# Patient Record
Sex: Female | Born: 1935 | Race: White | Hispanic: No | Smoking: Former smoker
Health system: Southern US, Community
[De-identification: ages and names within clinical notes are randomized; demographics above are authoritative.]

## PROBLEM LIST (undated history)

## (undated) DIAGNOSIS — D539 Nutritional anemia, unspecified: Secondary | ICD-10-CM

## (undated) DIAGNOSIS — Z85820 Personal history of malignant melanoma of skin: Secondary | ICD-10-CM

## (undated) DIAGNOSIS — R569 Unspecified convulsions: Secondary | ICD-10-CM

## (undated) DIAGNOSIS — Z8673 Personal history of transient ischemic attack (TIA), and cerebral infarction without residual deficits: Secondary | ICD-10-CM

## (undated) DIAGNOSIS — G459 Transient cerebral ischemic attack, unspecified: Secondary | ICD-10-CM

## (undated) DIAGNOSIS — Z8589 Personal history of malignant neoplasm of other organs and systems: Secondary | ICD-10-CM

## (undated) DIAGNOSIS — I10 Essential (primary) hypertension: Secondary | ICD-10-CM

## (undated) DIAGNOSIS — M1990 Unspecified osteoarthritis, unspecified site: Secondary | ICD-10-CM

## (undated) DIAGNOSIS — I639 Cerebral infarction, unspecified: Secondary | ICD-10-CM

## (undated) DIAGNOSIS — E7849 Other hyperlipidemia: Secondary | ICD-10-CM

## (undated) HISTORY — DX: Unspecified convulsions: R56.9

## (undated) HISTORY — PX: DENTAL SURGERY: SHX609

## (undated) HISTORY — DX: Personal history of malignant neoplasm of other organs and systems: Z85.89

## (undated) HISTORY — DX: Other hyperlipidemia: E78.49

## (undated) HISTORY — DX: Unspecified osteoarthritis, unspecified site: M19.90

## (undated) HISTORY — PX: SQUAMOUS CELL CARCINOMA EXCISION: SHX2433

## (undated) HISTORY — DX: Personal history of transient ischemic attack (TIA), and cerebral infarction without residual deficits: Z86.73

## (undated) HISTORY — DX: Transient cerebral ischemic attack, unspecified: G45.9

## (undated) HISTORY — DX: Personal history of malignant melanoma of skin: Z85.820

## (undated) HISTORY — PX: CATARACT EXTRACTION: SUR2

## (undated) HISTORY — DX: Nutritional anemia, unspecified: D53.9

## (undated) HISTORY — DX: Cerebral infarction, unspecified: I63.9

---
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
## 2016-10-16 DIAGNOSIS — Z8589 Personal history of malignant neoplasm of other organs and systems: Secondary | ICD-10-CM | POA: Insufficient documentation

## 2017-08-24 DIAGNOSIS — M1990 Unspecified osteoarthritis, unspecified site: Secondary | ICD-10-CM | POA: Insufficient documentation

## 2017-08-24 DIAGNOSIS — Z634 Disappearance and death of family member: Secondary | ICD-10-CM | POA: Insufficient documentation

## 2017-08-24 DIAGNOSIS — IMO0002 Reserved for concepts with insufficient information to code with codable children: Secondary | ICD-10-CM | POA: Insufficient documentation

## 2017-08-24 LAB — HEPATIC FUNCTION PANEL
ALT: 31 (ref 7–35)
AST: 21 (ref 13–35)
Alkaline Phosphatase: 43 (ref 25–125)
Bilirubin, Total: 0.5

## 2017-08-24 LAB — CBC AND DIFFERENTIAL
HCT: 37 (ref 36–46)
Hemoglobin: 11.4 — AB (ref 12.0–16.0)
Platelets: 206 (ref 150–399)
WBC: 9.6

## 2017-08-24 LAB — BASIC METABOLIC PANEL
BUN: 19 (ref 4–21)
CO2: 27 — AB (ref 13–22)
Chloride: 105 (ref 99–108)
Creatinine: 1.2 — AB (ref 0.5–1.1)
Glucose: 85
Potassium: 4.5 (ref 3.4–5.3)
Sodium: 142 (ref 137–147)

## 2017-09-28 ENCOUNTER — Observation Stay (HOSPITAL_COMMUNITY)
Admission: EM | Admit: 2017-09-28 | Discharge: 2017-09-29 | Disposition: A | Discharge status: Home / Self Care | Payer: Medicare Other | Attending: Internal Medicine | Admitting: Internal Medicine

## 2017-09-28 ENCOUNTER — Emergency Department (HOSPITAL_COMMUNITY): Payer: Medicare Other

## 2017-09-28 ENCOUNTER — Other Ambulatory Visit: Payer: Self-pay

## 2017-09-28 ENCOUNTER — Encounter (HOSPITAL_COMMUNITY): Payer: Self-pay | Admitting: Emergency Medicine

## 2017-09-28 ENCOUNTER — Observation Stay (HOSPITAL_COMMUNITY): Payer: Medicare Other

## 2017-09-28 DIAGNOSIS — N1831 Chronic kidney disease, stage 3a: Secondary | ICD-10-CM | POA: Diagnosis present

## 2017-09-28 DIAGNOSIS — Z87891 Personal history of nicotine dependence: Secondary | ICD-10-CM | POA: Insufficient documentation

## 2017-09-28 DIAGNOSIS — R200 Anesthesia of skin: Secondary | ICD-10-CM | POA: Diagnosis present

## 2017-09-28 DIAGNOSIS — I10 Essential (primary) hypertension: Secondary | ICD-10-CM | POA: Diagnosis present

## 2017-09-28 DIAGNOSIS — Z79899 Other long term (current) drug therapy: Secondary | ICD-10-CM | POA: Insufficient documentation

## 2017-09-28 DIAGNOSIS — N183 Chronic kidney disease, stage 3 unspecified: Secondary | ICD-10-CM | POA: Diagnosis present

## 2017-09-28 DIAGNOSIS — G459 Transient cerebral ischemic attack, unspecified: Secondary | ICD-10-CM | POA: Diagnosis not present

## 2017-09-28 DIAGNOSIS — I129 Hypertensive chronic kidney disease with stage 1 through stage 4 chronic kidney disease, or unspecified chronic kidney disease: Secondary | ICD-10-CM | POA: Insufficient documentation

## 2017-09-28 HISTORY — DX: Essential (primary) hypertension: I10

## 2017-09-28 LAB — URINALYSIS, ROUTINE W REFLEX MICROSCOPIC
Bilirubin Urine: NEGATIVE
GLUCOSE, UA: NEGATIVE mg/dL
Hgb urine dipstick: NEGATIVE
Ketones, ur: NEGATIVE mg/dL
LEUKOCYTES UA: NEGATIVE
Nitrite: NEGATIVE
PH: 7 (ref 5.0–8.0)
Protein, ur: NEGATIVE mg/dL
Specific Gravity, Urine: 1.006 (ref 1.005–1.030)

## 2017-09-28 LAB — DIFFERENTIAL
Abs Immature Granulocytes: 0.1 10*3/uL (ref 0.0–0.1)
BASOS ABS: 0.1 10*3/uL (ref 0.0–0.1)
Basophils Relative: 1 %
EOS PCT: 4 %
Eosinophils Absolute: 0.3 10*3/uL (ref 0.0–0.7)
IMMATURE GRANULOCYTES: 1 %
LYMPHS PCT: 23 %
Lymphs Abs: 2.1 10*3/uL (ref 0.7–4.0)
Monocytes Absolute: 0.8 10*3/uL (ref 0.1–1.0)
Monocytes Relative: 8 %
Neutro Abs: 6.1 10*3/uL (ref 1.7–7.7)
Neutrophils Relative %: 63 %

## 2017-09-28 LAB — CBC
HCT: 34.3 % — ABNORMAL LOW (ref 36.0–46.0)
Hemoglobin: 10.8 g/dL — ABNORMAL LOW (ref 12.0–15.0)
MCH: 32 pg (ref 26.0–34.0)
MCHC: 31.5 g/dL (ref 30.0–36.0)
MCV: 101.5 fL — ABNORMAL HIGH (ref 78.0–100.0)
PLATELETS: 131 10*3/uL — AB (ref 150–400)
RBC: 3.38 MIL/uL — ABNORMAL LOW (ref 3.87–5.11)
RDW: 19.1 % — AB (ref 11.5–15.5)
WBC: 9.4 10*3/uL (ref 4.0–10.5)

## 2017-09-28 LAB — COMPREHENSIVE METABOLIC PANEL
ALT: 30 U/L (ref 0–44)
AST: 29 U/L (ref 15–41)
Albumin: 4.6 g/dL (ref 3.5–5.0)
Alkaline Phosphatase: 46 U/L (ref 38–126)
Anion gap: 10 (ref 5–15)
BILIRUBIN TOTAL: 0.8 mg/dL (ref 0.3–1.2)
BUN: 17 mg/dL (ref 8–23)
CALCIUM: 10.2 mg/dL (ref 8.9–10.3)
CO2: 27 mmol/L (ref 22–32)
Chloride: 97 mmol/L — ABNORMAL LOW (ref 98–111)
Creatinine, Ser: 1.09 mg/dL — ABNORMAL HIGH (ref 0.44–1.00)
GFR calc Af Amer: 52 mL/min — ABNORMAL LOW (ref >60)
GFR, EST NON AFRICAN AMERICAN: 45 mL/min — AB (ref >60)
Glucose, Bld: 109 mg/dL — ABNORMAL HIGH (ref 70–99)
Potassium: 4.7 mmol/L (ref 3.5–5.1)
Sodium: 134 mmol/L — ABNORMAL LOW (ref 135–145)
TOTAL PROTEIN: 7.4 g/dL (ref 6.5–8.1)

## 2017-09-28 LAB — RAPID URINE DRUG SCREEN, HOSP PERFORMED
Amphetamines: NOT DETECTED
BENZODIAZEPINES: NOT DETECTED
Cocaine: NOT DETECTED
Opiates: NOT DETECTED
TETRAHYDROCANNABINOL: NOT DETECTED

## 2017-09-28 LAB — I-STAT CHEM 8, ED
BUN: 24 mg/dL — ABNORMAL HIGH (ref 8–23)
CALCIUM ION: 1.04 mmol/L — AB (ref 1.15–1.40)
CREATININE: 0.9 mg/dL (ref 0.44–1.00)
Chloride: 102 mmol/L (ref 98–111)
GLUCOSE: 88 mg/dL (ref 70–99)
HCT: 31 % — ABNORMAL LOW (ref 36.0–46.0)
HEMOGLOBIN: 10.5 g/dL — AB (ref 12.0–15.0)
Potassium: 5.5 mmol/L — ABNORMAL HIGH (ref 3.5–5.1)
Sodium: 133 mmol/L — ABNORMAL LOW (ref 135–145)
TCO2: 23 mmol/L (ref 22–32)

## 2017-09-28 LAB — CBG MONITORING, ED: GLUCOSE-CAPILLARY: 95 mg/dL (ref 70–99)

## 2017-09-28 LAB — TSH: TSH: 0.533 u[IU]/mL (ref 0.350–4.500)

## 2017-09-28 LAB — APTT: aPTT: 36 seconds (ref 24–36)

## 2017-09-28 LAB — I-STAT TROPONIN, ED: TROPONIN I, POC: 0 ng/mL (ref 0.00–0.08)

## 2017-09-28 LAB — PROTIME-INR
INR: 1.03
Prothrombin Time: 13.5 seconds (ref 11.4–15.2)

## 2017-09-28 MED ORDER — ASPIRIN 325 MG PO TABS
325.0000 mg | ORAL_TABLET | Freq: Every day | ORAL | Status: DC
  Administered 2017-09-29: 325 mg via ORAL
  Filled 2017-09-28: qty 1

## 2017-09-28 MED ORDER — ACETAMINOPHEN 650 MG RE SUPP
650.0000 mg | RECTAL | Status: DC | PRN
Start: 2017-09-28 — End: 2017-09-29

## 2017-09-28 MED ORDER — STROKE: EARLY STAGES OF RECOVERY BOOK
Freq: Once | Status: AC
  Administered 2017-09-28: 20:00:00
  Filled 2017-09-28: qty 1

## 2017-09-28 MED ORDER — ATORVASTATIN CALCIUM 80 MG PO TABS
80.0000 mg | ORAL_TABLET | Freq: Every day | ORAL | Status: DC
  Administered 2017-09-28: 80 mg via ORAL
  Filled 2017-09-28: qty 1

## 2017-09-28 MED ORDER — ACETAMINOPHEN 160 MG/5ML PO SOLN: 650.0000 mg | ORAL | Status: DC | PRN

## 2017-09-28 MED ORDER — ACETAMINOPHEN 325 MG PO TABS: 650.0000 mg | ORAL_TABLET | ORAL | Status: DC | PRN

## 2017-09-28 MED ORDER — CLOPIDOGREL BISULFATE 75 MG PO TABS
75.0000 mg | ORAL_TABLET | Freq: Every day | ORAL | Status: DC
  Administered 2017-09-29: 75 mg via ORAL
  Filled 2017-09-28: qty 1

## 2017-09-28 MED ORDER — SENNOSIDES-DOCUSATE SODIUM 8.6-50 MG PO TABS: 1.0000 | ORAL_TABLET | Freq: Every evening | ORAL | Status: DC | PRN

## 2017-09-28 MED ORDER — CLOPIDOGREL BISULFATE 75 MG PO TABS
300.0000 mg | ORAL_TABLET | Freq: Once | ORAL | Status: AC
  Administered 2017-09-28: 300 mg via ORAL
  Filled 2017-09-28: qty 4

## 2017-09-28 MED ORDER — ENOXAPARIN SODIUM 30 MG/0.3ML ~~LOC~~ SOLN
30.0000 mg | SUBCUTANEOUS | Status: DC
  Administered 2017-09-28: 30 mg via SUBCUTANEOUS
  Filled 2017-09-28: qty 0.3

## 2017-09-28 MED ORDER — LORAZEPAM 2 MG/ML IJ SOLN
0.5000 mg | Freq: Once | INTRAMUSCULAR | Status: AC
  Administered 2017-09-28: 0.5 mg via INTRAVENOUS
  Filled 2017-09-28: qty 1

## 2017-09-28 MED ORDER — SODIUM CHLORIDE 0.9 % IV SOLN
INTRAVENOUS | Status: DC
  Administered 2017-09-28: 21:00:00 via INTRAVENOUS

## 2017-09-28 NOTE — Consult Note (Signed)
NEURO HOSPITALIST CONSULT NOTE   Requestig physician: Dr. Ellender Hose   Reason for Consult: right arm and hand numbness, slurred speech, right facial numbness   History obtained from:  Patient    HPI:                                                                                                                                          Sarah Hampton is an 84 y.o. female PMH of HTN and skin cancer (SCC) who presents to [facility] for right face and arm numbness, slurred speech.  Patient states that she has had right arm numbness off and on for several months.Usually the tingling is just in her pointer and middle finger, and there is some numbness of the hand. She is visiting her daughter and since Thursday has been having transient episodes of right arm numbness and  Hand tingling for quite some time. Friday she reports that her right arm was numb/ weak ( she felt as if she lost control of her arm for a minute) and she dropped a coffee cup. These episodes last about  15 minutes, and there is never any leg involvement.   This morning about 11:00am she had an episode where her entire arm was numb and her face was also numb. There was no tingling. This time she also had trouble speaking. Her daughter states that she noticed some slurred speech that has almost completely resolved. Denies any CP, SOB, vision problems, facial droop, dizziness, difficulty walking, HA, or falls. Denies starring spells, HA (except when the weather changes).  Patient stopped taking ASA in the past month at the recommendation of her PCP. Does state that she has been having right neck pain and stiffness since about Wednesday when she thinks she " over did it" at the [facility].  CT head:no acute abnormality. BG: 109, NA: 134, creatinine 1.09, GFR: 45, toxicology screen: negative.  1a Level of Conscious:0 1b LOC Questions: 0 1c LOC Commands: 0 2 Best Gaze: 0 3 Visual: 0 4 Facial Palsy: 0 5a Motor Arm - left:0  5b  Motor Arm - Right: 0 6a Motor Leg - Left: 0 6b Motor Leg - Right: 0 7 Limb Ataxia: 0 8 Sensory:0  9 Best Language: 0 10 Dysarthria:0 11 Extinct. and Inattention:0 TOTAL: 0   No previous stroke history  Past Medical History:  Diagnosis Date  . Hypertension     FHx: No hx stroke    Social History:  reports that she quit smoking about 49 years ago. Her smoking use included cigarettes. She does not have any smokeless tobacco history on file. She reports that she drank alcohol. She reports that she does not use drugs.  No Known Allergies  MEDICATIONS:  No current facility-administered medications for this encounter.    Current Outpatient Medications  Medication Sig Dispense Refill  . calcium-vitamin D (OSCAL WITH D) 500-200 MG-UNIT tablet Take 1 tablet by mouth daily with breakfast.    . lisinopril (PRINIVIL,ZESTRIL) 20 MG tablet Take 20 mg by mouth daily.    . Multiple Vitamins-Minerals (MULTIVITAL) tablet Take 1 tablet by mouth daily.        ROS:                                                                                                                                       History obtained from the patient  General ROS: negative for - chills, fatigue, fever, night sweats, weight gain or weight loss Ophthalmic ROS: negative for - blurry vision, double vision, eye pain or loss of vision Respiratory ROS: negative for - cough, hemoptysis, shortness of breath or wheezing Cardiovascular ROS: negative for - chest pain, dyspnea on exertion, edema or irregular heartbeat Gastrointestinal ROS: negative for - abdominal pain, diarrhea, hematemesis, nausea/vomiting or stool incontinence Musculoskeletal ROS: positive for - right arm numbness and tingling, questionable transient weakness Neurological ROS: as noted in HPI Dermatological ROS: negative for rash and skin  lesion changes   Blood pressure (!) 132/46, pulse 71, temperature 97.9 F (36.6 C), resp. rate (!) 21, height 4\' 11"  (1.499 m), weight 47.6 kg (105 lb), SpO2 99 %.   General Examination:                                                                                                       Physical Exam  HEENT-  Normocephalic, no lesions, without obvious abnormality.  Normal external eye and conjunctiva.   Cardiovascular- S1-S2 audible, pulses palpable throughout   Lungs-no rhonchi or wheezing noted, no excessive working breathing.  Saturations within normal limits on RA Extremities- Warm, dry and intact Musculoskeletal-no joint tenderness, deformity or swelling Skin-warm and dry, intact old scars on RLE from [facility] and skin graft.  Neurological Examination Mental Status: Alert, oriented, to person/place/month/year.  Speech fluent without evidence of aphasia.  Able to follow commands without difficulty. Cranial Nerves: II:  Visual fields grossly normal,  III,IV, VI: ptosis not present, extra-ocular motions intact bilaterally pupils equal, round, reactive to light and accommodation V,VII: smile symmetric, facial light touch sensation normal bilaterally VIII: hearing normal bilaterally IX,X: uvula rises symmetrically XI: bilateral shoulder shrug XII: midline tongue extension Motor: Right : Upper extremity   5/5    Left:  Upper extremity   5/5  Lower extremity   5/5     Lower extremity   5/5 Tone and bulk:normal tone throughout; no atrophy noted Of note patient gets muscle spasms in bilateral quadriceps muscles which cause her to drop her leg back to bed. Sensory:  light touch/ cool temp intact throughout, bilaterally Deep Tendon Reflexes: 2+ biceps, patallar Plantars: Right: downgoing   Left: downgoing Cerebellar: normal finger-to-nose, normal rapid alternating movements and normal heel-to-shin test Gait: deferred   Lab Results: Basic Metabolic Panel: Recent Labs  Lab  09/28/17 1238 09/28/17 1335  NA 133* 134*  K 5.5* 4.7  CL 102 97*  CO2  --  27  GLUCOSE 88 109*  BUN 24* 17  CREATININE 0.90 1.09*  CALCIUM  --  10.2    CBC: Recent Labs  Lab 09/28/17 1221 09/28/17 1238  WBC 9.4  --   NEUTROABS 6.1  --   HGB 10.8* 10.5*  HCT 34.3* 31.0*  MCV 101.5*  --   PLT 131*  --     Cardiac Enzymes: No results for input(s): CKTOTAL, CKMB, CKMBINDEX, TROPONINI in the last 168 hours.  Lipid Panel: No results for input(s): CHOL, TRIG, HDL, CHOLHDL, VLDL, LDLCALC in the last 168 hours.  Imaging: Ct Head Wo Contrast  Result Date: 09/28/2017 CLINICAL DATA:  Onset of right arm and hand numbness yesterday morning which resolved. The patient also had a 20 minutes episode of slurred speech yesterday morning. EXAM: CT HEAD WITHOUT CONTRAST CT CERVICAL SPINE WITHOUT CONTRAST TECHNIQUE: Multidetector CT imaging of the head and cervical spine was performed following the standard protocol without intravenous contrast. Multiplanar CT image reconstructions of the cervical spine were also generated. COMPARISON:  None. FINDINGS: CT HEAD FINDINGS Brain: No evidence of acute infarction, hemorrhage, hydrocephalus, extra-axial collection or mass lesion/mass effect. Chronic microvascular ischemic change is noted. Vascular: Atherosclerotic vascular disease is identified. Skull: Intact. Sinuses/Orbits: No acute finding. Status post bilateral lens extraction. Other: None. CT CERVICAL SPINE FINDINGS Alignment: Maintain with reversal of lordosis noted. Skull base and vertebrae: No acute fracture. No primary bone lesion or focal pathologic process. 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 Soft tissues and spinal canal: No prevertebral fluid or swelling. No visible canal hematoma. Disc levels: Marked loss of disc space height is seen from C2-C7. The patient has a capacious appearing central spinal canal. Scattered foraminal narrowing is noted. Upper chest:  Clear. Other: None. IMPRESSION: No acute abnormality head or cervical spine. Chronic microvascular ischemic change. Atherosclerosis. Cervical degenerative disease. Electronically Signed   By: Inge Rise M.D.   On: 09/28/2017 14:22   Ct Cervical Spine Wo Contrast  Result Date: 09/28/2017 CLINICAL DATA:  Onset of right arm and hand numbness yesterday morning which resolved. The patient also had a 20 minutes episode of slurred speech yesterday morning. EXAM: CT HEAD WITHOUT CONTRAST CT CERVICAL SPINE WITHOUT CONTRAST TECHNIQUE: Multidetector CT imaging of the head and cervical spine was performed following the standard protocol without intravenous contrast. Multiplanar CT image reconstructions of the cervical spine were also generated. COMPARISON:  None. FINDINGS: CT HEAD FINDINGS Brain: No evidence of acute infarction, hemorrhage, hydrocephalus, extra-axial collection or mass lesion/mass effect. Chronic microvascular ischemic change is noted. Vascular: Atherosclerotic vascular disease is identified. Skull: Intact. Sinuses/Orbits: No acute finding. Status post bilateral lens extraction. Other: None. CT CERVICAL SPINE FINDINGS Alignment: Maintain with reversal of lordosis noted. Skull base and vertebrae: No acute fracture. No primary bone lesion or focal  pathologic process. 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 Soft tissues and spinal canal: No prevertebral fluid or swelling. No visible canal hematoma. Disc levels: Marked loss of disc space height is seen from C2-C7. The patient has a capacious appearing central spinal canal. Scattered foraminal narrowing is noted. Upper chest: Clear. Other: None. IMPRESSION: No acute abnormality head or cervical spine. Chronic microvascular ischemic change. Atherosclerosis. Cervical degenerative disease. Electronically Signed   By: Inge Rise M.D.   On: 09/28/2017 14:22   Laurey Morale, MSN, NP-C [facility] [phone]  Attending neurologist's note to follow     Impression: Sarah Hampton is an 84 y.o. female with PMH of HTN who presents to [facility] for right face and arm numbness. I feel that this was a separate event form the intermittent tingling that has been longstanding and is concerning for carpal tunnel. The event today is most consistent with TIA and I would treat it as such.   Recommendations: -- BP goal : Permissive HTN up to 220/110 mmHg  --MRI Brain  --MRA of the head w/o and neck with contrast  --Echocardiogram --Load with Plavix 300 mg one time dose today --  Start 09/29/17 ASA 325 mg daily and Plavix 75 mg daily for 3 weeks, and then stop Plavix and continue ASA -- High intensity Statin for LDL > 70 -- HgbA1c, fasting lipid panel -- PT consult, OT consult, Speech consult --Telemetry monitoring --Frequent neuro checks --Stroke swallow screen (completed in ED) --please page stroke NP  Or  PA  Or MD from 8am -4 pm  as this patient from this time will be  followed by the stroke.   You can look them up on [email or web address]  Password [email or web address]   Roland Rack, MD [facility] [phone]  If 7pm- 7am, please page neurology on call as listed in [place].  09/28/2017, 3:51 PM

## 2017-09-28 NOTE — ED Notes (Signed)
Patient transported to CT 

## 2017-09-28 NOTE — ED Notes (Signed)
Pt back from CT

## 2017-09-28 NOTE — ED Provider Notes (Signed)
[facility]PART[facility] Provider Note   CSN: [id number] Arrival date & time: 09/28/17  1205     History   Chief Complaint Chief Complaint  Patient presents with  . Numbness    HPI Sarah Hampton is a 84 y.o. female.  HPI 84 year old female here with right sided facial numbness and difficulty speaking.  The patient states that over the last several weeks, she has had intermittent right arm numbness.  She has seen her doctor for this.  She recently moved to the area.  She states that earlier today, she woke and noticed numbness of her right arm.  She also had transient right sided facial numbness and slurred speech.  She felt like she was trying to say the right words, but that it was "coming out wrong."  She denies any associated visual changes.  No difficulty swallowing.  Symptoms are now largely resolved.  Her numbness has been coming and going, but her facial numbness and difficulty speaking is new.  No recent falls.  No headache.  No recent medication change.  No history of stroke.  Patient is amatory very independent at her baseline.  She is currently moving to the area to be placed in an independent living facility.  Past Medical History:  Diagnosis Date  . Hypertension     Patient Active Problem List   Diagnosis Date Noted  . TIA (transient ischemic attack) 09/28/2017  . Essential hypertension 09/28/2017  . CKD (chronic kidney disease), stage III ([place]) 09/28/2017    Past Surgical History:  Procedure Laterality Date  . SQUAMOUS CELL CARCINOMA EXCISION       OB History   None      Home Medications    Prior to Admission medications   Medication Sig Start Date End Date Taking? Authorizing Provider  calcium-vitamin D (OSCAL WITH D) 500-200 MG-UNIT tablet Take 1 tablet by mouth daily with breakfast.   Yes [provider]  lisinopril (PRINIVIL,ZESTRIL) 20 MG tablet Take 20 mg by mouth daily.   Yes [provider]  Multiple  Vitamins-Minerals (MULTIVITAL) tablet Take 1 tablet by mouth daily.   Yes [provider]    Family History Family History  Problem Relation Age of Onset  . Dementia Mother 51  . Pneumonia Father 80  . Stroke Neg Hx     Social History Social History   Tobacco Use  . Smoking status: Former Smoker    Packs/day: 0.50    Years: 20.00    Pack years: 10.00    Types: Cigarettes    Last attempt to quit: 09/28/1968    Years since quitting: 49.0  . Smokeless tobacco: Never Used  Substance Use Topics  . Alcohol use: Yes    Comment: rare  . Drug use: Never     Allergies   Patient has no known allergies.   Review of Systems Review of Systems  Constitutional: Negative for chills and fever.  HENT: Negative for congestion, rhinorrhea and sore throat.   Eyes: Negative for visual disturbance.  Respiratory: Negative for cough, shortness of breath and wheezing.   Cardiovascular: Negative for chest pain and leg swelling.  Gastrointestinal: Negative for abdominal pain, diarrhea, nausea and vomiting.  Genitourinary: Negative for dysuria, flank pain, vaginal bleeding and vaginal discharge.  Musculoskeletal: Negative for neck pain.  Skin: Negative for rash.  Allergic/Immunologic: Negative for immunocompromised state.  Neurological: Positive for facial asymmetry, speech difficulty and numbness. Negative for syncope and headaches.  Hematological: Does not bruise/bleed  easily.  All other systems reviewed and are negative.    Physical Exam Updated Vital Signs BP (!) 117/42 (BP Location: Right Arm)   Pulse 77   Temp 98 F (36.7 C) (Oral)   Resp 18   Ht 4\' 11"  (1.499 m)   Wt 47.6 kg (105 lb)   SpO2 98%   BMI 21.21 kg/m   Physical Exam  Constitutional: She is oriented to person, place, and time. She appears well-developed and well-nourished. No distress.  HENT:  Head: Normocephalic and atraumatic.  Eyes: Conjunctivae are normal.  Neck: Neck supple.  Cardiovascular:  Normal rate, regular rhythm and normal heart sounds. Exam reveals no friction rub.  No murmur heard. Pulmonary/Chest: Effort normal and breath sounds normal. No respiratory distress. She has no wheezes. She has no rales.  Abdominal: She exhibits no distension.  Musculoskeletal: She exhibits no edema.  Neurological: She is alert and oriented to person, place, and time. She exhibits normal muscle tone.  Skin: Skin is warm. Capillary refill takes less than 2 seconds.  Psychiatric: She has a normal mood and affect.  Nursing note and vitals reviewed.   Neurological Exam:  Mental Status: Alert and oriented to person, place, and time. Attention and concentration normal. Speech clear. Recent memory is intact. Cranial Nerves: Visual fields grossly intact. EOMI and PERRLA. No nystagmus noted. Facial sensation intact at forehead, maxillary cheek, and chin/mandible bilaterally. No facial asymmetry or weakness. Hearing grossly normal. Uvula is midline, and palate elevates symmetrically. Normal SCM and trapezius strength. Tongue midline without fasciculations. Motor: Muscle strength 5/5 in proximal and distal UE and LE bilaterally. No pronator drift. Muscle tone normal. Reflexes: 2+ and symmetrical in all four extremities.  Sensation: Intact to light touch in upper and lower extremities distally bilaterally.  Gait: Normal without ataxia. Coordination: Normal FTN bilaterally.     ED Treatments / Results  Labs (all labs ordered are listed, but only abnormal results are displayed) Labs Reviewed  CBC - Abnormal; Notable for the following components:      Result Value   RBC 3.38 (*)    Hemoglobin 10.8 (*)    HCT 34.3 (*)    MCV 101.5 (*)    RDW 19.1 (*)    Platelets 131 (*)    All other components within normal limits  RAPID URINE DRUG SCREEN, HOSP PERFORMED - Abnormal; Notable for the following components:   Barbiturates   (*)    Value: Result not available. Reagent lot number recalled by  manufacturer.   All other components within normal limits  URINALYSIS, ROUTINE W REFLEX MICROSCOPIC - Abnormal; Notable for the following components:   APPearance HAZY (*)    All other components within normal limits  COMPREHENSIVE METABOLIC PANEL - Abnormal; Notable for the following components:   Sodium 134 (*)    Chloride 97 (*)    Glucose, Bld 109 (*)    Creatinine, Ser 1.09 (*)    GFR calc non Af Amer 45 (*)    GFR calc Af Amer 52 (*)    All other components within normal limits  I-STAT CHEM 8, ED - Abnormal; Notable for the following components:   Sodium 133 (*)    Potassium 5.5 (*)    BUN 24 (*)    Calcium, Ion 1.04 (*)    Hemoglobin 10.5 (*)    HCT 31.0 (*)    All other components within normal limits  DIFFERENTIAL  PROTIME-INR  APTT  TSH  HEMOGLOBIN A1C  LIPID PANEL  CBG MONITORING, ED  I-STAT TROPONIN, ED    EKG EKG Interpretation  Date/Time:  Tuesday September 28 2017 12:16:12 EDT Ventricular Rate:  72 PR Interval:    QRS Duration: 87 QT Interval:  382 QTC Calculation: 418 R Axis:   57 Text Interpretation:  Sinus rhythm Consider left ventricular hypertrophy No old tracing to compare Confirmed by Duffy Bruce [phone]) on 09/28/2017 12:58:43 PM   Radiology Ct Head Wo Contrast  Result Date: 09/28/2017 CLINICAL DATA:  Onset of right arm and hand numbness yesterday morning which resolved. The patient also had a 20 minutes episode of slurred speech yesterday morning. EXAM: CT HEAD WITHOUT CONTRAST CT CERVICAL SPINE WITHOUT CONTRAST TECHNIQUE: Multidetector CT imaging of the head and cervical spine was performed following the standard protocol without intravenous contrast. Multiplanar CT image reconstructions of the cervical spine were also generated. COMPARISON:  None. FINDINGS: CT HEAD FINDINGS Brain: No evidence of acute infarction, hemorrhage, hydrocephalus, extra-axial collection or mass lesion/mass effect. Chronic microvascular ischemic change is noted. Vascular:  Atherosclerotic vascular disease is identified. Skull: Intact. Sinuses/Orbits: No acute finding. Status post bilateral lens extraction. Other: None. CT CERVICAL SPINE FINDINGS Alignment: Maintain with reversal of lordosis noted. Skull base and vertebrae: No acute fracture. No primary bone lesion or focal pathologic process. 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 Soft tissues and spinal canal: No prevertebral fluid or swelling. No visible canal hematoma. Disc levels: Marked loss of disc space height is seen from C2-C7. The patient has a capacious appearing central spinal canal. Scattered foraminal narrowing is noted. Upper chest: Clear. Other: None. IMPRESSION: No acute abnormality head or cervical spine. Chronic microvascular ischemic change. Atherosclerosis. Cervical degenerative disease. Electronically Signed   By: Inge Rise M.D.   On: 09/28/2017 14:22   Ct Cervical Spine Wo Contrast  Result Date: 09/28/2017 CLINICAL DATA:  Onset of right arm and hand numbness yesterday morning which resolved. The patient also had a 20 minutes episode of slurred speech yesterday morning. EXAM: CT HEAD WITHOUT CONTRAST CT CERVICAL SPINE WITHOUT CONTRAST TECHNIQUE: Multidetector CT imaging of the head and cervical spine was performed following the standard protocol without intravenous contrast. Multiplanar CT image reconstructions of the cervical spine were also generated. COMPARISON:  None. FINDINGS: CT HEAD FINDINGS Brain: No evidence of acute infarction, hemorrhage, hydrocephalus, extra-axial collection or mass lesion/mass effect. Chronic microvascular ischemic change is noted. Vascular: Atherosclerotic vascular disease is identified. Skull: Intact. Sinuses/Orbits: No acute finding. Status post bilateral lens extraction. Other: None. CT CERVICAL SPINE FINDINGS Alignment: Maintain with reversal of lordosis noted. Skull base and vertebrae: No acute fracture. No primary bone lesion or  focal pathologic process. 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 Soft tissues and spinal canal: No prevertebral fluid or swelling. No visible canal hematoma. Disc levels: Marked loss of disc space height is seen from C2-C7. The patient has a capacious appearing central spinal canal. Scattered foraminal narrowing is noted. Upper chest: Clear. Other: None. IMPRESSION: No acute abnormality head or cervical spine. Chronic microvascular ischemic change. Atherosclerosis. Cervical degenerative disease. Electronically Signed   By: Inge Rise M.D.   On: 09/28/2017 14:22   Mr Brain Wo Contrast  Result Date: 09/28/2017 CLINICAL DATA:  84 y/o F; episode of right arm and face numbness with difficulty speaking. EXAM: MRI HEAD WITHOUT CONTRAST MRA HEAD WITHOUT CONTRAST TECHNIQUE: Multiplanar, multiecho pulse sequences of the brain and surrounding structures were obtained without intravenous contrast. Angiographic images of the  head were obtained using MRA technique without contrast. COMPARISON:  09/28/2016 CT head. FINDINGS: MRI HEAD FINDINGS Brain: No acute infarction, hemorrhage, hydrocephalus, extra-axial collection or mass lesion. Numerous nonspecific foci of T2 FLAIR hyperintense signal abnormality in subcortical and periventricular white matter are compatible with moderate chronic microvascular ischemic changes for age. Moderate brain parenchymal volume loss. Vascular: Normal flow voids. Skull and upper cervical spine: Normal marrow signal. Sinuses/Orbits: Negative. 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 MRA HEAD FINDINGS Internal carotid arteries: Patent. Irregularity of the carotid siphons bilaterally compatible with atherosclerotic disease. Bilateral cavernous segments are ectatic to 6 mm. Anterior cerebral arteries:  Patent. Middle cerebral arteries: Patent. Anterior communicating artery: Patent. Posterior communicating arteries:  Patent. Posterior  cerebral arteries:  Patent. Basilar artery:  Patent. Vertebral arteries:  Patent. No evidence of high-grade stenosis, large vessel occlusion, or aneurysm identified. IMPRESSION: MRI head: 1. No acute intracranial abnormality identified. 2. Moderate chronic microvascular ischemic changes and parenchymal volume loss of the brain. MRA head: 1. Patent anterior and posterior intracranial circulation. No large vessel occlusion, high-grade stenosis, or aneurysm. 2. Atherosclerotic irregularity of carotid siphons with mild ectasia of cavernous segments. Electronically Signed   By: Kristine Garbe M.D.   On: 09/28/2017 21:07   Mr Jodene Nam Head Wo Contrast  Result Date: 09/28/2017 CLINICAL DATA:  84 y/o F; episode of right arm and face numbness with difficulty speaking. EXAM: MRI HEAD WITHOUT CONTRAST MRA HEAD WITHOUT CONTRAST TECHNIQUE: Multiplanar, multiecho pulse sequences of the brain and surrounding structures were obtained without intravenous contrast. Angiographic images of the head were obtained using MRA technique without contrast. COMPARISON:  09/28/2016 CT head. FINDINGS: MRI HEAD FINDINGS Brain: No acute infarction, hemorrhage, hydrocephalus, extra-axial collection or mass lesion. Numerous nonspecific foci of T2 FLAIR hyperintense signal abnormality in subcortical and periventricular white matter are compatible with moderate chronic microvascular ischemic changes for age. Moderate brain parenchymal volume loss. Vascular: Normal flow voids. Skull and upper cervical spine: Normal marrow signal. Sinuses/Orbits: Negative. 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 MRA HEAD FINDINGS Internal carotid arteries: Patent. Irregularity of the carotid siphons bilaterally compatible with atherosclerotic disease. Bilateral cavernous segments are ectatic to 6 mm. Anterior cerebral arteries:  Patent. Middle cerebral arteries: Patent. Anterior communicating artery: Patent. Posterior communicating  arteries:  Patent. Posterior cerebral arteries:  Patent. Basilar artery:  Patent. Vertebral arteries:  Patent. No evidence of high-grade stenosis, large vessel occlusion, or aneurysm identified. IMPRESSION: MRI head: 1. No acute intracranial abnormality identified. 2. Moderate chronic microvascular ischemic changes and parenchymal volume loss of the brain. MRA head: 1. Patent anterior and posterior intracranial circulation. No large vessel occlusion, high-grade stenosis, or aneurysm. 2. Atherosclerotic irregularity of carotid siphons with mild ectasia of cavernous segments. Electronically Signed   By: Kristine Garbe M.D.   On: 09/28/2017 21:07    Procedures Procedures (including critical care time)  Medications Ordered in ED Medications  clopidogrel (PLAVIX) tablet 75 mg (has no administration in time range)  aspirin tablet 325 mg (has no administration in time range)  enoxaparin (LOVENOX) injection 30 mg (30 mg Subcutaneous Given 09/28/17 2117)  0.9 %  sodium chloride infusion ( Intravenous New Bag/Given 09/28/17 2112)  acetaminophen (TYLENOL) tablet 650 mg (has no administration in time range)    Or  acetaminophen (TYLENOL) solution 650 mg (has no administration in time range)    Or  acetaminophen (TYLENOL) suppository 650 mg (has no administration in time range)  senna-docusate (Senokot-S) tablet 1 tablet (has no administration  in time range)  atorvastatin (LIPITOR) tablet 80 mg (80 mg Oral Given 09/28/17 2114)  clopidogrel (PLAVIX) tablet 300 mg (300 mg Oral Given 09/28/17 1942)   stroke: mapping our early stages of recovery book ( Does not apply Given 09/28/17 1952)  LORazepam (ATIVAN) injection 0.5 mg (0.5 mg Intravenous Given 09/28/17 1945)     Initial Impression / Assessment and Plan / ED Course  I have reviewed the triage vital signs and the nursing notes.  Pertinent labs & imaging results that were available during my care of the patient were reviewed by me and considered in my  medical decision making (see chart for details).     84 year old female here with transient right facial numbness, difficulty speaking, and right arm numbness.  Clinically, concern for possible TIA.  Her right arm numbness, however, has been coming and going I suspect this more so secondary to cervical radiculopathy.  CT head negative and CT C-spine does show degenerative changes, but this would not explain her difficulty speaking and facial numbness.  Discussed with neurology Dr. Leonel Ramsay, will admit for TIA work-up.  Final Clinical Impressions(s) / ED Diagnoses   Final diagnoses:  TIA (transient ischemic attack)    ED Discharge Orders    None       Duffy Bruce, MD 09/28/17 2330

## 2017-09-28 NOTE — ED Triage Notes (Signed)
Patient arrived from home. Complaints of incident of right arm numbness occurring yesterday morning which resolve. Symptoms of right arm numbness with right hand numbness returned. Also endorses slurred speech. Incident last 20 minutes. Denies headache or chest pain. History of hypertension.

## 2017-09-28 NOTE — ED Notes (Addendum)
Pt assisted to restroom x1 person assist

## 2017-09-28 NOTE — ED Notes (Signed)
Patient up to bathroom

## 2017-09-28 NOTE — ED Notes (Signed)
Kirkpatrick, MD at bedside.  

## 2017-09-28 NOTE — ED Notes (Addendum)
ED Provider at bedside. Hospitalist [facility]

## 2017-09-28 NOTE — Progress Notes (Signed)
Pt admitted to 3W34 at this time.  Alert and oriented.  Daughter at bedside.  Denies any pain.  Per patient and daughter, right arm and hand numbness and slurred speech has resolved.  Placed on telemetry box 25. CCMD called and verified.  Bed alarm set and call bell within reach.  Pt verbalizes understanding to call before attempting to get out of bed.  Daughter states she will take home patient's hearing aids, cell phone and all other valuables. Will leave her eyeglasses with patient.

## 2017-09-28 NOTE — H&P (Signed)
History and Physical    Sarah Hampton AYT:[id number] DOB: [birth year] DOA: 09/28/2017  PCP:   Philipp Ovens,  NP - lives in [place] Consultants:  None Patient coming from: Home - lives in [place]; she may be moving here in [employer], very active; NOK:  Daughter, [phone]  Chief Complaint:  Neurologic symptoms  HPI: Sarah Hampton is a 84 y.o. female with medical history significant of HTN presenting with transient neurologic symptoms.  She has chronic R 4/5 finger numbness.  Today, her daughter was leaving the house and the patient asked her to bring her to the hospital.  She complained of numbness in her right arm and right face and difficulty speaking.  Her speech disturbance may still be a bit present but is mild.  She has had tingling in her fingers over a couple of months.  She did feel a neck/shoulder strain last week after exercising.  She dropped a coffee cup on Saturday after going to the [facility] on Friday.  She did not feel dizzy and was able to walk.  Symptoms lasted less than 5 minutes today.   ED Course:  TIA work-up.  Intermittent R arm numbness, chronic.  Today with speech difficulty, resolved.  Independent.  Neurology to see.  Suggest TIA eval.  Review of Systems: As per HPI; otherwise review of systems reviewed and negative.   Ambulatory Status:  Ambulates without assistance  Past Medical History:  Diagnosis Date  . Hypertension     Past Surgical History:  Procedure Laterality Date  . SQUAMOUS CELL CARCINOMA EXCISION      Social History   Socioeconomic History  . Marital status: Single    Spouse name: Not on file  . Number of children: Not on file  . Years of education: Not on file  . Highest education level: Not on file  Occupational History  . Occupation: retired  Scientific laboratory technician  . Financial resource strain: Not on file  . Food insecurity:    Worry: Not on file    Inability: Not on file  . Transportation needs:    Medical: Not on file   Non-medical: Not on file  Tobacco Use  . Smoking status: Former Smoker    Packs/day: 0.50    Years: 20.00    Pack years: 10.00    Types: Cigarettes    Last attempt to quit: 09/28/1968    Years since quitting: 49.0  . Smokeless tobacco: Never Used  Substance and Sexual Activity  . Alcohol use: Yes    Comment: rare  . Drug use: Never  . Sexual activity: Not on file  Lifestyle  . Physical activity:    Days per week: Not on file    Minutes per session: Not on file  . Stress: Not on file  Relationships  . Social connections:    Talks on phone: Not on file    Gets together: Not on file    Attends religious service: Not on file    Active member of club or organization: Not on file    Attends meetings of clubs or organizations: Not on file    Relationship status: Not on file  . Intimate partner violence:    Fear of current or ex partner: Not on file    Emotionally abused: Not on file    Physically abused: Not on file    Forced sexual activity: Not on file  Other Topics Concern  . Not on file  Social History Narrative  . Not on file  No Known Allergies  Family History  Problem Relation Age of Onset  . Dementia Mother 57  . Pneumonia Father 46  . Stroke Neg Hx     Prior to Admission medications   Medication Sig Start Date End Date Taking? Authorizing Provider  calcium-vitamin D (OSCAL WITH D) 500-200 MG-UNIT tablet Take 1 tablet by mouth daily with breakfast.   Yes [provider]  lisinopril (PRINIVIL,ZESTRIL) 20 MG tablet Take 20 mg by mouth daily.   Yes [provider]  Multiple Vitamins-Minerals (MULTIVITAL) tablet Take 1 tablet by mouth daily.   Yes [provider]    Physical Exam: Vitals:   09/28/17 1630 09/28/17 1634 09/28/17 1645 09/28/17 1839  BP: (!) 153/57 (!) 143/53 (!) 149/52 (!) 147/69  Pulse: 75 78 78 71  Resp: 20 17 18 16   Temp:    98.5 F (36.9 C)  TempSrc:    Oral  SpO2: 100% 98% 100% 100%  Weight:      Height:           General: Appears calm and comfortable and is NAD Eyes:  PERRL, EOMI, normal lids, iris ENT:  grossly normal hearing, lips & tongue, mmm Neck:  no LAD, masses or thyromegaly; no carotid bruits Cardiovascular:  RRR, no m/r/g. No LE edema.  Respiratory:   CTA bilaterally with no wheezes/rales/rhonchi.  Normal respiratory effort. Abdomen:  soft, NT, ND, NABS Back:   normal alignment, no CVAT Skin:  no rash or induration seen on limited exam Musculoskeletal:  grossly normal tone BUE/BLE, good ROM, no bony abnormality Lower extremity:  No LE edema.  Limited foot exam with no ulcerations.  2+ distal pulses. Psychiatric:  grossly normal mood and affect, speech fluent and appropriate, AOx3 Neurologic:  CN 2-12 grossly intact, moves all extremities in coordinated fashion, sensation intact    Radiological Exams on Admission: Ct Head Wo Contrast  Result Date: 09/28/2017 CLINICAL DATA:  Onset of right arm and hand numbness yesterday morning which resolved. The patient also had a 20 minutes episode of slurred speech yesterday morning. EXAM: CT HEAD WITHOUT CONTRAST CT CERVICAL SPINE WITHOUT CONTRAST TECHNIQUE: Multidetector CT imaging of the head and cervical spine was performed following the standard protocol without intravenous contrast. Multiplanar CT image reconstructions of the cervical spine were also generated. COMPARISON:  None. FINDINGS: CT HEAD FINDINGS Brain: No evidence of acute infarction, hemorrhage, hydrocephalus, extra-axial collection or mass lesion/mass effect. Chronic microvascular ischemic change is noted. Vascular: Atherosclerotic vascular disease is identified. Skull: Intact. Sinuses/Orbits: No acute finding. Status post bilateral lens extraction. Other: None. CT CERVICAL SPINE FINDINGS Alignment: Maintain with reversal of lordosis noted. Skull base and vertebrae: No acute fracture. No primary bone lesion or focal pathologic process. 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 Soft tissues and spinal canal: No prevertebral fluid or swelling. No visible canal hematoma. Disc levels: Marked loss of disc space height is seen from C2-C7. The patient has a capacious appearing central spinal canal. Scattered foraminal narrowing is noted. Upper chest: Clear. Other: None. IMPRESSION: No acute abnormality head or cervical spine. Chronic microvascular ischemic change. Atherosclerosis. Cervical degenerative disease. Electronically Signed   By: Inge Rise M.D.   On: 09/28/2017 14:22   Ct Cervical Spine Wo Contrast  Result Date: 09/28/2017 CLINICAL DATA:  Onset of right arm and hand numbness yesterday morning which resolved. The patient also had a 20 minutes episode of slurred speech yesterday morning. EXAM: CT HEAD WITHOUT CONTRAST CT CERVICAL  SPINE WITHOUT CONTRAST TECHNIQUE: Multidetector CT imaging of the head and cervical spine was performed following the standard protocol without intravenous contrast. Multiplanar CT image reconstructions of the cervical spine were also generated. COMPARISON:  None. FINDINGS: CT HEAD FINDINGS Brain: No evidence of acute infarction, hemorrhage, hydrocephalus, extra-axial collection or mass lesion/mass effect. Chronic microvascular ischemic change is noted. Vascular: Atherosclerotic vascular disease is identified. Skull: Intact. Sinuses/Orbits: No acute finding. Status post bilateral lens extraction. Other: None. CT CERVICAL SPINE FINDINGS Alignment: Maintain with reversal of lordosis noted. Skull base and vertebrae: No acute fracture. No primary bone lesion or focal pathologic process. 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 Soft tissues and spinal canal: No prevertebral fluid or swelling. No visible canal hematoma. Disc levels: Marked loss of disc space height is seen from C2-C7. The patient has a capacious appearing central spinal canal. Scattered foraminal narrowing is noted. Upper chest:  Clear. Other: None. IMPRESSION: No acute abnormality head or cervical spine. Chronic microvascular ischemic change. Atherosclerosis. Cervical degenerative disease. Electronically Signed   By: Inge Rise M.D.   On: 09/28/2017 14:22    EKG: Independently reviewed.  NSR with rate 72; possible LVH with no evidence of acute ischemia   Labs on Admission: I have personally reviewed the available labs and imaging studies at the time of the admission.  Pertinent labs:   Na++ 134 Glucose 109 BUN 17/Creatinine 1.09/GFR 45 Hgb 10.8 Platelets 131 UA WNL UDS negative  Assessment/Plan Principal Problem:   TIA (transient ischemic attack) Active Problems:   Essential hypertension   CKD (chronic kidney disease), stage III (HCC)   TIA -Patient with only h/o HTN who is extremely independent presenting with transient neurologic symptoms, unilateral, concerning for TIA -Will place in observation status for CVA/TIA evaluation -Telemetry monitoring -MRI/MRA -Carotid dopplers -Echo -Risk stratification with FLP, A1c; will also check TSH  -Neurology consult -She has not been taking ASA daily, as it was stopped by her doctor several weeks ago; will use DAPT for 3 weeks and then transition to ASA 81 mg daily -PT/OT/ST/Nutrition Consults -SW consult for assistance regarding interest in Independent Living facilities in the area -Check FLP; start empiric Lipitor 80 mg daily now  HTN -Allow permissive HTN -Treat BP only if >220/120, and then with goal of 15% reduction -Hold ACE and plan to restart in 65-78 hours  CKD -Uncertain baseline creatinine, but with long-standing HTN and use of ACE as monotherapy for BP control, suspect that this is chronic rather than acute -Will follow   DVT prophylaxis:  Lovenox  Code Status:  Full - confirmed with patient/family Family Communication: Daughter present throughout evaluation Disposition Plan:  Home once clinically improved Consults called:  Neurology; PT/OT/ST/Nutrition/SW  Admission status: It is my clinical opinion that referral for OBSERVATION is reasonable and necessary in this patient based on the above information provided. The aforementioned taken together are felt to place the patient at high risk for further clinical deterioration. However it is anticipated that the patient may be medically stable for discharge from the hospital within 24 to 48 hours.    Karmen Bongo MD [facility]  If note is complete, please contact covering daytime or nighttime physician. [email or web address] Password [email or web address]  09/28/2017, 6:50 PM

## 2017-09-28 NOTE — ED Notes (Signed)
Regular diet food tray ordered.

## 2017-09-28 NOTE — ED Notes (Signed)
Patient provided snack. Ok to eat per MD

## 2017-09-29 ENCOUNTER — Observation Stay (HOSPITAL_BASED_OUTPATIENT_CLINIC_OR_DEPARTMENT_OTHER): Payer: Medicare Other

## 2017-09-29 DIAGNOSIS — I10 Essential (primary) hypertension: Secondary | ICD-10-CM | POA: Diagnosis not present

## 2017-09-29 DIAGNOSIS — I340 Nonrheumatic mitral (valve) insufficiency: Secondary | ICD-10-CM

## 2017-09-29 DIAGNOSIS — G459 Transient cerebral ischemic attack, unspecified: Secondary | ICD-10-CM | POA: Diagnosis not present

## 2017-09-29 DIAGNOSIS — N183 Chronic kidney disease, stage 3 (moderate): Secondary | ICD-10-CM | POA: Diagnosis not present

## 2017-09-29 LAB — LIPID PANEL
CHOL/HDL RATIO: 3.1 ratio
Cholesterol: 154 mg/dL (ref 0–200)
HDL: 49 mg/dL (ref >40)
LDL Cholesterol: 87 mg/dL (ref 0–99)
Triglycerides: 92 mg/dL (ref <150)
VLDL: 18 mg/dL (ref 0–40)

## 2017-09-29 LAB — HEMOGLOBIN A1C
Hgb A1c MFr Bld: 5.2 % (ref 4.8–5.6)
MEAN PLASMA GLUCOSE: 103 mg/dL

## 2017-09-29 LAB — ECHOCARDIOGRAM COMPLETE
Height: 59 in
Weight: 1680 [oz_av]

## 2017-09-29 MED ORDER — CLOPIDOGREL BISULFATE 75 MG PO TABS: 75.0000 mg | ORAL_TABLET | Freq: Every day | ORAL | 0 refills | Status: DC

## 2017-09-29 MED ORDER — ATORVASTATIN CALCIUM 80 MG PO TABS: 80.0000 mg | ORAL_TABLET | Freq: Every day | ORAL | 1 refills | Status: DC

## 2017-09-29 MED ORDER — ASPIRIN EC 81 MG PO TBEC: 81.0000 mg | DELAYED_RELEASE_TABLET | Freq: Every day | ORAL | 1 refills | Status: AC

## 2017-09-29 NOTE — Progress Notes (Signed)
Pt being discharged from hospital per orders from MD. Pt educated on discharge instructions. Pt verbalized understanding of instructions. All questions and concerns were addressed. Pt's IV was removed prior to discharge. Pt exited hospital via wheelchair accompanied by staff. 

## 2017-09-29 NOTE — Progress Notes (Signed)
OT Cancellation Note and Discharge  Patient Details Name: Sarah Hampton MRN: [id number] DOB: Jan 29, 1931   Cancelled Treatment:    Reason Eval/Treat Not Completed: OT screened, no needs identified, will sign off. Symptoms have resolved, Pt and family with no questions or concerns. Educated on BE FAST.   [place] 09/29/2017, 3:27 PM  Hulda Humphrey OTR/L [phone]

## 2017-09-29 NOTE — Discharge Summary (Signed)
Physician Discharge Summary  Sarah Hampton DJS:[id number] DOB: 1930-04-11 DOA: 09/28/2017  PCP: Patient, No Pcp Per  Admit date: 09/28/2017 Discharge date: 09/29/2017  Admitted From: Home Disposition:  Home  Recommendations for Outpatient Follow-up:  1. Follow up with PCP in 3 weeks 2. Aspirin and Plavix for 3 weeks, followed by aspirin alone  Home Health: none  Equipment/Devices: None  Discharge Condition: Stable CODE STATUS: Full Diet recommendation: Heart healthy  HPI: Per Dr. Karmen Bongo Sarah Hampton is a 84 y.o. female with medical history significant of HTN presenting with transient neurologic symptoms.  She has chronic R 4/5 finger numbness.  Today, her daughter was leaving the house and the patient asked her to bring her to the hospital.  She complained of numbness in her right arm and right face and difficulty speaking.  Her speech disturbance may still be a bit present but is mild.  She has had tingling in her fingers over a couple of months.  She did feel a neck/shoulder strain last week after exercising.  She dropped a coffee cup on Saturday after going to the [employer] on Friday.  She did not feel dizzy and was able to walk.  Symptoms lasted less than 5 minutes today.  Hospital Course: TIA - 84 yo female presented to the ED 09/29/17.  Admitted to [facility] to rule out CVA. She underwent an MRI which did not show any acute abnormalities, moderate chronic microvascular changes. Neurology was consulted and followed patient while hospitalized.  She was monitored on telemetry and there were no events of A. fib.  She underwent a 2D echo which showed normal EF with grade 1 diastolic dysfunction.  Carotid duplex did not show any significant stenosis.  Lipid panel showed an LDL of was 87 and she was started on a statin.  Physical therapy evaluated patient did not recommend any PT follow-up.  She has returned to baseline, and will be discharged home in stable condition, was placed on dual  antiplatelet therapy with aspirin and Plavix per neurology recommendations for the next 3 weeks, and afterwards she is to continue aspirin alone. HTN -resume home lisinopril  Discharge Diagnoses:  Principal Problem:   TIA (transient ischemic attack) Active Problems:   Essential hypertension   CKD (chronic kidney disease), stage III ([place])   Discharge Instructions  Allergies as of 09/29/2017   No Known Allergies     Medication List    TAKE these medications   aspirin EC 81 MG tablet Take 1 tablet (81 mg total) by mouth daily.   atorvastatin 80 MG tablet Commonly known as:  LIPITOR Take 1 tablet (80 mg total) by mouth daily at 6 PM.   calcium-vitamin D 500-200 MG-UNIT tablet Commonly known as:  OSCAL WITH D Take 1 tablet by mouth daily with breakfast.   clopidogrel 75 MG tablet Commonly known as:  PLAVIX Take 1 tablet (75 mg total) by mouth daily. Start taking on:  09/30/2017   lisinopril 20 MG tablet Commonly known as:  PRINIVIL,ZESTRIL Take 20 mg by mouth daily.   MULTIVITAL tablet Take 1 tablet by mouth daily.        Consultations:  Neurology   Procedures/Studies:  2D echo  Impressions: - Diastolic dysfunction, normal EF, no evidence of intra-atrial flow by color doppler.  Ct Head Wo Contrast  Result Date: 09/28/2017 CLINICAL DATA:  Onset of right arm and hand numbness yesterday morning which resolved. The patient also had a 20 minutes episode of slurred speech yesterday morning. EXAM:  CT HEAD WITHOUT CONTRAST CT CERVICAL SPINE WITHOUT CONTRAST TECHNIQUE: Multidetector CT imaging of the head and cervical spine was performed following the standard protocol without intravenous contrast. Multiplanar CT image reconstructions of the cervical spine were also generated. COMPARISON:  None. FINDINGS: CT HEAD FINDINGS Brain: No evidence of acute infarction, hemorrhage, hydrocephalus, extra-axial collection or mass lesion/mass effect. Chronic microvascular ischemic change  is noted. Vascular: Atherosclerotic vascular disease is identified. Skull: Intact. Sinuses/Orbits: No acute finding. Status post bilateral lens extraction. Other: None. CT CERVICAL SPINE FINDINGS Alignment: Maintain with reversal of lordosis noted. Skull base and vertebrae: No acute fracture. No primary bone lesion or focal pathologic process. 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 Soft tissues and spinal canal: No prevertebral fluid or swelling. No visible canal hematoma. Disc levels: Marked loss of disc space height is seen from C2-C7. The patient has a capacious appearing central spinal canal. Scattered foraminal narrowing is noted. Upper chest: Clear. Other: None. IMPRESSION: No acute abnormality head or cervical spine. Chronic microvascular ischemic change. Atherosclerosis. Cervical degenerative disease. Electronically Signed   By: Inge Rise M.D.   On: 09/28/2017 14:22   Ct Cervical Spine Wo Contrast  Result Date: 09/28/2017 CLINICAL DATA:  Onset of right arm and hand numbness yesterday morning which resolved. The patient also had a 20 minutes episode of slurred speech yesterday morning. EXAM: CT HEAD WITHOUT CONTRAST CT CERVICAL SPINE WITHOUT CONTRAST TECHNIQUE: Multidetector CT imaging of the head and cervical spine was performed following the standard protocol without intravenous contrast. Multiplanar CT image reconstructions of the cervical spine were also generated. COMPARISON:  None. FINDINGS: CT HEAD FINDINGS Brain: No evidence of acute infarction, hemorrhage, hydrocephalus, extra-axial collection or mass lesion/mass effect. Chronic microvascular ischemic change is noted. Vascular: Atherosclerotic vascular disease is identified. Skull: Intact. Sinuses/Orbits: No acute finding. Status post bilateral lens extraction. Other: None. CT CERVICAL SPINE FINDINGS Alignment: Maintain with reversal of lordosis noted. Skull base and vertebrae: No acute fracture. No  primary bone lesion or focal pathologic process. 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 Soft tissues and spinal canal: No prevertebral fluid or swelling. No visible canal hematoma. Disc levels: Marked loss of disc space height is seen from C2-C7. The patient has a capacious appearing central spinal canal. Scattered foraminal narrowing is noted. Upper chest: Clear. Other: None. IMPRESSION: No acute abnormality head or cervical spine. Chronic microvascular ischemic change. Atherosclerosis. Cervical degenerative disease. Electronically Signed   By: Inge Rise M.D.   On: 09/28/2017 14:22   Mr Brain Wo Contrast  Result Date: 09/28/2017 CLINICAL DATA:  84 y/o F; episode of right arm and face numbness with difficulty speaking. EXAM: MRI HEAD WITHOUT CONTRAST MRA HEAD WITHOUT CONTRAST TECHNIQUE: Multiplanar, multiecho pulse sequences of the brain and surrounding structures were obtained without intravenous contrast. Angiographic images of the head were obtained using MRA technique without contrast. COMPARISON:  09/28/2016 CT head. FINDINGS: MRI HEAD FINDINGS Brain: No acute infarction, hemorrhage, hydrocephalus, extra-axial collection or mass lesion. Numerous nonspecific foci of T2 FLAIR hyperintense signal abnormality in subcortical and periventricular white matter are compatible with moderate chronic microvascular ischemic changes for age. Moderate brain parenchymal volume loss. Vascular: Normal flow voids. Skull and upper cervical spine: Normal marrow signal. Sinuses/Orbits: Negative. 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 MRA HEAD FINDINGS Internal carotid arteries: Patent. Irregularity of the carotid siphons bilaterally compatible with atherosclerotic disease. Bilateral cavernous segments are ectatic to 6 mm. Anterior cerebral  arteries:  Patent. Middle cerebral arteries: Patent. Anterior communicating artery: Patent. Posterior communicating  arteries:  Patent. Posterior cerebral arteries:  Patent. Basilar artery:  Patent. Vertebral arteries:  Patent. No evidence of high-grade stenosis, large vessel occlusion, or aneurysm identified. IMPRESSION: MRI head: 1. No acute intracranial abnormality identified. 2. Moderate chronic microvascular ischemic changes and parenchymal volume loss of the brain. MRA head: 1. Patent anterior and posterior intracranial circulation. No large vessel occlusion, high-grade stenosis, or aneurysm. 2. Atherosclerotic irregularity of carotid siphons with mild ectasia of cavernous segments. Electronically Signed   By: Kristine Garbe M.D.   On: 09/28/2017 21:07   Mr Jodene Nam Head Wo Contrast  Result Date: 09/28/2017 CLINICAL DATA:  84 y/o F; episode of right arm and face numbness with difficulty speaking. EXAM: MRI HEAD WITHOUT CONTRAST MRA HEAD WITHOUT CONTRAST TECHNIQUE: Multiplanar, multiecho pulse sequences of the brain and surrounding structures were obtained without intravenous contrast. Angiographic images of the head were obtained using MRA technique without contrast. COMPARISON:  09/28/2016 CT head. FINDINGS: MRI HEAD FINDINGS Brain: No acute infarction, hemorrhage, hydrocephalus, extra-axial collection or mass lesion. Numerous nonspecific foci of T2 FLAIR hyperintense signal abnormality in subcortical and periventricular white matter are compatible with moderate chronic microvascular ischemic changes for age. Moderate brain parenchymal volume loss. Vascular: Normal flow voids. Skull and upper cervical spine: Normal marrow signal. Sinuses/Orbits: Negative. 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 MRA HEAD FINDINGS Internal carotid arteries: Patent. Irregularity of the carotid siphons bilaterally compatible with atherosclerotic disease. Bilateral cavernous segments are ectatic to 6 mm. Anterior cerebral arteries:  Patent. Middle cerebral arteries: Patent. Anterior communicating artery: Patent.  Posterior communicating arteries:  Patent. Posterior cerebral arteries:  Patent. Basilar artery:  Patent. Vertebral arteries:  Patent. No evidence of high-grade stenosis, large vessel occlusion, or aneurysm identified. IMPRESSION: MRI head: 1. No acute intracranial abnormality identified. 2. Moderate chronic microvascular ischemic changes and parenchymal volume loss of the brain. MRA head: 1. Patent anterior and posterior intracranial circulation. No large vessel occlusion, high-grade stenosis, or aneurysm. 2. Atherosclerotic irregularity of carotid siphons with mild ectasia of cavernous segments. Electronically Signed   By: Kristine Garbe M.D.   On: 09/28/2017 21:07     Subjective: Upon exam, the patient was found lying upright in the hospital bed, alert and oriented to person, place, and time. No evidence of confusion. No facial droop observed. Fluent speech. Spoke in full sentences. Exhibited competent sensory and motor function.   Discharge Exam: Vitals:   09/29/17 0400 09/29/17 0739  BP: (!) 114/54 (!) 128/52  Pulse: 70 79  Resp:  15  Temp: 98.2 F (36.8 C) 98.4 F (36.9 C)  SpO2: 99% 100%    General exam: Appears calm and comfortable  Respiratory system: Clear to auscultation. Respiratory effort normal. Cardiovascular system: S1 & S2 heard, RRR. No JVD, murmurs, rubs, gallops or clicks. No pedal edema. Gastrointestinal system: Abdomen is nondistended, soft and nontender. No organomegaly or masses felt. Normal bowel sounds heard. Central nervous system: Alert and oriented. No focal neurological deficits. EOMI. PERRLA.  Extremities: Symmetric 5 x 5 power. Skin: No rashes, lesions or ulcers Psychiatry: Judgement and insight appear normal. Mood & affect appropriate.   The results of significant diagnostics from this hospitalization (including imaging, microbiology, ancillary and laboratory) are listed below for reference.    Microbiology: No results found for this or any  previous visit (from the past 240 hour(s)).   Labs: BNP (last 3 results) No results for input(s): BNP in  the last 8760 hours. Basic Metabolic Panel: Recent Labs  Lab 09/28/17 1238 09/28/17 1335  NA 133* 134*  K 5.5* 4.7  CL 102 97*  CO2  --  27  GLUCOSE 88 109*  BUN 24* 17  CREATININE 0.90 1.09*  CALCIUM  --  10.2   Liver Function Tests: Recent Labs  Lab 09/28/17 1335  AST 29  ALT 30  ALKPHOS 46  BILITOT 0.8  PROT 7.4  ALBUMIN 4.6   No results for input(s): LIPASE, AMYLASE in the last 168 hours. No results for input(s): AMMONIA in the last 168 hours. CBC: Recent Labs  Lab 09/28/17 1221 09/28/17 1238  WBC 9.4  --   NEUTROABS 6.1  --   HGB 10.8* 10.5*  HCT 34.3* 31.0*  MCV 101.5*  --   PLT 131*  --    Cardiac Enzymes: No results for input(s): CKTOTAL, CKMB, CKMBINDEX, TROPONINI in the last 168 hours. BNP: Invalid input(s): POCBNP CBG: Recent Labs  Lab 09/28/17 1210  GLUCAP 95   D-Dimer No results for input(s): DDIMER in the last 72 hours. Hgb A1c No results for input(s): HGBA1C in the last 72 hours. Lipid Profile Recent Labs    09/29/17 0552  CHOL 154  HDL 49  LDLCALC 87  TRIG 92  CHOLHDL 3.1   Thyroid function studies Recent Labs    09/28/17 1940  TSH 0.533   Anemia work up No results for input(s): VITAMINB12, FOLATE, FERRITIN, TIBC, IRON, RETICCTPCT in the last 72 hours. Urinalysis    Component Value Date/Time   COLORURINE YELLOW 09/28/2017 1221   APPEARANCEUR HAZY (A) 09/28/2017 1221   LABSPEC 1.006 09/28/2017 1221   PHURINE 7.0 09/28/2017 1221   GLUCOSEU NEGATIVE 09/28/2017 1221   HGBUR NEGATIVE 09/28/2017 1221   BILIRUBINUR NEGATIVE 09/28/2017 1221   KETONESUR NEGATIVE 09/28/2017 1221   PROTEINUR NEGATIVE 09/28/2017 1221   NITRITE NEGATIVE 09/28/2017 1221   LEUKOCYTESUR NEGATIVE 09/28/2017 1221   Sepsis Labs Invalid input(s): PROCALCITONIN,  WBC,  LACTICIDVEN   Time coordinating discharge: 35 minutes  SIGNED: Marney Setting, PA-S  Marzetta Board, MD  [facility] 09/29/2017, 2:39 PM Pager [phone]  If 7PM-7AM, please contact night-coverage [email or web address] Password [email or web address]

## 2017-09-29 NOTE — Care Management Note (Signed)
Case Management Note  Patient Details  Name: Sarah Hampton MRN: [id number] Date of Birth: 07/10/1930  Subjective/Objective:   Pt in with TIA. She is from [place]. She is in [place] visiting her daughter. Pt has PCP in [place].                 Action/Plan: No f/u per PT/OT and no DME needs. Pt has supervision at her daughters home and will remain with her daughter over the next few weeks. Daughter to provide transportation home.   Expected Discharge Date:  09/29/17               Expected Discharge Plan:  Home/Self Care  In-House Referral:     Discharge planning Services     Post Acute Care Choice:    Choice offered to:     DME Arranged:    DME Agency:     HH Arranged:    HH Agency:     Status of Service:  Completed, signed off  If discussed at [employer] of Stay Meetings, dates discussed:    Additional Comments:  Pollie Friar, RN 09/29/2017, 2:50 PM

## 2017-09-29 NOTE — Progress Notes (Signed)
*  Preliminary Results* Carotid artery duplex has been completed. Bilateral internal carotid arteries are 1-39%. Vertebral arteries are patent with antegrade flow.  09/29/2017 11:44 AM  Jinny Blossom Dawna Part

## 2017-09-29 NOTE — Progress Notes (Addendum)
STROKE TEAM PROGRESS NOTE  HPI ( Dr Leonel Ramsay ) Sarah Hampton is an 84 y.o. female PMH of HTN and skin cancer (SCC) who presents to [facility] for right face and arm numbness, slurred speech.  Patient states that she has had right arm numbness off and on for several months.Usually the tingling is just in her pointer and middle finger, and there is some numbness of the hand. She is visiting her daughter and since Thursday has been having transient episodes of right arm numbness and  Hand tingling for quite some time. Friday she reports that her right arm was numb/ weak ( she felt as if she lost control of her arm for a minute) and she dropped a coffee cup. These episodes last about  15 minutes, and there is never any leg involvement. Oncology 09/28/17 morning about 11:00am she had an episode where her entire arm was numb and her face was also numb. There was no tingling. This time she also had trouble speaking. Her daughter states that she noticed some slurred speech that has almost completely resolved. Denies any CP, SOB, vision problems, facial droop, dizziness, difficulty walking, HA, or falls. Denies starring spells, HA (except when the weather changes).  Patient stopped taking ASA in the past month at the recommendation of her PCP. Does state that she has been having right neck pain and stiffness since about Wednesday when she thinks she " over did it" at the [facility].  CT head:no acute abnormality. BG: 109, NA: 134, creatinine 1.09, GFR: 45, toxicology screen: negative.   INTERVAL HISTORY Her daughter is at the bedside.  R face weak with difficulty getting words out, though she knew what she wanted to say. 2-3 days prior to arrival she dropped a coffee cup. Denies walking/balance issues. She goes to the [facility] daily for silver sneakers and does well. Has had recurrent hand numbness which is chronic.  Patient visiting from [place]:   09/28/17 2330 09/29/17 0130 09/29/17 0400 09/29/17 0739  BP: (!)  118/52 (!) 115/51 (!) 114/54 (!) 128/52  Pulse: 75 72 70 79  Resp: 18 16  15   Temp:   98.2 F (36.8 C) 98.4 F (36.9 C)  TempSrc:   Oral Oral  SpO2: 100% 100% 99% 100%  Weight:      Height:        CBC:  Recent Labs  Lab 09/28/17 1221 09/28/17 1238  WBC 9.4  --   NEUTROABS 6.1  --   HGB 10.8* 10.5*  HCT 34.3* 31.0*  MCV 101.5*  --   PLT 131*  --     Basic Metabolic Panel:  Recent Labs  Lab 09/28/17 1238 09/28/17 1335  NA 133* 134*  K 5.5* 4.7  CL 102 97*  CO2  --  27  GLUCOSE 88 109*  BUN 24* 17  CREATININE 0.90 1.09*  CALCIUM  --  10.2   Lipid Panel:     Component Value Date/Time   CHOL 154 09/29/2017 0552   TRIG 92 09/29/2017 0552   HDL 49 09/29/2017 0552   CHOLHDL 3.1 09/29/2017 0552   VLDL 18 09/29/2017 0552   LDLCALC 87 09/29/2017 0552   HgbA1c: No results found for: HGBA1C Urine Drug Screen:     Component Value Date/Time   LABOPIA NONE DETECTED 09/28/2017 1221   COCAINSCRNUR NONE DETECTED 09/28/2017 1221   LABBENZ NONE DETECTED 09/28/2017 1221   AMPHETMU NONE DETECTED 09/28/2017 1221   THCU NONE DETECTED 09/28/2017 1221   LABBARB (  A) 09/28/2017 1221    Result not available. Reagent lot number recalled by manufacturer.    Alcohol Level No results found for: ETH  IMAGING Ct Head Wo Contrast  Result Date: 09/28/2017 CLINICAL DATA:  Onset of right arm and hand numbness yesterday morning which resolved. The patient also had a 20 minutes episode of slurred speech yesterday morning. EXAM: CT HEAD WITHOUT CONTRAST CT CERVICAL SPINE WITHOUT CONTRAST TECHNIQUE: Multidetector CT imaging of the head and cervical spine was performed following the standard protocol without intravenous contrast. Multiplanar CT image reconstructions of the cervical spine were also generated. COMPARISON:  None. FINDINGS: CT HEAD FINDINGS Brain: No evidence of acute infarction, hemorrhage, hydrocephalus, extra-axial collection or mass lesion/mass effect. Chronic microvascular  ischemic change is noted. Vascular: Atherosclerotic vascular disease is identified. Skull: Intact. Sinuses/Orbits: No acute finding. Status post bilateral lens extraction. Other: None. CT CERVICAL SPINE FINDINGS Alignment: Maintain with reversal of lordosis noted. Skull base and vertebrae: No acute fracture. No primary bone lesion or focal pathologic process. 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 Soft tissues and spinal canal: No prevertebral fluid or swelling. No visible canal hematoma. Disc levels: Marked loss of disc space height is seen from C2-C7. The patient has a capacious appearing central spinal canal. Scattered foraminal narrowing is noted. Upper chest: Clear. Other: None. IMPRESSION: No acute abnormality head or cervical spine. Chronic microvascular ischemic change. Atherosclerosis. Cervical degenerative disease. Electronically Signed   By: Inge Rise M.D.   On: 09/28/2017 14:22   Ct Cervical Spine Wo Contrast  Result Date: 09/28/2017 CLINICAL DATA:  Onset of right arm and hand numbness yesterday morning which resolved. The patient also had a 20 minutes episode of slurred speech yesterday morning. EXAM: CT HEAD WITHOUT CONTRAST CT CERVICAL SPINE WITHOUT CONTRAST TECHNIQUE: Multidetector CT imaging of the head and cervical spine was performed following the standard protocol without intravenous contrast. Multiplanar CT image reconstructions of the cervical spine were also generated. COMPARISON:  None. FINDINGS: CT HEAD FINDINGS Brain: No evidence of acute infarction, hemorrhage, hydrocephalus, extra-axial collection or mass lesion/mass effect. Chronic microvascular ischemic change is noted. Vascular: Atherosclerotic vascular disease is identified. Skull: Intact. Sinuses/Orbits: No acute finding. Status post bilateral lens extraction. Other: None. CT CERVICAL SPINE FINDINGS Alignment: Maintain with reversal of lordosis noted. Skull base and vertebrae: No acute  fracture. No primary bone lesion or focal pathologic process. 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 Soft tissues and spinal canal: No prevertebral fluid or swelling. No visible canal hematoma. Disc levels: Marked loss of disc space height is seen from C2-C7. The patient has a capacious appearing central spinal canal. Scattered foraminal narrowing is noted. Upper chest: Clear. Other: None. IMPRESSION: No acute abnormality head or cervical spine. Chronic microvascular ischemic change. Atherosclerosis. Cervical degenerative disease. Electronically Signed   By: Inge Rise M.D.   On: 09/28/2017 14:22   Mr Brain Wo Contrast  Result Date: 09/28/2017 CLINICAL DATA:  84 y/o F; episode of right arm and face numbness with difficulty speaking. EXAM: MRI HEAD WITHOUT CONTRAST MRA HEAD WITHOUT CONTRAST TECHNIQUE: Multiplanar, multiecho pulse sequences of the brain and surrounding structures were obtained without intravenous contrast. Angiographic images of the head were obtained using MRA technique without contrast. COMPARISON:  09/28/2016 CT head. FINDINGS: MRI HEAD FINDINGS Brain: No acute infarction, hemorrhage, hydrocephalus, extra-axial collection or mass lesion. Numerous nonspecific foci of T2 FLAIR hyperintense signal abnormality in subcortical and periventricular white matter are compatible  with moderate chronic microvascular ischemic changes for age. Moderate brain parenchymal volume loss. Vascular: Normal flow voids. Skull and upper cervical spine: Normal marrow signal. Sinuses/Orbits: Negative. 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 MRA HEAD FINDINGS Internal carotid arteries: Patent. Irregularity of the carotid siphons bilaterally compatible with atherosclerotic disease. Bilateral cavernous segments are ectatic to 6 mm. Anterior cerebral arteries:  Patent. Middle cerebral arteries: Patent. Anterior communicating artery: Patent. Posterior  communicating arteries:  Patent. Posterior cerebral arteries:  Patent. Basilar artery:  Patent. Vertebral arteries:  Patent. No evidence of high-grade stenosis, large vessel occlusion, or aneurysm identified. IMPRESSION: MRI head: 1. No acute intracranial abnormality identified. 2. Moderate chronic microvascular ischemic changes and parenchymal volume loss of the brain. MRA head: 1. Patent anterior and posterior intracranial circulation. No large vessel occlusion, high-grade stenosis, or aneurysm. 2. Atherosclerotic irregularity of carotid siphons with mild ectasia of cavernous segments. Electronically Signed   By: Kristine Garbe M.D.   On: 09/28/2017 21:07   Mr Jodene Nam Head Wo Contrast  Result Date: 09/28/2017 CLINICAL DATA:  84 y/o F; episode of right arm and face numbness with difficulty speaking. EXAM: MRI HEAD WITHOUT CONTRAST MRA HEAD WITHOUT CONTRAST TECHNIQUE: Multiplanar, multiecho pulse sequences of the brain and surrounding structures were obtained without intravenous contrast. Angiographic images of the head were obtained using MRA technique without contrast. COMPARISON:  09/28/2016 CT head. FINDINGS: MRI HEAD FINDINGS Brain: No acute infarction, hemorrhage, hydrocephalus, extra-axial collection or mass lesion. Numerous nonspecific foci of T2 FLAIR hyperintense signal abnormality in subcortical and periventricular white matter are compatible with moderate chronic microvascular ischemic changes for age. Moderate brain parenchymal volume loss. Vascular: Normal flow voids. Skull and upper cervical spine: Normal marrow signal. Sinuses/Orbits: Negative. 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 MRA HEAD FINDINGS Internal carotid arteries: Patent. Irregularity of the carotid siphons bilaterally compatible with atherosclerotic disease. Bilateral cavernous segments are ectatic to 6 mm. Anterior cerebral arteries:  Patent. Middle cerebral arteries: Patent. Anterior communicating  artery: Patent. Posterior communicating arteries:  Patent. Posterior cerebral arteries:  Patent. Basilar artery:  Patent. Vertebral arteries:  Patent. No evidence of high-grade stenosis, large vessel occlusion, or aneurysm identified. IMPRESSION: MRI head: 1. No acute intracranial abnormality identified. 2. Moderate chronic microvascular ischemic changes and parenchymal volume loss of the brain. MRA head: 1. Patent anterior and posterior intracranial circulation. No large vessel occlusion, high-grade stenosis, or aneurysm. 2. Atherosclerotic irregularity of carotid siphons with mild ectasia of cavernous segments. Electronically Signed   By: Kristine Garbe M.D.   On: 09/28/2017 21:07   Carotid Doppler   There is 1-39% bilateral ICA stenosis. Vertebral artery flow is antegrade.   PHYSICAL EXAM  frail elderly Caucasian lady currently not in distress. . Afebrile. Head is nontraumatic. Neck is supple without bruit.    Cardiac exam no murmur or gallop. Lungs are clear to auscultation. Distal pulses are well felt. Neurological Exam ;  Awake  Alert oriented x 3. Normal speech and language.eye movements full without nystagmus.fundi were not visualized. Vision acuity and fields appear normal. Hearing is normal. Palatal movements are normal. Face symmetric. Tongue midline. Normal strength, tone, reflexes and coordination. Normal sensation. Gait deferred.  ASSESSMENT/PLAN Ms. Sarah Hampton is a 84 y.o. female with history of HTN, skin cancer presenting with R arm and hand numbness, slurred speech, R facial droop, difficulty getting words out.   L brain TIA (cheiro oral paresthesias) likely from small vessel disease  CT head no acute abnormality. Small vessel disease.  Atherosclerosis.   CT CS cervical degenerative dz  MRI  No stroke  MRA  Unremarkable   Carotid Doppler  B ICA 1-39% stenosis, VAs antegrade   2D Echo  pending   LDL 87  HgbA1c pending   Lovenox 30 mg sq daily for VTE  prophylaxis  No antithrombotic prior to admission (stopped taking aspirin 81 daily for primary prevention 1 mo ago upon recommendation by PCP), now on aspirin 325 mg daily and clopidogrel 75 mg daily following plavix load. . Given TIA with ABCD2 score >4,  recommend aspirin 81 mg and plavix 75 mg daily x 3 weeks, then aspirin alone. Orders adjusted.   Therapy recommendations:  No PT  Disposition:  pending   Hypertension  Stable . BP goal normotensive  Hyperlipidemia  Home meds:  No statin  Started on Lipitor 80  LDL 87, goal for post-stroke < 70  Ok to continue statin at discharge. Adjust dose as indicated.  Other Stroke Risk Factors  Advanced age  Former Cigarette smoker  ETOH use, advised to drink no more than 1 drink(s) a day  Other Active Problems  ? Carpal tunned given long-term R hand numbness. Can do OP testing. Neuro will arrange at time of follow-up if indicated.  Hospital day # 0  Burnetta Sabin, MSN, APRN, ANVP-BC, AGPCNP-BC Advanced Practice Stroke Nurse [place] for Schedule & Pager information 09/29/2017 10:44 AM  I have personally examined this patient, reviewed notes, independently viewed imaging studies, participated in medical decision making and plan of care.ROS completed by me personally and pertinent positives fully documented  I have made any additions or clarifications directly to the above note. Agree with note above.  She has presented with recurrent right face and hand paresthesias and speech difficulties likely from TIA from small vessel disease. Recommend dual antiplatelet therapy for 3 weeks followed by aspirin alone. Continue ongoing stroke workup and aggressive risk factor modification. Long discussion with the patient, daughter and Dr. Malena Peer and answered questions. Greater than 50% time during this 35 minute visit was spent on counseling and coordination of care about her TIA and stroke risk and discussion about  prevention and treatment  Antony Contras, MD Medical Director [place] Pager: [phone] 09/29/2017 2:05 PM  To contact Stroke Continuity provider, please refer to [email or web address] After hours, contact General Neurology

## 2017-09-29 NOTE — Discharge Instructions (Signed)
Follow with PCP in 2-3 weeks  Take aspirin and Plavix for 3 weeks then aspirin alone  Please get a complete blood count and chemistry panel checked by your Primary MD at your next visit, and again as instructed by your Primary MD. Please get your medications reviewed and adjusted by your Primary MD.  Please request your Primary MD to go over all Hospital Tests and Procedure/Radiological results at the follow up, please get all Hospital records sent to your Prim MD by signing hospital release before you go home.  If you had Pneumonia of Lung problems at the Hospital: Please get a 2 view Chest X ray done in 6-8 weeks after hospital discharge or sooner if instructed by your Primary MD.  If you have Congestive Heart Failure: Please call your Cardiologist or Primary MD anytime you have any of the following symptoms:  1) 3 pound weight gain in 24 hours or 5 pounds in 1 week  2) shortness of breath, with or without a dry hacking cough  3) swelling in the hands, feet or stomach  4) if you have to sleep on extra pillows at night in order to breathe  Follow cardiac low salt diet and 1.5 lit/day fluid restriction.  If you have diabetes Accuchecks 4 times/day, Once in AM empty stomach and then before each meal. Log in all results and show them to your primary doctor at your next visit. If any glucose reading is under 80 or above 300 call your primary MD immediately.  If you have Seizure/Convulsions/Epilepsy: Please do not drive, operate heavy machinery, participate in activities at heights or participate in high speed sports until you have seen by Primary MD or a Neurologist and advised to do so again.  If you had Gastrointestinal Bleeding: Please ask your Primary MD to check a complete blood count within one week of discharge or at your next visit. Your endoscopic/colonoscopic biopsies that are pending at the time of discharge, will also need to followed by your Primary MD.  Get Medicines  reviewed and adjusted. Please take all your medications with you for your next visit with your Primary MD  Please request your Primary MD to go over all hospital tests and procedure/radiological results at the follow up, please ask your Primary MD to get all Hospital records sent to his/her office.  If you experience worsening of your admission symptoms, develop shortness of breath, life threatening emergency, suicidal or homicidal thoughts you must seek medical attention immediately by calling 911 or calling your MD immediately  if symptoms less severe.  You must read complete instructions/literature along with all the possible adverse reactions/side effects for all the Medicines you take and that have been prescribed to you. Take any new Medicines after you have completely understood and accpet all the possible adverse reactions/side effects.   Do not drive or operate heavy machinery when taking Pain medications.   Do not take more than prescribed Pain, Sleep and Anxiety Medications  Special Instructions: If you have smoked or chewed Tobacco  in the last 2 yrs please stop smoking, stop any regular Alcohol  and or any Recreational drug use.  Wear Seat belts while driving.  Please note You were cared for by a hospitalist during your hospital stay. If you have any questions about your discharge medications or the care you received while you were in the hospital after you are discharged, you can call the unit and asked to speak with the hospitalist on call if the hospitalist  that took care of you is not available. Once you are discharged, your primary care physician will handle any further medical issues. Please note that NO REFILLS for any discharge medications will be authorized once you are discharged, as it is imperative that you return to your primary care physician (or establish a relationship with a primary care physician if you do not have one) for your aftercare needs so that they can  reassess your need for medications and monitor your lab values.  You can reach the hospitalist office at phone [phone] or fax [phone]   If you do not have a primary care physician, you can call [phone] for a physician referral.  Activity: As tolerated with Full fall precautions use walker/cane & assistance as needed  Diet: heart healthy  Disposition Home  \

## 2017-09-29 NOTE — Evaluation (Signed)
Physical Therapy Evaluation Patient Details Name: Sarah Hampton MRN: [id number] DOB: 10-04-1930 Today's Date: 09/29/2017   History of Present Illness  Sarah Hampton is an 84 y.o. female PMH of HTN and skin cancer, who presents to [facility] for right face and arm numbness, slurred speech. (resolved since arrival) MRI/CT negative for acute abnormalities.  Clinical Impression  Patient presents with mobility close to functional baseline.  Does have gait abnormality at baseline and discussed fall prevention with pt and daughter.  She typically stays active walking each day and going to [facility] for senior exercises.  Educated on importance of completing follow up with PCP prior to return to exercise, but that is good for secondary prevention.  Also placed heat pack on R shoulder blade area due to reports of strain when using weights.  Discussed not using weights till pain is gone.  Feel she is stable to return to prior level of care with family in community and intermittent assist.  No further PT needs at this time.    Follow Up Recommendations No PT follow up    Equipment Recommendations  None recommended by PT    Recommendations for Other Services       Precautions / Restrictions Precautions Precautions: Fall      Mobility  Bed Mobility Overal bed mobility: Modified Independent                Transfers Overall transfer level: Modified independent Equipment used: None                Ambulation/Gait Ambulation/Gait assistance: Independent Gait Distance (Feet): 180 Feet Assistive device: None Gait Pattern/deviations: Step-to pattern;Step-through pattern;Decreased stride length;Antalgic     General Gait Details: slight R limp at times, but not painful per pt, a typical gait for her  Stairs Stairs: Yes Stairs assistance: Modified independent (Device/Increase time) Stair Management: One rail Right;Two rails;Alternating pattern;Forwards Number of Stairs: 4    Wheelchair  Mobility    Modified Rankin (Stroke Patients Only) Modified Rankin (Stroke Patients Only) Pre-Morbid Rankin Score: Slight disability Modified Rankin: Slight disability     Balance Overall balance assessment: Needs assistance   Sitting balance-Leahy Scale: Good       Standing balance-Leahy Scale: Good Standing balance comment: able to step over boxes in hallway no rail, balances on one foot for 2 seconds, able to pick up item from floor and turn to look over each shoulder without LOB                             Pertinent Vitals/Pain Pain Assessment: No/denies pain    Home Living Family/patient expects to be discharged to:: Private residence Living Arrangements: Children Available Help at Discharge: Family Type of Home: House Home Access: Stairs to enter     Home Layout: Two level;Bed/bath upstairs Home Equipment: None      Prior Function Level of Independence: Independent         Comments: stays with daughter here or with daughter in [place] or with sister in [place].  Plans to move here to independent living at the [place]        Extremity/Trunk Assessment   Upper Extremity Assessment Upper Extremity Assessment: Defer to OT evaluation    Lower Extremity Assessment Lower Extremity Assessment: Generalized weakness    Cervical / Trunk Assessment Cervical / Trunk Assessment: Kyphotic  Communication   Communication: HOH  Cognition Arousal/Alertness: Awake/alert Behavior During Therapy:  WFL for tasks assessed/performed Overall Cognitive Status: Within Functional Limits for tasks assessed                                        General Comments General comments (skin integrity, edema, etc.): education with daughter present on fall prevention to include footwear, lighting and grabbar in shower    Exercises     Assessment/Plan    PT Assessment Patent does not need any further PT services  PT Problem  List         PT Treatment Interventions      PT Goals (Current goals can be found in the Care Plan section)  Acute Rehab PT Goals PT Goal Formulation: All assessment and education complete, DC therapy    Frequency     Barriers to discharge        Co-evaluation               AM-PAC PT "6 Clicks" Daily Activity  Outcome Measure Difficulty turning over in bed (including adjusting bedclothes, sheets and blankets)?: None Difficulty moving from lying on back to sitting on the side of the bed? : None Difficulty sitting down on and standing up from a chair with arms (e.g., wheelchair, bedside commode, etc,.)?: None Help needed moving to and from a bed to chair (including a wheelchair)?: None Help needed walking in hospital room?: None Help needed climbing 3-5 steps with a railing? : A Little 6 Click Score: 23    End of Session Equipment Utilized During Treatment: Gait belt Activity Tolerance: Patient tolerated treatment well Patient left: with call bell/phone within reach;in chair;with family/visitor present   PT Visit Diagnosis: Other abnormalities of gait and mobility (R26.89)    Time: 1150-1210 PT Time Calculation (min) (ACUTE ONLY): 20 min   Charges:   PT Evaluation $PT Eval Low Complexity: 1 Low     PT G CodesMagda Kiel, [place] [phone] 09/29/2017   Reginia Naas 09/29/2017, 1:31 PM

## 2017-09-29 NOTE — Progress Notes (Signed)
  Echocardiogram 2D Echocardiogram has been performed.  Zai Chmiel G Hubert Raatz 09/29/2017, 10:27 AM

## 2017-09-29 NOTE — Progress Notes (Signed)
PT Cancellation Note  Patient Details Name: Sarah Hampton MRN: [id number] DOB: [birth year]   Cancelled Treatment:    Reason Eval/Treat Not Completed: Patient at procedure or test/unavailable; patient undergoing echo.  Will attempt later today.   Reginia Naas 09/29/2017, 10:11 AM  Magda Kiel, PT [phone] 09/29/2017

## 2017-09-29 NOTE — Progress Notes (Signed)
SLP Cancellation Note  Patient Details Name: Sarah Hampton MRN: [id number] DOB: 03-12-1931   Cancelled treatment:       Reason Eval/Treat Not Completed: SLP screened, no needs identified, will sign off   Juan Quam Laurice 09/29/2017, 2:20 PM

## 2017-11-12 DIAGNOSIS — E7849 Other hyperlipidemia: Secondary | ICD-10-CM | POA: Insufficient documentation

## 2017-11-18 LAB — BASIC METABOLIC PANEL
BUN: 17 (ref 4–21)
CO2: 27 — AB (ref 13–22)
Chloride: 106 (ref 99–108)
Creatinine: 1.1 (ref 0.5–1.1)
Glucose: 112
Potassium: 4.3 (ref 3.4–5.3)
Sodium: 142 (ref 137–147)

## 2017-11-18 LAB — CBC AND DIFFERENTIAL
HCT: 35 — AB (ref 36–46)
Hemoglobin: 10.9 — AB (ref 12.0–16.0)
Platelets: 182 (ref 150–399)
WBC: 11.2

## 2017-11-18 LAB — HM DEXA SCAN

## 2017-11-18 LAB — VITAMIN B12: Vitamin B-12: 591

## 2018-02-22 LAB — BASIC METABOLIC PANEL
BUN: 18 (ref 4–21)
CO2: 26 — AB (ref 13–22)
Chloride: 108 (ref 99–108)
Creatinine: 1 (ref 0.5–1.1)
Glucose: 95
Potassium: 4.6 (ref 3.4–5.3)
Sodium: 143 (ref 137–147)

## 2018-02-22 LAB — CBC AND DIFFERENTIAL
HCT: 33 — AB (ref 36–46)
Hemoglobin: 9.7 — AB (ref 12.0–16.0)
Platelets: 175 (ref 150–399)
WBC: 11.1

## 2018-02-22 LAB — LIPID PANEL
Cholesterol: 126 (ref 0–200)
HDL: 64 (ref 35–70)
LDL Cholesterol: 50
Triglycerides: 59 (ref 40–160)

## 2018-02-22 LAB — CBC: RBC: 3.09 — AB (ref 3.87–5.11)

## 2018-07-22 HISTORY — PX: SKIN SURGERY: SHX2413

## 2018-12-26 ENCOUNTER — Telehealth: Payer: Self-pay

## 2018-12-26 NOTE — Telephone Encounter (Signed)
Per Judson Roch she will mail packet today.

## 2018-12-26 NOTE — Telephone Encounter (Signed)
Patient called to inform us that she has never received new patient packet. Patient states she was told it wold be mailed to her.  I confirmed address on file and it is correct.  Patient aware I will have Judson Roch whom scheduled her new patient appointment confirm if she has mailed package (no documentation in patient appt notes).  Sarah please follow-up with patient on status of new patient packet  Thanks, CB

## 2019-01-06 ENCOUNTER — Ambulatory Visit: Payer: Self-pay | Admitting: Nurse Practitioner

## 2019-01-13 ENCOUNTER — Encounter: Payer: Self-pay | Admitting: Nurse Practitioner

## 2019-01-13 ENCOUNTER — Other Ambulatory Visit: Payer: Self-pay

## 2019-01-13 ENCOUNTER — Ambulatory Visit (INDEPENDENT_AMBULATORY_CARE_PROVIDER_SITE_OTHER): Payer: Medicare HMO | Admitting: Nurse Practitioner

## 2019-01-13 VITALS — BP 116/70 | HR 80 | Temp 97.8°F | Ht 59.0 in | Wt 105.8 lb

## 2019-01-13 DIAGNOSIS — Z8589 Personal history of malignant neoplasm of other organs and systems: Secondary | ICD-10-CM

## 2019-01-13 DIAGNOSIS — M19049 Primary osteoarthritis, unspecified hand: Secondary | ICD-10-CM

## 2019-01-13 DIAGNOSIS — G459 Transient cerebral ischemic attack, unspecified: Secondary | ICD-10-CM | POA: Diagnosis not present

## 2019-01-13 DIAGNOSIS — E785 Hyperlipidemia, unspecified: Secondary | ICD-10-CM | POA: Diagnosis not present

## 2019-01-13 DIAGNOSIS — I10 Essential (primary) hypertension: Secondary | ICD-10-CM | POA: Diagnosis not present

## 2019-01-13 NOTE — Progress Notes (Signed)
Careteam: Patient Care Team: Lauree Chandler, NP as PCP - General (Geriatric Medicine)  Advanced Directive information Does Patient Have a Medical Advance Directive?: Yes, Type of Advance Directive: [place];Living will, Does patient want to make changes to medical advance directive?: No - Patient declined  No Known Allergies  Chief Complaint  Patient presents with  . Establish Care    New patient establish care. Relocated from [place] and [place]   . Medication Management    Removed Plavix from medication list, per patient she was told to take for a short timeframe     HPI: Patient is a 85 y.o. female seen in the office today to establish care.   Last physical was over a year ago.   Hx of CVA- was vacationing here and ended up at [facility] with CVA, placed on Plavix for 3 weeks told to continue asa 81 mg daily, also on Lipitor 80 mg daily   Osteoarthritis- takes ibuprofen occasional if needed at night.   On calcium for supplement    hypertension- lisinopril 20 mg daily   Hyperlipidemia- continues on lipitor 80 mg daily   Had bone density before she left [place].   Review of Systems:  Review of Systems  Constitutional: Negative for chills, fever and weight loss.  HENT: Negative for tinnitus.   Respiratory: Negative for cough, sputum production and shortness of breath.   Cardiovascular: Negative for chest pain, palpitations and leg swelling.  Gastrointestinal: Negative for abdominal pain, constipation, diarrhea and heartburn.  Genitourinary: Negative for dysuria, frequency and urgency.  Musculoskeletal: Negative for back pain, falls, joint pain and myalgias.  Skin: Negative.   Neurological: Negative for dizziness and headaches.  Endo/Heme/Allergies: Bruises/bleeds easily.  Psychiatric/Behavioral: Negative for depression and memory loss. The patient does not have insomnia.     Past Medical History:  Diagnosis Date  . Hypertension   . Mini  stroke [facility])    Per [facility] New Patient Packet    Past Surgical History:  Procedure Laterality Date  . SQUAMOUS CELL CARCINOMA EXCISION    . TONSILLECTOMY  1946   Per [facility] New Patient Packet    Social History:   reports that she quit smoking about 50 years ago. Her smoking use included cigarettes. She has a 20.00 pack-year smoking history. She has never used smokeless tobacco. She reports current alcohol use. She reports that she does not use drugs.  Family History  Problem Relation Age of Onset  . Dementia Mother 11  . Pneumonia Father 58  . Cancer Father   . Stroke Neg Hx     Medications: Patient's Medications  New Prescriptions   No medications on file  Previous Medications   ASPIRIN EC 81 MG TABLET    Take 81 mg by mouth daily.   ATORVASTATIN (LIPITOR) 80 MG TABLET    Take 1 tablet (80 mg total) by mouth daily at 6 PM.   CALCIUM CARBONATE (CALCIUM 600 PO)    Take 1-2 tablets by mouth daily.   IBUPROFEN (ADVIL) 200 MG TABLET    Take 200 mg by mouth as needed.   LISINOPRIL (PRINIVIL,ZESTRIL) 20 MG TABLET    Take 20 mg by mouth daily.  Modified Medications   No medications on file  Discontinued Medications   CLOPIDOGREL (PLAVIX) 75 MG TABLET    Take 1 tablet (75 mg total) by mouth daily.    Physical Exam:  Vitals:   01/13/19 1339  BP: 116/70  Pulse: 80  Temp:  97.8 F (36.6 C)  TempSrc: Temporal  SpO2: 98%  Weight: 105 lb 12.8 oz (48 kg)  Height: 4' 11"  (1.499 m)   Body mass index is 21.37 kg/m. Wt Readings from Last 3 Encounters:  01/13/19 105 lb 12.8 oz (48 kg)  09/28/17 105 lb (47.6 kg)    Physical Exam Constitutional:      General: She is not in acute distress.    Appearance: Normal appearance. She is well-developed. She is not diaphoretic.     Comments: Thin female  HENT:     Head: Normocephalic and atraumatic.  Eyes:     Conjunctiva/sclera: Conjunctivae normal.     Pupils: Pupils are equal, round, and reactive to light.  Neck:     Musculoskeletal:  Normal range of motion and neck supple.  Cardiovascular:     Rate and Rhythm: Normal rate and regular rhythm.     Heart sounds: Normal heart sounds.  Pulmonary:     Effort: Pulmonary effort is normal.     Breath sounds: Normal breath sounds.  Abdominal:     General: Bowel sounds are normal.     Palpations: Abdomen is soft.  Musculoskeletal:        General: No tenderness.     Right lower leg: No edema.     Left lower leg: No edema.  Skin:    General: Skin is warm and dry.  Neurological:     Mental Status: She is alert and oriented to person, place, and time.  Psychiatric:        Mood and Affect: Mood normal.     Labs reviewed: Basic Metabolic Panel: No results for input(s): NA, K, CL, CO2, GLUCOSE, BUN, CREATININE, CALCIUM, MG, PHOS, TSH in the last 8760 hours. Liver Function Tests: No results for input(s): AST, ALT, ALKPHOS, BILITOT, PROT, ALBUMIN in the last 8760 hours. No results for input(s): LIPASE, AMYLASE in the last 8760 hours. No results for input(s): AMMONIA in the last 8760 hours. CBC: No results for input(s): WBC, NEUTROABS, HGB, HCT, MCV, PLT in the last 8760 hours. Lipid Panel: No results for input(s): CHOL, HDL, LDLCALC, TRIG, CHOLHDL, LDLDIRECT in the last 8760 hours. TSH: No results for input(s): TSH in the last 8760 hours. A1C: Lab Results  Component Value Date   HGBA1C 5.2 09/28/2017     Assessment/Plan 1. Hx of squamous cell carcinoma -multiple skin lesions found due to living in [place] for several years with lots of sun exposure.  - Ambulatory referral to Dermatology  2. TIA (transient ischemic attack) -without deficit, completed 3 weeks of plavix and now on ASA 81 mg daily  - CMP with eGFR([facility]); Future - CBC with Differential/Platelet; Future  3. Essential hypertension -controlled on lisinopril. Will continue current regimen - CMP with eGFR([facility]); Future - CBC with Differential/Platelet; Future  4. Hyperlipidemia LDL goal <70  -continues on statin, will follow up labs prior to next visit - CMP with eGFR([facility]); Future - Lipid panel; Future  5. Osteoarthritis of hand, unspecified laterality, unspecified osteoarthritis type -advised against NSAID due to adverse side effects -to use tylenol 1000 mg by mouth every 8 hours as needed   Next appt: 6 weeks for EV and AWV, labs prior  Awaiting records Janelys Glassner K. [place], [facility] [phone]

## 2019-01-13 NOTE — Patient Instructions (Signed)
To stop advil  Use tylenol 1000 mg by mouth every 8 hours as needed for pain.   Awaiting on your records- they have been requested   Follow up in 6 weeks for AWV and EV

## 2019-02-09 ENCOUNTER — Telehealth: Payer: Self-pay | Admitting: *Deleted

## 2019-02-09 MED ORDER — LISINOPRIL 20 MG PO TABS: 20.0000 mg | ORAL_TABLET | Freq: Every day | ORAL | 1 refills | Status: DC

## 2019-02-09 NOTE — Telephone Encounter (Signed)
Patient called back and needed Lisinopril. Faxed to pharmacy.

## 2019-02-09 NOTE — Telephone Encounter (Signed)
Patient called and left message on clinical voicemail stating that she needed a refill.   Tried calling patient to see which medication she needed refill on and confirm pharmacy. Awaiting callback.

## 2019-02-22 ENCOUNTER — Other Ambulatory Visit: Payer: Medicare HMO

## 2019-02-22 ENCOUNTER — Other Ambulatory Visit: Payer: Self-pay

## 2019-02-22 DIAGNOSIS — E785 Hyperlipidemia, unspecified: Secondary | ICD-10-CM

## 2019-02-22 DIAGNOSIS — I10 Essential (primary) hypertension: Secondary | ICD-10-CM

## 2019-02-22 DIAGNOSIS — G459 Transient cerebral ischemic attack, unspecified: Secondary | ICD-10-CM

## 2019-02-24 ENCOUNTER — Encounter: Payer: Self-pay | Admitting: Nurse Practitioner

## 2019-02-24 ENCOUNTER — Ambulatory Visit (INDEPENDENT_AMBULATORY_CARE_PROVIDER_SITE_OTHER): Payer: Medicare HMO | Admitting: Nurse Practitioner

## 2019-02-24 ENCOUNTER — Other Ambulatory Visit: Payer: Self-pay

## 2019-02-24 VITALS — BP 140/70 | HR 74 | Temp 97.5°F | Ht 59.0 in | Wt 107.8 lb

## 2019-02-24 VITALS — BP 130/78 | HR 74 | Temp 97.5°F | Ht 59.0 in | Wt 107.0 lb

## 2019-02-24 DIAGNOSIS — G459 Transient cerebral ischemic attack, unspecified: Secondary | ICD-10-CM

## 2019-02-24 DIAGNOSIS — Z0000 Encounter for general adult medical examination without abnormal findings: Secondary | ICD-10-CM

## 2019-02-24 DIAGNOSIS — D539 Nutritional anemia, unspecified: Secondary | ICD-10-CM | POA: Diagnosis not present

## 2019-02-24 DIAGNOSIS — Z8589 Personal history of malignant neoplasm of other organs and systems: Secondary | ICD-10-CM

## 2019-02-24 DIAGNOSIS — E785 Hyperlipidemia, unspecified: Secondary | ICD-10-CM

## 2019-02-24 DIAGNOSIS — I10 Essential (primary) hypertension: Secondary | ICD-10-CM

## 2019-02-24 NOTE — Progress Notes (Signed)
Subjective:   Sarah Hampton is a 85 y.o. female who presents for Medicare Annual (Subsequent) preventive examination.  Review of Systems:   Cardiac Risk Factors include: advanced age (>38men, >69 women);hypertension     Objective:     Vitals: BP 140/70 (BP Location: Left Arm, Patient Position: Sitting, Cuff Size: Normal)   Pulse 74   Temp (!) 97.5 F (36.4 C) (Temporal)   Ht 4\' 11"  (1.499 m)   Wt 107 lb 12.8 oz (48.9 kg)   BMI 21.77 kg/m   Body mass index is 21.77 kg/m.  Advanced Directives 01/13/2019 09/28/2017  Does Patient Have a Medical Advance Directive? Yes Yes  Type of Paramedic of [place];Living will [place]  Does patient want to make changes to medical advance directive? No - Patient declined No - Patient declined  Copy of [place] in Chart? No - copy requested No - copy requested    Tobacco Social History   Tobacco Use  Smoking Status Former Smoker  . Packs/day: 0.50  . Years: 30.00  . Pack years: 15.00  . Types: Cigarettes  . Quit date: 09/28/1968  . Years since quitting: 50.4  Smokeless Tobacco Never Used     Counseling given: Not Answered   Clinical Intake:  Pre-visit preparation completed: Yes  Pain : No/denies pain     BMI - recorded: 21.77 Nutritional Status: BMI of 19-24  Normal Nutritional Risks: None Diabetes: No  How often do you need to have someone help you when you read instructions, pamphlets, or other written materials from your doctor or pharmacy?: 1 - Never What is the last grade level you completed in school?: 12th grade  Interpreter Needed?: No     Past Medical History:  Diagnosis Date  . Hypertension   . Mini stroke [facility])    Per [facility] New Patient Packet    Past Surgical History:  Procedure Laterality Date  . SQUAMOUS CELL CARCINOMA EXCISION    . TONSILLECTOMY  1946   Per [facility] New Patient Packet    Family History  Problem Relation Age of Onset  .  Dementia Mother 68  . Pneumonia Father 80  . Cancer Father   . Stroke Neg Hx    Social History   Socioeconomic History  . Marital status: Widowed    Spouse name: Not on file  . Number of children: Not on file  . Years of education: Not on file  . Highest education level: Not on file  Occupational History  . Occupation: retired  Scientific laboratory technician  . Financial resource strain: Not on file  . Food insecurity    Worry: Not on file    Inability: Not on file  . Transportation needs    Medical: Not on file    Non-medical: Not on file  Tobacco Use  . Smoking status: Former Smoker    Packs/day: 0.50    Years: 30.00    Pack years: 15.00    Types: Cigarettes    Quit date: 09/28/1968    Years since quitting: 50.4  . Smokeless tobacco: Never Used  Substance and Sexual Activity  . Alcohol use: Yes    Comment: rare- 1 glass  . Drug use: Never  . Sexual activity: Not on file  Lifestyle  . Physical activity    Days per week: Not on file    Minutes per session: Not on file  . Stress: Not on file  Relationships  . Social connections  Talks on phone: Not on file    Gets together: Not on file    Attends religious service: Not on file    Active member of club or organization: Not on file    Attends meetings of clubs or organizations: Not on file    Relationship status: Not on file  Other Topics Concern  . Not on file  Social History Narrative   Per [facility] New Patient Packet 01/13/2019       Diet: No answer       Caffeine: Coffee       Married, if yes what year: Widowed, married in [place] you live in a house, apartment, assisted living, condo, trailer, ect: One stories, one person (apartment)       Pets: No      Current/Past profession: Museum/gallery conservator       Exercise:Yes, 5-6 x weekly          Living Will: Yes   DNR: Yes   POA/HPOA: Yes      Functional Status:   Do you have difficulty bathing or dressing yourself?No   Do you have difficulty preparing food or  eating? No   Do you have difficulty managing your medications? No   Do you have difficulty managing your finances? No   Do you have difficulty affording your medications? No    Outpatient Encounter Medications as of 02/24/2019  Medication Sig  . aspirin EC 81 MG tablet Take 81 mg by mouth daily.  Marland Kitchen atorvastatin (LIPITOR) 80 MG tablet Take 1 tablet (80 mg total) by mouth daily at 6 PM.  . Calcium Carbonate (CALCIUM 600 PO) Take 1-2 tablets by mouth daily.  Marland Kitchen lisinopril (ZESTRIL) 20 MG tablet Take 1 tablet (20 mg total) by mouth daily.  Marland Kitchen UNABLE TO FIND CBD Recovery Cream 300mg  (2oz/65mL) use twice weekly for had pain  . [DISCONTINUED] ibuprofen (ADVIL) 200 MG tablet Take 200 mg by mouth as needed.   No facility-administered encounter medications on file as of 02/24/2019.     Activities of Daily Living In your present state of health, do you have any difficulty performing the following activities: 02/24/2019  Hearing? Y  Vision? N  Difficulty concentrating or making decisions? N  Walking or climbing stairs? N  Dressing or bathing? N  Doing errands, shopping? N  Preparing Food and eating ? N  Using the Toilet? N  In the past six months, have you accidently leaked urine? N  Do you have problems with loss of bowel control? N  Managing your Medications? N  Managing your Finances? N  Housekeeping or managing your Housekeeping? N  Some recent data might be hidden    Patient Care Team: Lauree Chandler, NP as PCP - General (Geriatric Medicine)    Assessment:   This is a routine wellness examination for Sarah Hampton.  Exercise Activities and Dietary recommendations Current Exercise Habits: Home exercise routine, Type of exercise: calisthenics;strength training/weights;stretching;walking, Time (Minutes): 60, Frequency (Times/Week): 2, Weekly Exercise (Minutes/Week): 120, Intensity: Mild  Goals    . Increase physical activity     Would like to increase intensity of physical activity         Fall Risk Fall Risk  02/24/2019 01/13/2019  Falls in the past year? 0 0  Number falls in past yr: 0 0  Injury with Fall? 0 0   Is the patient's home free of loose throw rugs in walkways, pet beds, electrical cords, etc?  yes      Grab bars in the bathroom? yes      Handrails on the stairs?   no stairs      Adequate lighting?   yes  Timed Get Up and Go performed: na  Depression Screen PHQ 2/9 Scores 02/24/2019 01/13/2019  PHQ - 2 Score 0 0     Cognitive Function MMSE - Mini Mental State Exam 02/24/2019  Orientation to time 5  Orientation to Place 5  Registration 3  Attention/ Calculation 5  Recall 2  Language- name 2 objects 2  Language- repeat 1  Language- follow 3 step command 3  Language- read & follow direction 1  Write a sentence 1  Copy design 0  Total score 28        Immunization History  Administered Date(s) Administered  . Hep A / Hep B 02/15/2012, 03/17/2012, 08/16/2012  . Influenza Split 02/12/2009, 01/09/2010  . Influenza, High Dose Seasonal PF 01/28/2012, 02/10/2013, 01/23/2014, 02/07/2015, 12/18/2018  . Influenza, Quadrivalent, Recombinant, Inj, Pf 01/10/2018  . Influenza, Seasonal, Injecte, Preservative Fre 12/23/2010  . Influenza,inj,Quad PF,6+ Mos 01/11/2017  . Pneumococcal Conjugate-13 01/23/2014  . Pneumococcal Polysaccharide-23 01/27/2007  . Typhoid Inactivated 02/15/2012    Qualifies for Shingles Vaccine?yes  Screening Tests Health Maintenance  Topic Date Due  . TETANUS/TDAP  04/01/1949  . DEXA SCAN  03/31/1997  . INFLUENZA VACCINE  Completed  . PNA vac Low Risk Adult  Completed    Cancer Screenings: Lung: Low Dose CT Chest recommended if Age 58-80 years, 30 pack-year currently smoking OR have quit w/in 15years. Patient does not qualify. Breast:  Up to date on Mammogram? No  Aged out Up to date of Bone Density/Dexa? Unknown, will have to request records Colorectal: aged out  Additional Screenings:  Hepatitis C Screening: na      Plan:      I have personally reviewed and noted the following in the patient's chart:   . Medical and social history . Use of alcohol, tobacco or illicit drugs  . Current medications and supplements . Functional ability and status . Nutritional status . Physical activity . Advanced directives . List of other physicians . Hospitalizations, surgeries, and ER visits in previous 12 months . Vitals . Screenings to include cognitive, depression, and falls . Referrals and appointments  In addition, I have reviewed and discussed with patient certain preventive protocols, quality metrics, and best practice recommendations. A written personalized care plan for preventive services as well as general preventive health recommendations were provided to patient.     Lauree Chandler, NP  02/24/2019

## 2019-02-24 NOTE — Patient Instructions (Signed)
Sarah Hampton , Thank you for taking time to come for your Medicare Wellness Visit. I appreciate your ongoing commitment to your health goals. Please review the following plan we discussed and let me know if I can assist you in the future.   Screening recommendations/referrals: Colonoscopy aged out Mammogram aged out Bone Density due  Recommended yearly ophthalmology/optometry visit for glaucoma screening and checkup Recommended yearly dental visit for hygiene and checkup  Vaccinations: Influenza vaccine up to date Pneumococcal vaccine up to date Tdap vaccine up DUE- recommend getting at local pharmacy Shingles vaccine DUE- recommend getting at local pharmacy     Advanced directives: on file  Conditions/risks identified: none  Next appointment: 1 year   Preventive Care 72 Years and Older, Female Preventive care refers to lifestyle choices and visits with your health care provider that can promote health and wellness. What does preventive care include?  A yearly physical exam. This is also called an annual well check.  Dental exams once or twice a year.  Routine eye exams. Ask your health care provider how often you should have your eyes checked.  Personal lifestyle choices, including:  Daily care of your teeth and gums.  Regular physical activity.  Eating a healthy diet.  Avoiding tobacco and drug use.  Limiting alcohol use.  Practicing safe sex.  Taking low-dose aspirin every day.  Taking vitamin and mineral supplements as recommended by your health care provider. What happens during an annual well check? The services and screenings done by your health care provider during your annual well check will depend on your age, overall health, lifestyle risk factors, and family history of disease. Counseling  Your health care provider may ask you questions about your:  Alcohol use.  Tobacco use.  Drug use.  Emotional well-being.  Home and relationship well-being.   Sexual activity.  Eating habits.  History of falls.  Memory and ability to understand (cognition).  Work and work Statistician.  Reproductive health. Screening  You may have the following tests or measurements:  Height, weight, and BMI.  Blood pressure.  Lipid and cholesterol levels. These may be checked every 5 years, or more frequently if you are over 50 years old.  Skin check.  Lung cancer screening. You may have this screening every year starting at age 49 if you have a 30-pack-year history of smoking and currently smoke or have quit within the past 15 years.  Fecal occult blood test (FOBT) of the stool. You may have this test every year starting at age 67.  Flexible sigmoidoscopy or colonoscopy. You may have a sigmoidoscopy every 5 years or a colonoscopy every 10 years starting at age 38.  Hepatitis C blood test.  Hepatitis B blood test.  Sexually transmitted disease (STD) testing.  Diabetes screening. This is done by checking your blood sugar (glucose) after you have not eaten for a while (fasting). You may have this done every 1-3 years.  Bone density scan. This is done to screen for osteoporosis. You may have this done starting at age 15.  Mammogram. This may be done every 1-2 years. Talk to your health care provider about how often you should have regular mammograms. Talk with your health care provider about your test results, treatment options, and if necessary, the need for more tests. Vaccines  Your health care provider may recommend certain vaccines, such as:  Influenza vaccine. This is recommended every year.  Tetanus, diphtheria, and acellular pertussis (Tdap, Td) vaccine. You may need a Td  booster every 10 years.  Zoster vaccine. You may need this after age 36.  Pneumococcal 13-valent conjugate (PCV13) vaccine. One dose is recommended after age 60.  Pneumococcal polysaccharide (PPSV23) vaccine. One dose is recommended after age 41. Talk to your  health care provider about which screenings and vaccines you need and how often you need them. This information is not intended to replace advice given to you by your health care provider. Make sure you discuss any questions you have with your health care provider. Document Released: 04/05/2015 Document Revised: 11/27/2015 Document Reviewed: 01/08/2015 Elsevier Interactive Patient Education  2017 [employer] Prevention in the Home Falls can cause injuries. They can happen to people of all ages. There are many things you can do to make your home safe and to help prevent falls. What can I do on the outside of my home?  Regularly fix the edges of walkways and driveways and fix any cracks.  Remove anything that might make you trip as you walk through a door, such as a raised step or threshold.  Trim any bushes or trees on the path to your home.  Use bright outdoor lighting.  Clear any walking paths of anything that might make someone trip, such as rocks or tools.  Regularly check to see if handrails are loose or broken. Make sure that both sides of any steps have handrails.  Any raised decks and porches should have guardrails on the edges.  Have any leaves, snow, or ice cleared regularly.  Use sand or salt on walking paths during winter.  Clean up any spills in your garage right away. This includes oil or grease spills. What can I do in the bathroom?  Use night lights.  Install grab bars by the toilet and in the tub and shower. Do not use towel bars as grab bars.  Use non-skid mats or decals in the tub or shower.  If you need to sit down in the shower, use a plastic, non-slip stool.  Keep the floor dry. Clean up any water that spills on the floor as soon as it happens.  Remove soap buildup in the tub or shower regularly.  Attach bath mats securely with double-sided non-slip rug tape.  Do not have throw rugs and other things on the floor that can make you trip. What  can I do in the bedroom?  Use night lights.  Make sure that you have a light by your bed that is easy to reach.  Do not use any sheets or blankets that are too big for your bed. They should not hang down onto the floor.  Have a firm chair that has side arms. You can use this for support while you get dressed.  Do not have throw rugs and other things on the floor that can make you trip. What can I do in the kitchen?  Clean up any spills right away.  Avoid walking on wet floors.  Keep items that you use a lot in easy-to-reach places.  If you need to reach something above you, use a strong step stool that has a grab bar.  Keep electrical cords out of the way.  Do not use floor polish or wax that makes floors slippery. If you must use wax, use non-skid floor wax.  Do not have throw rugs and other things on the floor that can make you trip. What can I do with my stairs?  Do not leave any items on the stairs.  Make sure that there are handrails on both sides of the stairs and use them. Fix handrails that are broken or loose. Make sure that handrails are as long as the stairways.  Check any carpeting to make sure that it is firmly attached to the stairs. Fix any carpet that is loose or worn.  Avoid having throw rugs at the top or bottom of the stairs. If you do have throw rugs, attach them to the floor with carpet tape.  Make sure that you have a light switch at the top of the stairs and the bottom of the stairs. If you do not have them, ask someone to add them for you. What else can I do to help prevent falls?  Wear shoes that:  Do not have high heels.  Have rubber bottoms.  Are comfortable and fit you well.  Are closed at the toe. Do not wear sandals.  If you use a stepladder:  Make sure that it is fully opened. Do not climb a closed stepladder.  Make sure that both sides of the stepladder are locked into place.  Ask someone to hold it for you, if possible.   Clearly mark and make sure that you can see:  Any grab bars or handrails.  First and last steps.  Where the edge of each step is.  Use tools that help you move around (mobility aids) if they are needed. These include:  Canes.  Walkers.  Scooters.  Crutches.  Turn on the lights when you go into a dark area. Replace any light bulbs as soon as they burn out.  Set up your furniture so you have a clear path. Avoid moving your furniture around.  If any of your floors are uneven, fix them.  If there are any pets around you, be aware of where they are.  Review your medicines with your doctor. Some medicines can make you feel dizzy. This can increase your chance of falling. Ask your doctor what other things that you can do to help prevent falls. This information is not intended to replace advice given to you by your health care provider. Make sure you discuss any questions you have with your health care provider. Document Released: 01/03/2009 Document Revised: 08/15/2015 Document Reviewed: 04/13/2014 Elsevier Interactive Patient Education  2017 [employer].

## 2019-02-24 NOTE — Patient Instructions (Addendum)
Make sure to be getting adequate protein in your diet. Your levels are low  We are going to add on some labs because you are anemic  Follow up lab in 2 months to follow this  Follow up office visit in 6 months for routine follow up.  Recommend to get COVID vaccine once available   Tylenol  (Acetaminophen) is safe to use for joint pains--  Limit to LESS than 3000 mg in 24 hours     Health Maintenance, Female Adopting a healthy lifestyle and getting preventive care are important in promoting health and wellness. Ask your health care provider about:  The right schedule for you to have regular tests and exams.  Things you can do on your own to prevent diseases and keep yourself healthy. What should I know about diet, weight, and exercise? Eat a healthy diet   Eat a diet that includes plenty of vegetables, fruits, low-fat dairy products, and lean protein.  Do not eat a lot of foods that are high in solid fats, added sugars, or sodium. Maintain a healthy weight Body mass index (BMI) is used to identify weight problems. It estimates body fat based on height and weight. Your health care provider can help determine your BMI and help you achieve or maintain a healthy weight. Get regular exercise Get regular exercise. This is one of the most important things you can do for your health. Most adults should:  Exercise for at least 150 minutes each week. The exercise should increase your heart rate and make you sweat (moderate-intensity exercise).  Do strengthening exercises at least twice a week. This is in addition to the moderate-intensity exercise.  Spend less time sitting. Even light physical activity can be beneficial. Watch cholesterol and blood lipids Have your blood tested for lipids and cholesterol at 83 years of age, then have this test every 5 years. Have your cholesterol levels checked more often if:  Your lipid or cholesterol levels are high.  You are older than 83 years of  age.  You are at high risk for heart disease. What should I know about cancer screening? Depending on your health history and family history, you may need to have cancer screening at various ages. This may include screening for:  Breast cancer.  Cervical cancer.  Colorectal cancer.  Skin cancer.  Lung cancer. What should I know about heart disease, diabetes, and high blood pressure? Blood pressure and heart disease  High blood pressure causes heart disease and increases the risk of stroke. This is more likely to develop in people who have high blood pressure readings, are of African descent, or are overweight.  Have your blood pressure checked: ? Every 3-5 years if you are 38-52 years of age. ? Every year if you are 58 years old or older. Diabetes Have regular diabetes screenings. This checks your fasting blood sugar level. Have the screening done:  Once every three years after age 84 if you are at a normal weight and have a low risk for diabetes.  More often and at a younger age if you are overweight or have a high risk for diabetes. What should I know about preventing infection? Hepatitis B If you have a higher risk for hepatitis B, you should be screened for this virus. Talk with your health care provider to find out if you are at risk for hepatitis B infection. Hepatitis C Testing is recommended for:  Everyone born from [birth year] through 1963.  Anyone with known risk factors  for hepatitis C. Sexually transmitted infections (STIs)  Get screened for STIs, including gonorrhea and chlamydia, if: ? You are sexually active and are younger than 83 years of age. ? You are older than 83 years of age and your health care provider tells you that you are at risk for this type of infection. ? Your sexual activity has changed since you were last screened, and you are at increased risk for chlamydia or gonorrhea. Ask your health care provider if you are at risk.  Ask your health care  provider about whether you are at high risk for HIV. Your health care provider may recommend a prescription medicine to help prevent HIV infection. If you choose to take medicine to prevent HIV, you should first get tested for HIV. You should then be tested every 3 months for as long as you are taking the medicine. Pregnancy  If you are about to stop having your period (premenopausal) and you may become pregnant, seek counseling before you get pregnant.  Take 400 to 800 micrograms (mcg) of folic acid every day if you become pregnant.  Ask for birth control (contraception) if you want to prevent pregnancy. Osteoporosis and menopause Osteoporosis is a disease in which the bones lose minerals and strength with aging. This can result in bone fractures. If you are 29 years old or older, or if you are at risk for osteoporosis and fractures, ask your health care provider if you should:  Be screened for bone loss.  Take a calcium or vitamin D supplement to lower your risk of fractures.  Be given hormone replacement therapy (HRT) to treat symptoms of menopause. Follow these instructions at home: Lifestyle  Do not use any products that contain nicotine or tobacco, such as cigarettes, e-cigarettes, and chewing tobacco. If you need help quitting, ask your health care provider.  Do not use street drugs.  Do not share needles.  Ask your health care provider for help if you need support or information about quitting drugs. Alcohol use  Do not drink alcohol if: ? Your health care provider tells you not to drink. ? You are pregnant, may be pregnant, or are planning to become pregnant.  If you drink alcohol: ? Limit how much you use to 0-1 drink a day. ? Limit intake if you are breastfeeding.  Be aware of how much alcohol is in your drink. In the [place] one drink equals one 12 oz bottle of beer (355 mL), one 5 oz glass of wine (148 mL), or one 1 oz glass of hard liquor (44 mL). General  instructions  Schedule regular health, dental, and eye exams.  Stay current with your vaccines.  Tell your health care provider if: ? You often feel depressed. ? You have ever been abused or do not feel safe at home. Summary  Adopting a healthy lifestyle and getting preventive care are important in promoting health and wellness.  Follow your health care provider's instructions about healthy diet, exercising, and getting tested or screened for diseases.  Follow your health care provider's instructions on monitoring your cholesterol and blood pressure. This information is not intended to replace advice given to you by your health care provider. Make sure you discuss any questions you have with your health care provider. Document Released: 09/22/2010 Document Revised: 03/02/2018 Document Reviewed: 03/02/2018 Elsevier Patient Education  2020 [employer].

## 2019-02-24 NOTE — Progress Notes (Signed)
Provider: Lauree Chandler, NP  Patient Care Team: Lauree Chandler, NP as PCP - General (Geriatric Medicine)  Extended Emergency Contact Information Primary Emergency Contact: Melynda Ripple) Mobile Phone: [phone] Relation: Daughter Preferred language: English No Known Allergies Code Status: FULL Goals of Care: Advanced Directive information Advanced Directives 01/13/2019  Does Patient Have a Medical Advance Directive? Yes  Type of Paramedic of [place];Living will  Does patient want to make changes to medical advance directive? No - Patient declined  Copy of [place] in Chart? No - copy requested     Chief Complaint  Patient presents with  . Annual Exam    Yearly physical and discuss labs (copy printed)     HPI: Patient is a 85 y.o. female seen in today for an annual wellness exam.    Diet? Tries to eat healthy  Exercise? Twice weekly.    Dentition: looking for a dentist, trying to decide which one to see.  Ophthalmology appt: routinely sees, recently got new glasses.  Routine specialist: dermatologist  Wbc mildly elevated. No signs of infection. Occasionally will have hay fever. With allergy symptoms.   OA- stopped NSAIDS started a muscle rub.   Hypertension- currently on lisinopril 20 mg daily. Does not cook with salts, rarely adds.   Depression screen [facility] 2/9 02/24/2019 01/13/2019  Decreased Interest 0 0  Down, Depressed, Hopeless 0 0  PHQ - 2 Score 0 0    Fall Risk  02/24/2019 01/13/2019  Falls in the past year? 0 0  Number falls in past yr: 0 0  Injury with Fall? 0 0   MMSE - Mini Mental State Exam 02/24/2019  Orientation to time 5  Orientation to Place 5  Registration 3  Attention/ Calculation 5  Recall 2  Language- name 2 objects 2  Language- repeat 1  Language- follow 3 step command 3  Language- read & follow direction 1  Write a sentence 1  Copy design 0  Total score 28      Health Maintenance  Topic Date Due  . TETANUS/TDAP  04/01/1949  . DEXA SCAN  03/31/1997  . INFLUENZA VACCINE  Completed  . PNA vac Low Risk Adult  Completed    Past Medical History:  Diagnosis Date  . Hypertension   . Mini stroke [facility])    Per [facility] New Patient Packet     Past Surgical History:  Procedure Laterality Date  . SQUAMOUS CELL CARCINOMA EXCISION    . TONSILLECTOMY  1946   Per [facility] New Patient Packet     Social History   Socioeconomic History  . Marital status: Widowed    Spouse name: Not on file  . Number of children: Not on file  . Years of education: Not on file  . Highest education level: Not on file  Occupational History  . Occupation: retired  Scientific laboratory technician  . Financial resource strain: Not on file  . Food insecurity    Worry: Not on file    Inability: Not on file  . Transportation needs    Medical: Not on file    Non-medical: Not on file  Tobacco Use  . Smoking status: Former Smoker    Packs/day: 0.50    Years: 30.00    Pack years: 15.00    Types: Cigarettes    Quit date: 09/28/1968    Years since quitting: 50.4  . Smokeless tobacco: Never Used  Substance and Sexual Activity  . Alcohol  use: Yes    Comment: rare- 1 glass  . Drug use: Never  . Sexual activity: Not on file  Lifestyle  . Physical activity    Days per week: Not on file    Minutes per session: Not on file  . Stress: Not on file  Relationships  . Social Herbalist on phone: Not on file    Gets together: Not on file    Attends religious service: Not on file    Active member of club or organization: Not on file    Attends meetings of clubs or organizations: Not on file    Relationship status: Not on file  Other Topics Concern  . Not on file  Social History Narrative   Per [facility] New Patient Packet 01/13/2019       Diet: No answer       Caffeine: Coffee       Married, if yes what year: Widowed, married in [place] you live in a house, apartment,  assisted living, condo, trailer, ect: One stories, one person (apartment)       Pets: No      Current/Past profession: Museum/gallery conservator       Exercise:Yes, 5-6 x weekly          Living Will: Yes   DNR: Yes   POA/HPOA: Yes      Functional Status:   Do you have difficulty bathing or dressing yourself?No   Do you have difficulty preparing food or eating? No   Do you have difficulty managing your medications? No   Do you have difficulty managing your finances? No   Do you have difficulty affording your medications? No    Family History  Problem Relation Age of Onset  . Dementia Mother 29  . Pneumonia Father 69  . Cancer Father   . Stroke Neg Hx     Review of Systems:  Review of Systems  Constitutional: Negative for chills, fever and weight loss.  HENT: Negative for sinus pain, sore throat and tinnitus.   Respiratory: Negative for cough, sputum production and shortness of breath.   Cardiovascular: Negative for chest pain, palpitations and leg swelling.  Gastrointestinal: Negative for abdominal pain, blood in stool, constipation, diarrhea, heartburn and melena.  Genitourinary: Negative for dysuria, frequency and urgency.  Musculoskeletal: Negative for back pain, falls, joint pain and myalgias.  Skin: Negative.   Neurological: Negative for dizziness, tingling and headaches.  Psychiatric/Behavioral: Negative for depression and memory loss. The patient does not have insomnia.      Allergies as of 02/24/2019   No Known Allergies     Medication List       Accurate as of February 24, 2019 12:00 PM. If you have any questions, ask your nurse or doctor.        STOP taking these medications   ibuprofen 200 MG tablet Commonly known as: ADVIL Stopped by: Lauree Chandler, NP     TAKE these medications   aspirin EC 81 MG tablet Take 81 mg by mouth daily.   atorvastatin 80 MG tablet Commonly known as: LIPITOR Take 1 tablet (80 mg total) by mouth daily at 6 PM.    CALCIUM 600 PO Take 1-2 tablets by mouth daily.   lisinopril 20 MG tablet Commonly known as: ZESTRIL Take 1 tablet (20 mg total) by mouth daily.   UNABLE TO FIND CBD Recovery Cream 300mg  (2oz/93mL) use twice weekly for had  pain         Physical Exam: Vitals:   02/24/19 1124 02/24/19 1159  BP: 140/70 130/78  Pulse: 74   Temp: (!) 97.5 F (36.4 C)   TempSrc: Temporal   Weight: 107 lb (48.5 kg)   Height: 4\' 11"  (1.499 m)    Body mass index is 21.61 kg/m. Wt Readings from Last 3 Encounters:  02/24/19 107 lb (48.5 kg)  02/24/19 107 lb 12.8 oz (48.9 kg)  01/13/19 105 lb 12.8 oz (48 kg)    Physical Exam Constitutional:      General: She is not in acute distress.    Appearance: She is well-developed. She is not diaphoretic.  HENT:     Head: Normocephalic and atraumatic.     Mouth/Throat:     Pharynx: No oropharyngeal exudate.  Eyes:     Conjunctiva/sclera: Conjunctivae normal.     Pupils: Pupils are equal, round, and reactive to light.  Neck:     Musculoskeletal: Normal range of motion and neck supple.  Cardiovascular:     Rate and Rhythm: Normal rate and regular rhythm.     Heart sounds: Normal heart sounds.  Pulmonary:     Effort: Pulmonary effort is normal.     Breath sounds: Normal breath sounds.  Chest:     Breasts: Breasts are symmetrical.        Right: Normal.        Left: Normal.  Abdominal:     General: Bowel sounds are normal.     Palpations: Abdomen is soft.  Musculoskeletal:        General: No tenderness.  Skin:    General: Skin is warm and dry.  Neurological:     Mental Status: She is alert and oriented to person, place, and time.    Labs reviewed: Basic Metabolic Panel: Recent Labs    02/22/19 0920  NA 139  K 4.3  CL 105  CO2 27  GLUCOSE 85  BUN 18  CREATININE 0.91*  CALCIUM 9.1   Liver Function Tests: Recent Labs    02/22/19 0920  AST 19  ALT 23  BILITOT 0.4  PROT 6.0*   No results for input(s): LIPASE, AMYLASE in the  last 8760 hours. No results for input(s): AMMONIA in the last 8760 hours. CBC: Recent Labs    02/22/19 0920  WBC 10.9*  NEUTROABS 7,379  HGB 9.6*  HCT 31.2*  MCV 101.6*  PLT 183   Lipid Panel: Recent Labs    02/22/19 0920  CHOL 108  HDL 49*  LDLCALC 44  TRIG 72  CHOLHDL 2.2   Lab Results  Component Value Date   HGBA1C 5.2 09/28/2017    Procedures: No results found.  Assessment/Plan 1. Essential hypertension -improved on recheck, at goal on lisinopril 20 mg daily with dietary modifications   2. Hyperlipidemia LDL goal <70 At goal with diet and lipitor. Cont current regimen.   3. Macrocytic anemia -will add on b12 and folate to labs, asymptomatic at this time. - CBC with Differential/Platelet; Future  4. TIA (transient ischemic attack) Doing well without another episode. LDL at goal. Continues on asa 81 mg daily with lipitor and dietary modifications.  5. Hx of squamous cell carcinoma Had follow up with dermatologist   6. Wellness examination The patient was counseled regarding the appropriate use of alcohol, regular self-examination of the breasts on a monthly basis, prevention of dental and periodontal disease, diet, regular sustained exercise for at least 30 minutes 5 times  per week, tobacco use,  and recommended schedule for GI hemoccult testing, colonoscopy, cholesterol, thyroid and diabetes screening. Aged out of most routine screening, she completed AWV today, reviewed recommended vaccines. Plans to get record for last dexa scan.       Next appt: 6 months, will recheck lab in 2 month [place]. [place] [phone]

## 2019-02-25 LAB — LIPID PANEL
Cholesterol: 108 mg/dL (ref <200)
HDL: 49 mg/dL — ABNORMAL LOW (ref >=50)
LDL Cholesterol (Calc): 44 mg/dL (calc)
Non-HDL Cholesterol (Calc): 59 mg/dL (calc) (ref <130)
Total CHOL/HDL Ratio: 2.2 (calc) (ref <5.0)
Triglycerides: 72 mg/dL (ref <150)

## 2019-02-25 LAB — COMPLETE METABOLIC PANEL WITH GFR
AG Ratio: 2.2 (calc) (ref 1.0–2.5)
ALT: 23 U/L (ref 6–29)
AST: 19 U/L (ref 10–35)
Albumin: 4.1 g/dL (ref 3.6–5.1)
Alkaline phosphatase (APISO): 51 U/L (ref 37–153)
BUN/Creatinine Ratio: 20 (calc) (ref 6–22)
BUN: 18 mg/dL (ref 7–25)
CO2: 27 mmol/L (ref 20–32)
Calcium: 9.1 mg/dL (ref 8.6–10.4)
Chloride: 105 mmol/L (ref 98–110)
Creat: 0.91 mg/dL — ABNORMAL HIGH (ref 0.60–0.88)
GFR, Est African American: 66 mL/min/{1.73_m2} (ref >=60)
GFR, Est Non African American: 57 mL/min/{1.73_m2} — ABNORMAL LOW (ref >=60)
Globulin: 1.9 g/dL (calc) (ref 1.9–3.7)
Glucose, Bld: 85 mg/dL (ref 65–139)
Potassium: 4.3 mmol/L (ref 3.5–5.3)
Sodium: 139 mmol/L (ref 135–146)
Total Bilirubin: 0.4 mg/dL (ref 0.2–1.2)
Total Protein: 6 g/dL — ABNORMAL LOW (ref 6.1–8.1)

## 2019-02-25 LAB — TEST AUTHORIZATION

## 2019-02-25 LAB — CBC WITH DIFFERENTIAL/PLATELET
Absolute Monocytes: 774 cells/uL (ref 200–950)
Basophils Absolute: 98 cells/uL (ref 0–200)
Basophils Relative: 0.9 %
Eosinophils Absolute: 534 cells/uL — ABNORMAL HIGH (ref 15–500)
Eosinophils Relative: 4.9 %
HCT: 31.2 % — ABNORMAL LOW (ref 35.0–45.0)
Hemoglobin: 9.6 g/dL — ABNORMAL LOW (ref 11.7–15.5)
Lymphs Abs: 2115 cells/uL (ref 850–3900)
MCH: 31.3 pg (ref 27.0–33.0)
MCHC: 30.8 g/dL — ABNORMAL LOW (ref 32.0–36.0)
MCV: 101.6 fL — ABNORMAL HIGH (ref 80.0–100.0)
Monocytes Relative: 7.1 %
Neutro Abs: 7379 cells/uL (ref 1500–7800)
Neutrophils Relative %: 67.7 %
Platelets: 183 10*3/uL (ref 140–400)
RBC: 3.07 10*6/uL — ABNORMAL LOW (ref 3.80–5.10)
RDW: 17.8 % — ABNORMAL HIGH (ref 11.0–15.0)
Total Lymphocyte: 19.4 %
WBC: 10.9 10*3/uL — ABNORMAL HIGH (ref 3.8–10.8)

## 2019-02-25 LAB — B12 AND FOLATE PANEL
Folate: 15.6 ng/mL
Vitamin B-12: 418 pg/mL (ref 200–1100)

## 2019-03-02 ENCOUNTER — Telehealth: Payer: Self-pay | Admitting: *Deleted

## 2019-03-02 ENCOUNTER — Other Ambulatory Visit: Payer: Self-pay | Admitting: *Deleted

## 2019-03-02 LAB — BASIC METABOLIC PANEL
BUN: 25 — AB (ref 4–21)
CO2: 29 — AB (ref 13–22)
Chloride: 104 (ref 99–108)
Creatinine: 1.2 — AB (ref 0.5–1.1)
Glucose: 117
Potassium: 4.2 (ref 3.4–5.3)
Sodium: 141 (ref 137–147)

## 2019-03-02 LAB — HEPATIC FUNCTION PANEL
ALT: 64 — AB (ref 7–35)
AST: 31 (ref 13–35)
Alkaline Phosphatase: 130 — AB (ref 25–125)
Bilirubin, Total: 0.3

## 2019-03-02 LAB — CBC AND DIFFERENTIAL
HCT: 36 (ref 36–46)
Hemoglobin: 10.5 — AB (ref 12.0–16.0)
Platelets: 187 (ref 150–399)
WBC: 13.7

## 2019-03-02 MED ORDER — ATORVASTATIN CALCIUM 80 MG PO TABS: 80.0000 mg | ORAL_TABLET | Freq: Every day | ORAL | 1 refills | Status: DC

## 2019-03-02 NOTE — Telephone Encounter (Signed)
Patient requested refill

## 2019-03-02 NOTE — Telephone Encounter (Signed)
Patient called and left message on clinical intake stating that she needed a refill.   Tried calling patient and LMOM to return call regarding which medication she needed. Awaiting call back.

## 2019-03-02 NOTE — Telephone Encounter (Signed)
Refill Faxed to pharmacy.

## 2019-03-15 ENCOUNTER — Other Ambulatory Visit: Payer: Self-pay

## 2019-03-15 ENCOUNTER — Other Ambulatory Visit: Payer: Medicare HMO

## 2019-03-15 DIAGNOSIS — D539 Nutritional anemia, unspecified: Secondary | ICD-10-CM

## 2019-03-15 LAB — CBC WITH DIFFERENTIAL/PLATELET
Absolute Monocytes: 944 cells/uL (ref 200–950)
Basophils Absolute: 85 cells/uL (ref 0–200)
Basophils Relative: 0.7 %
Eosinophils Absolute: 569 cells/uL — ABNORMAL HIGH (ref 15–500)
Eosinophils Relative: 4.7 %
HCT: 32.2 % — ABNORMAL LOW (ref 35.0–45.0)
Hemoglobin: 9.8 g/dL — ABNORMAL LOW (ref 11.7–15.5)
Lymphs Abs: 2420 cells/uL (ref 850–3900)
MCH: 30.9 pg (ref 27.0–33.0)
MCHC: 30.4 g/dL — ABNORMAL LOW (ref 32.0–36.0)
MCV: 101.6 fL — ABNORMAL HIGH (ref 80.0–100.0)
Monocytes Relative: 7.8 %
Neutro Abs: 8083 cells/uL — ABNORMAL HIGH (ref 1500–7800)
Neutrophils Relative %: 66.8 %
Platelets: 186 10*3/uL (ref 140–400)
RBC: 3.17 10*6/uL — ABNORMAL LOW (ref 3.80–5.10)
RDW: 17.8 % — ABNORMAL HIGH (ref 11.0–15.0)
Total Lymphocyte: 20 %
WBC: 12.1 10*3/uL — ABNORMAL HIGH (ref 3.8–10.8)

## 2019-05-11 ENCOUNTER — Other Ambulatory Visit: Payer: Self-pay | Admitting: *Deleted

## 2019-05-11 MED ORDER — LISINOPRIL 20 MG PO TABS: 20.0000 mg | ORAL_TABLET | Freq: Every day | ORAL | 1 refills | Status: DC

## 2019-05-11 NOTE — Telephone Encounter (Signed)
[facility] 

## 2019-06-10 LAB — CBC AND DIFFERENTIAL
HCT: 33 — AB (ref 36–46)
Hemoglobin: 9.7 — AB (ref 12.0–16.0)
Platelets: 152 (ref 150–399)
WBC: 10.8

## 2019-06-10 LAB — BASIC METABOLIC PANEL
BUN: 20 (ref 4–21)
Chloride: 99 (ref 99–108)
Creatinine: 1 (ref 0.5–1.1)
Glucose: 100
Potassium: 4.5 (ref 3.4–5.3)
Sodium: 132 — AB (ref 137–147)

## 2019-06-10 LAB — TSH: TSH: 2.26 (ref 0.41–5.90)

## 2019-06-10 LAB — LIPID PANEL
Cholesterol: 110 (ref 0–200)
HDL: 57 (ref 35–70)
LDL Cholesterol: 38
Triglycerides: 74 (ref 40–160)

## 2019-06-10 LAB — HEPATIC FUNCTION PANEL
ALT: 26 (ref 7–35)
AST: 24 (ref 13–35)
Alkaline Phosphatase: 63 (ref 25–125)
Bilirubin, Total: 0.4

## 2019-06-10 LAB — COMPREHENSIVE METABOLIC PANEL: Calcium: 9.1 (ref 8.7–10.7)

## 2019-06-10 LAB — POCT INR: INR: 1.1 (ref 0.9–1.1)

## 2019-06-10 LAB — PROTIME-INR: Protime: 13.9 — AB (ref 10.0–13.8)

## 2019-06-10 LAB — CBC: RBC: 3.18 — AB (ref 3.87–5.11)

## 2019-06-14 ENCOUNTER — Telehealth: Payer: Self-pay

## 2019-06-14 NOTE — Telephone Encounter (Signed)
Okay thank you for the update

## 2019-06-14 NOTE — Telephone Encounter (Signed)
Patient states that she's in [place] and she was admitted to the hospital for having a mini stroke Last Friday 06/09/2019 and discharged on Saturday 06/10/2019. Patient states that hospital changed medication from "Asprin" to "Clopidogrel 75mg " tablet, take 1 tablet daily. Patient states that she won't be back in [place] until April. Patient states that she would like to speak to you with concerns that she has. Patient put on schedule for tomorrow 06/15/2019 at 1pm for Telehealth visit.

## 2019-06-15 ENCOUNTER — Other Ambulatory Visit: Payer: Self-pay

## 2019-06-15 ENCOUNTER — Ambulatory Visit (INDEPENDENT_AMBULATORY_CARE_PROVIDER_SITE_OTHER): Payer: Medicare HMO | Admitting: Nurse Practitioner

## 2019-06-15 ENCOUNTER — Encounter: Payer: Self-pay | Admitting: Nurse Practitioner

## 2019-06-15 DIAGNOSIS — E785 Hyperlipidemia, unspecified: Secondary | ICD-10-CM

## 2019-06-15 DIAGNOSIS — G459 Transient cerebral ischemic attack, unspecified: Secondary | ICD-10-CM | POA: Diagnosis not present

## 2019-06-15 DIAGNOSIS — I10 Essential (primary) hypertension: Secondary | ICD-10-CM | POA: Diagnosis not present

## 2019-06-15 DIAGNOSIS — D539 Nutritional anemia, unspecified: Secondary | ICD-10-CM

## 2019-06-15 NOTE — Progress Notes (Signed)
This service is provided via telemedicine  No vital signs collected/recorded due to the encounter was a telemedicine visit.   Location of patient (ex: home, work): Sister "Margo"  House  in [place].  Patient consents to a telephone visit:  Yes  Location of the provider (ex: office, home):  [facility].  Name of any referring provider:  N/A  Names of all persons participating in the telemedicine service and their role in the encounter:  Patient, Sister Audelia Acton, Heriberto Antigua, RMA, Sherrie Mustache, NP.    Time spent on call:  8 minutes spent on the phone with Medical Assistant.      Careteam: Patient Care Team: Lauree Chandler, NP as PCP - General (Geriatric Medicine)   Advanced Directive information Does Patient Have a Medical Advance Directive?: Yes, Type of Advance Directive: Living will, Does patient want to make changes to medical advance directive?: No - Patient declined  No Known Allergies  Chief Complaint  Patient presents with  . Acute Visit    Concerns      HPI: Patient is a 86 y.o. female for video visit. She has recently been discharged from the hospital Reports last 3/19 thought she was having a mini stroke, right hand face and arm became numb and could not speak- went to the hospital, in [place]- a [facility].  Diagnosed with a TIA, reports they changed ASA to Plavix 75 mg daily. Reports she is spending 2 to 3 months with her sister in [place] and having trouble finding someone that will take her insurance in [place].  LDL 44 on last labs, taking lipitor 80 mg daily  On lisinopril 20 mg daily- last blood pressure check was ~118 sbp.  Review of Systems:  Review of Systems  Constitutional: Negative for chills, fever and weight loss.  HENT: Negative for tinnitus.   Respiratory: Negative for cough, sputum production and shortness of breath.   Cardiovascular: Negative for chest pain, palpitations and leg swelling.  Gastrointestinal:  Negative for abdominal pain, constipation, diarrhea and heartburn.  Genitourinary: Negative for dysuria, frequency and urgency.  Musculoskeletal: Positive for joint pain (OA in right hands). Negative for back pain and myalgias.  Skin: Negative.   Neurological: Negative for dizziness, tingling, tremors, weakness and headaches.  Psychiatric/Behavioral: Negative for depression and memory loss. The patient does not have insomnia.     Past Medical History:  Diagnosis Date  . Arthritis   . History of melanoma    shoulder  . History of squamous cell carcinoma   . History of TIA (transient ischemic attack)   . Hypertension   . Macrocytic anemia   . Mini stroke [facility])    Per [facility] New Patient Packet   . Other hyperlipidemia   . Personal history of malignant neoplasm of other organs and systems    Past Surgical History:  Procedure Laterality Date  . CATARACT EXTRACTION    . DENTAL SURGERY    . SQUAMOUS CELL CARCINOMA EXCISION    . TONSILLECTOMY  1946   Per [facility] New Patient Packet    Social History:   reports that she quit smoking about 50 years ago. Her smoking use included cigarettes. She has a 15.00 pack-year smoking history. She has never used smokeless tobacco. She reports current alcohol use. She reports that she does not use drugs.  Family History  Problem Relation Age of Onset  . Dementia Mother 62  . Breast cancer Mother   . Pneumonia Father 80  .  Cancer Father   . Stroke Neg Hx     Medications: Patient's Medications  New Prescriptions   No medications on file  Previous Medications   ATORVASTATIN (LIPITOR) 80 MG TABLET    Take 1 tablet (80 mg total) by mouth daily at 6 PM.   CALCIUM CARBONATE (CALCIUM 600 PO)    Take 1-2 tablets by mouth daily.   CLOPIDOGREL (PLAVIX) 75 MG TABLET    Take 1 tablet by mouth daily.   LISINOPRIL (ZESTRIL) 20 MG TABLET    Take 1 tablet (20 mg total) by mouth daily.   UNABLE TO FIND    CBD Recovery Cream 300mg  (2oz/78mL) use twice weekly for  had pain  Modified Medications   No medications on file  Discontinued Medications   ASPIRIN EC 81 MG TABLET    Take 81 mg by mouth daily.    Physical Exam:  There were no vitals filed for this visit. There is no height or weight on file to calculate BMI. Wt Readings from Last 3 Encounters:  02/24/19 107 lb (48.5 kg)  02/24/19 107 lb 12.8 oz (48.9 kg)  01/13/19 105 lb 12.8 oz (48 kg)      Labs reviewed: Basic Metabolic Panel: Recent Labs    02/22/19 0920 03/02/19 0000  NA 139 141  K 4.3 4.2  CL 105 104  CO2 27 29*  GLUCOSE 85  --   BUN 18 25*  CREATININE 0.91* 1.2*  CALCIUM 9.1  --    Liver Function Tests: Recent Labs    02/22/19 0920 03/02/19 0000  AST 19 31  ALT 23 64*  ALKPHOS  --  130*  BILITOT 0.4  --   PROT 6.0*  --    No results for input(s): LIPASE, AMYLASE in the last 8760 hours. No results for input(s): AMMONIA in the last 8760 hours. CBC: Recent Labs    02/22/19 0920 03/02/19 0000 03/15/19 0936  WBC 10.9* 13.7 12.1*  NEUTROABS 7,379  --  8,083*  HGB 9.6* 10.5* 9.8*  HCT 31.2* 36 32.2*  MCV 101.6*  --  101.6*  PLT 183 187 186   Lipid Panel: Recent Labs    02/22/19 0920  CHOL 108  HDL 49*  LDLCALC 44  TRIG 72  CHOLHDL 2.2   TSH: No results for input(s): TSH in the last 8760 hours. A1C: Lab Results  Component Value Date   HGBA1C 5.2 09/28/2017     Assessment/Plan 1. TIA (transient ischemic attack) Recent hospitalization in [place], doing well at this time. She was changed from ASA to plavix.  She plans to fax discharge summary to office for review.  2. Essential hypertension -recommended to take blood pressure routinely after medication., goal <140/90. Based on recall it sounds like blood pressure is controlled on lisinipril  3. Hyperlipidemia LDL goal <70 LDL well controlled on lipitor 80 mg with dietary modifications.  4. Macrocytic anemia -will review labs from hospitalization. May need follow up lab work.  Next  appt:  Carlos American. Harle Battiest  [facility] [phone]    Virtual Visit via Video Note  I connected with Onica Gergel on 06/15/19 at  1:00 PM EDT by a video enabled telemedicine application and verified that I am speaking with the correct person using two identifiers.  Location: Patient: in [place] with sister Provider: [facility]   I discussed the limitations of evaluation and management by telemedicine and the availability of in person appointments. The patient expressed  understanding and agreed to proceed.    I discussed the assessment and treatment plan with the patient. The patient was provided an opportunity to ask questions and all were answered. The patient agreed with the plan and demonstrated an understanding of the instructions.   The patient was advised to call back or seek an in-person evaluation if the symptoms worsen or if the condition fails to improve as anticipated.  I provided 25 minutes of non-face-to-face time during this encounter.  Carlos American. Dewaine Oats, AGNP Avs printed and mailed.

## 2019-06-19 ENCOUNTER — Telehealth: Payer: Self-pay

## 2019-06-19 ENCOUNTER — Encounter: Payer: Self-pay | Admitting: Nurse Practitioner

## 2019-06-19 NOTE — Telephone Encounter (Signed)
Incoming labs received from [facility]. Per sticky note from Lauree Chandler, NP no need for lab work at this time, patient to follow-up once she is back in [place].  I left detailed message on voicemail (ok per DPR) with Jessica's comments. I instructed patient to call if she has questions. Labs were abstracted and sent to scanning.

## 2019-07-05 ENCOUNTER — Other Ambulatory Visit: Payer: Self-pay

## 2019-07-05 DIAGNOSIS — G459 Transient cerebral ischemic attack, unspecified: Secondary | ICD-10-CM

## 2019-07-05 MED ORDER — CLOPIDOGREL BISULFATE 75 MG PO TABS: 75.0000 mg | ORAL_TABLET | Freq: Every day | ORAL | 1 refills | Status: DC

## 2019-07-05 NOTE — Telephone Encounter (Signed)
Okay to refill plavix, please verify what pharmacy and okay to send refill.

## 2019-07-05 NOTE — Telephone Encounter (Signed)
Patient states she is on Plavix after her TIA. She is in [place] and says she has enough pills to get her thru 07/09/19. She states she is going to be in [place] probably thru May.  She needs refill on her medication. She states all the information on her hospital visit was faxed to [facility].  You did have a televisit with her on 06/15/19, but it does not appear that the need for a refill was addressed.  Please advise.

## 2019-08-30 ENCOUNTER — Encounter: Payer: Self-pay | Admitting: Nurse Practitioner

## 2019-08-30 ENCOUNTER — Ambulatory Visit (INDEPENDENT_AMBULATORY_CARE_PROVIDER_SITE_OTHER): Payer: Medicare HMO | Admitting: Nurse Practitioner

## 2019-08-30 ENCOUNTER — Other Ambulatory Visit: Payer: Self-pay

## 2019-08-30 VITALS — BP 140/76 | HR 74 | Temp 96.8°F | Ht 59.0 in | Wt 108.6 lb

## 2019-08-30 DIAGNOSIS — D649 Anemia, unspecified: Secondary | ICD-10-CM

## 2019-08-30 DIAGNOSIS — M19049 Primary osteoarthritis, unspecified hand: Secondary | ICD-10-CM | POA: Diagnosis not present

## 2019-08-30 DIAGNOSIS — Z8673 Personal history of transient ischemic attack (TIA), and cerebral infarction without residual deficits: Secondary | ICD-10-CM

## 2019-08-30 DIAGNOSIS — I10 Essential (primary) hypertension: Secondary | ICD-10-CM | POA: Diagnosis not present

## 2019-08-30 DIAGNOSIS — M8580 Other specified disorders of bone density and structure, unspecified site: Secondary | ICD-10-CM

## 2019-08-30 DIAGNOSIS — E785 Hyperlipidemia, unspecified: Secondary | ICD-10-CM

## 2019-08-30 LAB — CBC WITH DIFFERENTIAL/PLATELET
Absolute Monocytes: 816 cells/uL (ref 200–950)
Basophils Absolute: 86 cells/uL (ref 0–200)
Basophils Relative: 0.9 %
Eosinophils Absolute: 528 cells/uL — ABNORMAL HIGH (ref 15–500)
Eosinophils Relative: 5.5 %
HCT: 31.1 % — ABNORMAL LOW (ref 35.0–45.0)
Hemoglobin: 9.4 g/dL — ABNORMAL LOW (ref 11.7–15.5)
Lymphs Abs: 2064 cells/uL (ref 850–3900)
MCH: 30.9 pg (ref 27.0–33.0)
MCHC: 30.2 g/dL — ABNORMAL LOW (ref 32.0–36.0)
MCV: 102.3 fL — ABNORMAL HIGH (ref 80.0–100.0)
Monocytes Relative: 8.5 %
Neutro Abs: 6106 cells/uL (ref 1500–7800)
Neutrophils Relative %: 63.6 %
Platelets: 177 10*3/uL (ref 140–400)
RBC: 3.04 10*6/uL — ABNORMAL LOW (ref 3.80–5.10)
RDW: 19.8 % — ABNORMAL HIGH (ref 11.0–15.0)
Total Lymphocyte: 21.5 %
WBC: 9.6 10*3/uL (ref 3.8–10.8)

## 2019-08-30 LAB — COMPLETE METABOLIC PANEL WITH GFR
AG Ratio: 2.3 (calc) (ref 1.0–2.5)
ALT: 36 U/L — ABNORMAL HIGH (ref 6–29)
AST: 26 U/L (ref 10–35)
Albumin: 4.5 g/dL (ref 3.6–5.1)
Alkaline phosphatase (APISO): 65 U/L (ref 37–153)
BUN/Creatinine Ratio: 21 (calc) (ref 6–22)
BUN: 21 mg/dL (ref 7–25)
CO2: 25 mmol/L (ref 20–32)
Calcium: 9.3 mg/dL (ref 8.6–10.4)
Chloride: 103 mmol/L (ref 98–110)
Creat: 0.98 mg/dL — ABNORMAL HIGH (ref 0.60–0.88)
GFR, Est African American: 60 mL/min/{1.73_m2} (ref >=60)
GFR, Est Non African American: 52 mL/min/{1.73_m2} — ABNORMAL LOW (ref >=60)
Globulin: 2 g/dL (calc) (ref 1.9–3.7)
Glucose, Bld: 88 mg/dL (ref 65–139)
Potassium: 4.8 mmol/L (ref 3.5–5.3)
Sodium: 135 mmol/L (ref 135–146)
Total Bilirubin: 0.5 mg/dL (ref 0.2–1.2)
Total Protein: 6.5 g/dL (ref 6.1–8.1)

## 2019-08-30 MED ORDER — ATORVASTATIN CALCIUM 80 MG PO TABS: 80.0000 mg | ORAL_TABLET | Freq: Every day | ORAL | 1 refills | Status: DC

## 2019-08-30 MED ORDER — CLOPIDOGREL BISULFATE 75 MG PO TABS: 75.0000 mg | ORAL_TABLET | Freq: Every day | ORAL | 1 refills | Status: DC

## 2019-08-30 NOTE — Progress Notes (Signed)
Careteam: Patient Care Team: Lauree Chandler, NP as PCP - General (Geriatric Medicine)  PLACE OF SERVICE:  [place] Directive information Does Patient Have a Medical Advance Directive?: Yes, Type of Advance Directive: Living will, Does patient want to make changes to medical advance directive?: No - Patient declined  No Known Allergies  Chief Complaint  Patient presents with   Medical Management of Chronic Issues    6 month follow-up. Patient just returned from [place], per patient she had TIA and evaluated in the ER for 1.5 days (FYI)    Immunizations    Per patient coivd-19 vaccines up to date    Medication Management    Discuss if patient needs to take calcium or not    Medication Refill    Renew Lipitor and Plavix      HPI: Patient is a 86 y.o. female for routine follow up.   Returned back to [place] from [facility] last month. Doing well since hospitalization for TIA in March. Has another TIA in 2019. Continues on plavix.   Hyperlipidemia- continues on lipitor 80 mg daily. Maintaining low fat diet.    Osteopenia- on cal and vit d with weight bearing exercise.   Hypertension- continues on lisinopril 20 mg daily   Review of Systems:  Review of Systems  Constitutional: Negative for chills, fever and weight loss.  HENT: Positive for hearing loss. Negative for tinnitus.   Respiratory: Negative for cough, sputum production and shortness of breath.   Cardiovascular: Negative for chest pain, palpitations and leg swelling.  Gastrointestinal: Negative for abdominal pain, constipation, diarrhea and heartburn.  Genitourinary: Negative for dysuria, frequency and urgency.  Musculoskeletal: Negative for back pain, falls, joint pain and myalgias.  Skin: Negative.   Neurological: Positive for tingling (occasional after TIA). Negative for dizziness and headaches.  Psychiatric/Behavioral: Negative for depression and memory loss. The patient does not have insomnia.      Past Medical History:  Diagnosis Date   Arthritis    History of melanoma    shoulder   History of squamous cell carcinoma    History of TIA (transient ischemic attack)    Hypertension    Macrocytic anemia    Mini stroke ([place])    Per [place] New Patient Packet    Other hyperlipidemia    Personal history of malignant neoplasm of other organs and systems    Past Surgical History:  Procedure Laterality Date   CATARACT EXTRACTION     DENTAL SURGERY     SQUAMOUS CELL CARCINOMA EXCISION     TONSILLECTOMY  1946   Per [place] New Patient Packet    Social History:   reports that she quit smoking about 50 years ago. Her smoking use included cigarettes. She has a 15.00 pack-year smoking history. She has never used smokeless tobacco. She reports current alcohol use. She reports that she does not use drugs.  Family History  Problem Relation Age of Onset   Dementia Mother 18   Breast cancer Mother    Pneumonia Father 51   Cancer Father    Stroke Neg Hx     Medications: Patient's Medications  New Prescriptions   No medications on file  Previous Medications   ATORVASTATIN (LIPITOR) 80 MG TABLET    Take 1 tablet (80 mg total) by mouth daily at 6 PM.   CALCIUM CARBONATE (CALCIUM 600 PO)    Take 1-2 tablets by mouth daily.   CLOPIDOGREL (PLAVIX) 75 MG TABLET  Take 1 tablet (75 mg total) by mouth daily.   LISINOPRIL (ZESTRIL) 20 MG TABLET    Take 1 tablet (20 mg total) by mouth daily.   UNABLE TO FIND    CBD Recovery Cream 300mg  (2oz/37mL) use twice weekly for had pain  Modified Medications   No medications on file  Discontinued Medications   No medications on file    Physical Exam:  Vitals:   08/30/19 1107  BP: 140/76  Pulse: 74  Temp: (!) 96.8 F (36 C)  TempSrc: Temporal  SpO2: 99%  Weight: 108 lb 9.6 oz (49.3 kg)  Height: 4\' 11"  (1.499 m)   Body mass index is 21.93 kg/m. Wt Readings from Last 3 Encounters:  08/30/19 108 lb 9.6 oz (49.3 kg)  02/24/19  107 lb (48.5 kg)  02/24/19 107 lb 12.8 oz (48.9 kg)    Physical Exam Constitutional:      General: She is not in acute distress.    Appearance: She is well-developed. She is not diaphoretic.  HENT:     Head: Normocephalic and atraumatic.     Mouth/Throat:     Pharynx: No oropharyngeal exudate.  Eyes:     Conjunctiva/sclera: Conjunctivae normal.     Pupils: Pupils are equal, round, and reactive to light.  Cardiovascular:     Rate and Rhythm: Normal rate and regular rhythm.     Heart sounds: Normal heart sounds.  Pulmonary:     Effort: Pulmonary effort is normal.     Breath sounds: Normal breath sounds.  Abdominal:     General: Bowel sounds are normal.     Palpations: Abdomen is soft.  Musculoskeletal:        General: No tenderness.     Cervical back: Normal range of motion and neck supple.     Right lower leg: No edema.     Left lower leg: No edema.  Skin:    General: Skin is warm and dry.  Neurological:     Mental Status: She is alert and oriented to person, place, and time.     Labs reviewed: Basic Metabolic Panel: Recent Labs    02/22/19 0920 03/02/19 0000 06/10/19 0000  NA 139 141 132*  K 4.3 4.2 4.5  CL 105 104 99  CO2 27 29*  --   GLUCOSE 85  --   --   BUN 18 25* 20  CREATININE 0.91* 1.2* 1.0  CALCIUM 9.1  --  9.1  TSH  --   --  2.26   Liver Function Tests: Recent Labs    02/22/19 0920 03/02/19 0000 06/10/19 0000  AST 19 31 24   ALT 23 64* 26  ALKPHOS  --  130* 63  BILITOT 0.4  --   --   PROT 6.0*  --   --    No results for input(s): LIPASE, AMYLASE in the last 8760 hours. No results for input(s): AMMONIA in the last 8760 hours. CBC: Recent Labs    02/22/19 0920 02/22/19 0920 03/02/19 0000 03/15/19 0936 06/10/19 0000  WBC 10.9*  --  13.7 12.1* 10.8  NEUTROABS 7,379  --   --  8,083*  --   HGB 9.6*   < > 10.5* 9.8* 9.7*  HCT 31.2*   < > 36 32.2* 33*  MCV 101.6*  --   --  101.6*  --   PLT 183   < > 187 186 152   < > = values in this  interval not displayed.  Lipid Panel: Recent Labs    02/22/19 0920 06/10/19 0000  CHOL 108 110  HDL 49* 57  LDLCALC 44 38  TRIG 72 74  CHOLHDL 2.2  --    TSH: Recent Labs    06/10/19 0000  TSH 2.26   A1C: Lab Results  Component Value Date   HGBA1C 5.2 09/28/2017     Assessment/Plan 1. History of TIA (transient ischemic attack) -doing well post-hospitalization. Dicussed making sure blood pressure and cholesterol remains well controlled.  - CBC with Differential/Platelet - clopidogrel (PLAVIX) 75 MG tablet; Take 1 tablet (75 mg total) by mouth daily.  Dispense: 90 tablet; Refill: 1  2. Hyperlipidemia LDL goal <70 LDL at goal in March, continue Lipitor with heart healthy diet.  - atorvastatin (LIPITOR) 80 MG tablet; Take 1 tablet (80 mg total) by mouth daily at 6 PM.  Dispense: 90 tablet; Refill: 1  3. Essential hypertension Stable, goal bp <140/90. Continue on lisinopril with low sodium diet - CBC with Differential/Platelet - COMPLETE METABOLIC PANEL WITH GFR  4. Osteoarthritis of hand, unspecified laterality, unspecified osteoarthritis type Improved with cream. Reports this takes away all hand pain.  5. Osteopenia, unspecified location -to continue on calcium 600 mg twice daily  -to add vit d 1000 units daily -weight bearing activities   - COMPLETE METABOLIC PANEL WITH GFR  6. Anemia, unspecified type -will follow up CBC  Next appt: 6 months, routine follow up  [place]. [place], [facility] [phone]

## 2019-11-07 ENCOUNTER — Other Ambulatory Visit: Payer: Self-pay | Admitting: *Deleted

## 2019-11-07 MED ORDER — LISINOPRIL 20 MG PO TABS: 20.0000 mg | ORAL_TABLET | Freq: Every day | ORAL | 1 refills | Status: DC

## 2019-11-07 NOTE — Telephone Encounter (Signed)
Patient requested refill

## 2020-02-02 ENCOUNTER — Other Ambulatory Visit: Payer: Self-pay | Admitting: *Deleted

## 2020-02-02 DIAGNOSIS — Z8673 Personal history of transient ischemic attack (TIA), and cerebral infarction without residual deficits: Secondary | ICD-10-CM

## 2020-02-02 DIAGNOSIS — E785 Hyperlipidemia, unspecified: Secondary | ICD-10-CM

## 2020-02-02 MED ORDER — ATORVASTATIN CALCIUM 80 MG PO TABS: 80.0000 mg | ORAL_TABLET | Freq: Every day | ORAL | 1 refills | Status: DC

## 2020-02-02 MED ORDER — CLOPIDOGREL BISULFATE 75 MG PO TABS: 75.0000 mg | ORAL_TABLET | Freq: Every day | ORAL | 1 refills | Status: DC

## 2020-02-02 NOTE — Telephone Encounter (Signed)
Patient called requesting refills

## 2020-03-04 ENCOUNTER — Ambulatory Visit (INDEPENDENT_AMBULATORY_CARE_PROVIDER_SITE_OTHER): Payer: Medicare HMO | Admitting: Nurse Practitioner

## 2020-03-04 ENCOUNTER — Other Ambulatory Visit: Payer: Self-pay

## 2020-03-04 ENCOUNTER — Encounter: Payer: Self-pay | Admitting: Nurse Practitioner

## 2020-03-04 VITALS — BP 138/80 | HR 90 | Temp 96.9°F | Ht 59.0 in | Wt 104.0 lb

## 2020-03-04 DIAGNOSIS — D649 Anemia, unspecified: Secondary | ICD-10-CM

## 2020-03-04 DIAGNOSIS — E785 Hyperlipidemia, unspecified: Secondary | ICD-10-CM | POA: Diagnosis not present

## 2020-03-04 DIAGNOSIS — E2839 Other primary ovarian failure: Secondary | ICD-10-CM | POA: Diagnosis not present

## 2020-03-04 DIAGNOSIS — M8580 Other specified disorders of bone density and structure, unspecified site: Secondary | ICD-10-CM

## 2020-03-04 DIAGNOSIS — M1711 Unilateral primary osteoarthritis, right knee: Secondary | ICD-10-CM

## 2020-03-04 DIAGNOSIS — H6123 Impacted cerumen, bilateral: Secondary | ICD-10-CM | POA: Diagnosis not present

## 2020-03-04 DIAGNOSIS — I10 Essential (primary) hypertension: Secondary | ICD-10-CM | POA: Diagnosis not present

## 2020-03-04 DIAGNOSIS — Z8673 Personal history of transient ischemic attack (TIA), and cerebral infarction without residual deficits: Secondary | ICD-10-CM

## 2020-03-04 NOTE — Patient Instructions (Addendum)
To schedule AWV- telephone visit Last was 02/24/2019  Call [phone] to schedule bone density   Vit d 2000 units daily and calcium 600 mg twice daily      Osteoarthritis of left knee-  Physical therapy is an option Ice after exercise Stretch after exercise  Can use tylenol 650 mg by mouth every 6 hours as needed

## 2020-03-04 NOTE — Progress Notes (Signed)
Careteam: Patient Care Team: Lauree Chandler, NP as PCP - General (Geriatric Medicine)  PLACE OF SERVICE:  [place] Directive information Does Patient Have a Medical Advance Directive?: Yes, Type of Advance Directive: Living will, Does patient want to make changes to medical advance directive?: No - Patient declined  No Known Allergies  Chief Complaint  Patient presents with   Medical Management of Chronic Issues    6 month follow and discuss need for TD/Tdap    HPI: Patient is a 86 y.o. female for routine follow up  Hx of TIA in March and another in 2019- she has been on plavix since.   Hyperlipidemia- continues on lipitor 80 mg daily, LDL at goal on labs in March  Osteopenia- due for dexa- forgets cal and vit d   Lives at [facility].  Reports she does exercises there. Rides the bike but it causes knee pain so she has not been riding.  Attempt to stay active and exercise.    Review of Systems:  Review of Systems  Constitutional: Negative for chills, fever and weight loss.  HENT: Negative for tinnitus.   Respiratory: Negative for cough, sputum production and shortness of breath.   Cardiovascular: Negative for chest pain, palpitations and leg swelling.  Gastrointestinal: Negative for abdominal pain, constipation, diarrhea and heartburn.  Genitourinary: Negative for dysuria, frequency and urgency.  Musculoskeletal: Positive for joint pain. Negative for back pain, falls and myalgias.  Skin: Negative.   Neurological: Negative for dizziness and headaches.  Psychiatric/Behavioral: Negative for depression and memory loss. The patient does not have insomnia.     Past Medical History:  Diagnosis Date   Arthritis    History of melanoma    shoulder   History of squamous cell carcinoma    History of TIA (transient ischemic attack)    Hypertension    Macrocytic anemia    Mini stroke ([place])    Per [place] New Patient Packet     Other hyperlipidemia    Personal history of malignant neoplasm of other organs and systems    Past Surgical History:  Procedure Laterality Date   CATARACT EXTRACTION     DENTAL SURGERY     SQUAMOUS CELL CARCINOMA EXCISION     TONSILLECTOMY  1946   Per [place] New Patient Packet    Social History:   reports that she quit smoking about 51 years ago. Her smoking use included cigarettes. She has a 15.00 pack-year smoking history. She has never used smokeless tobacco. She reports current alcohol use. She reports that she does not use drugs.  Family History  Problem Relation Age of Onset   Dementia Mother 24   Breast cancer Mother    Pneumonia Father 62   Cancer Father    Stroke Neg Hx     Medications: Patient's Medications  New Prescriptions   No medications on file  Previous Medications   ATORVASTATIN (LIPITOR) 80 MG TABLET    Take 1 tablet (80 mg total) by mouth daily at 6 PM.   CLOPIDOGREL (PLAVIX) 75 MG TABLET    Take 1 tablet (75 mg total) by mouth daily.   LISINOPRIL (ZESTRIL) 20 MG TABLET    Take 1 tablet (20 mg total) by mouth daily.   UNABLE TO FIND    CBD Recovery Cream 300mg  (2oz/51mL) use twice weekly for had pain  Modified Medications   No medications on file  Discontinued Medications   CALCIUM CARBONATE (CALCIUM 600 PO)  Take 1-2 tablets by mouth daily.   CHOLECALCIFEROL (VITAMIN D3) 25 MCG (1000 UNIT) TABLET    Take 1,000 Units by mouth daily.    Physical Exam:  Vitals:   03/04/20 1153  BP: 138/80  Pulse: 90  Temp: (!) 96.9 F (36.1 C)  TempSrc: Temporal  SpO2: 99%  Weight: 104 lb (47.2 kg)  Height: 4\' 11"  (1.499 m)   Body mass index is 21.01 kg/m. Wt Readings from Last 3 Encounters:  03/04/20 104 lb (47.2 kg)  08/30/19 108 lb 9.6 oz (49.3 kg)  02/24/19 107 lb (48.5 kg)    Physical Exam Constitutional:      General: She is not in acute distress.    Appearance: She is well-developed, underweight and well-nourished. She is not  diaphoretic.  HENT:     Head: Normocephalic and atraumatic.     Mouth/Throat:     Mouth: Oropharynx is clear and moist.     Pharynx: No oropharyngeal exudate.  Eyes:     Conjunctiva/sclera: Conjunctivae normal.     Pupils: Pupils are equal, round, and reactive to light.  Cardiovascular:     Rate and Rhythm: Normal rate and regular rhythm.     Heart sounds: Normal heart sounds.  Pulmonary:     Effort: Pulmonary effort is normal.     Breath sounds: Normal breath sounds.  Abdominal:     General: Bowel sounds are normal.     Palpations: Abdomen is soft.  Musculoskeletal:        General: No tenderness or edema.     Cervical back: Normal range of motion and neck supple.  Skin:    General: Skin is warm and dry.  Neurological:     Mental Status: She is alert and oriented to person, place, and time.  Psychiatric:        Mood and Affect: Mood and affect normal.     Labs reviewed: Basic Metabolic Panel: Recent Labs    06/10/19 0000 08/30/19 1135  NA 132* 135  K 4.5 4.8  CL 99 103  CO2  --  25  GLUCOSE  --  88  BUN 20 21  CREATININE 1.0 0.98*  CALCIUM 9.1 9.3  TSH 2.26  --    Liver Function Tests: Recent Labs    06/10/19 0000 08/30/19 1135  AST 24 26  ALT 26 36*  ALKPHOS 63  --   BILITOT  --  0.5  PROT  --  6.5   No results for input(s): LIPASE, AMYLASE in the last 8760 hours. No results for input(s): AMMONIA in the last 8760 hours. CBC: Recent Labs    03/15/19 0936 06/10/19 0000 08/30/19 1135  WBC 12.1* 10.8 9.6  NEUTROABS 8,083*  --  6,106  HGB 9.8* 9.7* 9.4*  HCT 32.2* 33* 31.1*  MCV 101.6*  --  102.3*  PLT 186 152 177   Lipid Panel: Recent Labs    06/10/19 0000  CHOL 110  HDL 57  LDLCALC 38  TRIG 74   TSH: Recent Labs    06/10/19 0000  TSH 2.26   A1C: Lab Results  Component Value Date   HGBA1C 5.2 09/28/2017     Assessment/Plan 1. Estrogen deficiency - DG Bone Density; Future  2. Hyperlipidemia LDL goal <70 -continues on  lipitor, LDL at goal  - COMPLETE METABOLIC PANEL WITH GFR  3. Essential hypertension Controlled on lisinopril with dietary modifications  4. Anemia, unspecified type No signs of bleeding, will follow up labs.  -  Iron, TIBC and Ferritin Panel - CBC with Differential/Platelet  5. Primary osteoarthritis of right knee Worsening knee pain. Continue exercise. Will get PT for evaluation and treatment. To ice knee after exercise and stretch properly.  - Ambulatory referral to [place]  6. Osteopenia, unspecified location -recommend to continue weight bearing activity, calcium 600 mg by mouth twice daily with vit d 200 units daily   7. History of TIA (transient ischemic attack) Stable without repeat occurrence,  continue on lipitor, plavix and lisinopril   8. Bilateral impacted cerumen Ear lavage completed by CMA and pt tolerated well with good results.  Next appt: 4 months.  Carlos [place]. [place], [facility] [phone]

## 2020-03-05 LAB — CBC WITH DIFFERENTIAL/PLATELET
Absolute Monocytes: 818 cells/uL (ref 200–950)
Basophils Absolute: 98 cells/uL (ref 0–200)
Basophils Relative: 0.9 %
Eosinophils Absolute: 534 cells/uL — ABNORMAL HIGH (ref 15–500)
Eosinophils Relative: 4.9 %
HCT: 28.7 % — ABNORMAL LOW (ref 35.0–45.0)
Hemoglobin: 8.7 g/dL — ABNORMAL LOW (ref 11.7–15.5)
Lymphs Abs: 1929 cells/uL (ref 850–3900)
MCH: 31.5 pg (ref 27.0–33.0)
MCHC: 30.3 g/dL — ABNORMAL LOW (ref 32.0–36.0)
MCV: 104 fL — ABNORMAL HIGH (ref 80.0–100.0)
Monocytes Relative: 7.5 %
Neutro Abs: 7521 cells/uL (ref 1500–7800)
Neutrophils Relative %: 69 %
Platelets: 184 10*3/uL (ref 140–400)
RBC: 2.76 10*6/uL — ABNORMAL LOW (ref 3.80–5.10)
RDW: 19.7 % — ABNORMAL HIGH (ref 11.0–15.0)
Total Lymphocyte: 17.7 %
WBC: 10.9 10*3/uL — ABNORMAL HIGH (ref 3.8–10.8)

## 2020-03-05 LAB — COMPLETE METABOLIC PANEL WITH GFR
AG Ratio: 1.8 (calc) (ref 1.0–2.5)
ALT: 24 U/L (ref 6–29)
AST: 27 U/L (ref 10–35)
Albumin: 4.2 g/dL (ref 3.6–5.1)
Alkaline phosphatase (APISO): 79 U/L (ref 37–153)
BUN/Creatinine Ratio: 18 (calc) (ref 6–22)
BUN: 17 mg/dL (ref 7–25)
CO2: 23 mmol/L (ref 20–32)
Calcium: 9.2 mg/dL (ref 8.6–10.4)
Chloride: 106 mmol/L (ref 98–110)
Creat: 0.97 mg/dL — ABNORMAL HIGH (ref 0.60–0.88)
GFR, Est African American: 61 mL/min/{1.73_m2} (ref >=60)
GFR, Est Non African American: 53 mL/min/{1.73_m2} — ABNORMAL LOW (ref >=60)
Globulin: 2.4 g/dL (calc) (ref 1.9–3.7)
Glucose, Bld: 85 mg/dL (ref 65–139)
Potassium: 4.4 mmol/L (ref 3.5–5.3)
Sodium: 139 mmol/L (ref 135–146)
Total Bilirubin: 0.6 mg/dL (ref 0.2–1.2)
Total Protein: 6.6 g/dL (ref 6.1–8.1)

## 2020-03-05 LAB — IRON,TIBC AND FERRITIN PANEL
%SAT: 22 % (calc) (ref 16–45)
Ferritin: 71 ng/mL (ref 16–288)
Iron: 56 ug/dL (ref 45–160)
TIBC: 251 mcg/dL (calc) (ref 250–450)

## 2020-03-07 ENCOUNTER — Other Ambulatory Visit: Payer: Self-pay

## 2020-03-07 DIAGNOSIS — D649 Anemia, unspecified: Secondary | ICD-10-CM

## 2020-03-11 ENCOUNTER — Other Ambulatory Visit: Payer: Self-pay

## 2020-03-11 ENCOUNTER — Other Ambulatory Visit: Payer: Self-pay | Admitting: *Deleted

## 2020-03-11 ENCOUNTER — Other Ambulatory Visit: Payer: Medicare HMO

## 2020-03-11 DIAGNOSIS — Z8673 Personal history of transient ischemic attack (TIA), and cerebral infarction without residual deficits: Secondary | ICD-10-CM

## 2020-03-11 DIAGNOSIS — D649 Anemia, unspecified: Secondary | ICD-10-CM

## 2020-03-11 DIAGNOSIS — E785 Hyperlipidemia, unspecified: Secondary | ICD-10-CM

## 2020-03-11 LAB — CBC WITH DIFFERENTIAL/PLATELET
Absolute Monocytes: 756 cells/uL (ref 200–950)
Basophils Absolute: 97 cells/uL (ref 0–200)
Basophils Relative: 0.9 %
Eosinophils Absolute: 562 cells/uL — ABNORMAL HIGH (ref 15–500)
Eosinophils Relative: 5.2 %
HCT: 28.2 % — ABNORMAL LOW (ref 35.0–45.0)
Hemoglobin: 8.7 g/dL — ABNORMAL LOW (ref 11.7–15.5)
Lymphs Abs: 1706 cells/uL (ref 850–3900)
MCH: 31.6 pg (ref 27.0–33.0)
MCHC: 30.9 g/dL — ABNORMAL LOW (ref 32.0–36.0)
MCV: 102.5 fL — ABNORMAL HIGH (ref 80.0–100.0)
Monocytes Relative: 7 %
Neutro Abs: 7679 cells/uL (ref 1500–7800)
Neutrophils Relative %: 71.1 %
Platelets: 182 10*3/uL (ref 140–400)
RBC: 2.75 10*6/uL — ABNORMAL LOW (ref 3.80–5.10)
RDW: 20.4 % — ABNORMAL HIGH (ref 11.0–15.0)
Total Lymphocyte: 15.8 %
WBC: 10.8 10*3/uL (ref 3.8–10.8)

## 2020-03-11 MED ORDER — LISINOPRIL 20 MG PO TABS: 20.0000 mg | ORAL_TABLET | Freq: Every day | ORAL | 1 refills | Status: DC

## 2020-03-11 MED ORDER — CLOPIDOGREL BISULFATE 75 MG PO TABS: 75.0000 mg | ORAL_TABLET | Freq: Every day | ORAL | 1 refills | Status: DC

## 2020-03-11 MED ORDER — ATORVASTATIN CALCIUM 80 MG PO TABS: 80.0000 mg | ORAL_TABLET | Freq: Every day | ORAL | 1 refills | Status: DC

## 2020-03-11 NOTE — Telephone Encounter (Signed)
[facility] 

## 2020-03-18 ENCOUNTER — Other Ambulatory Visit: Payer: Self-pay

## 2020-03-18 ENCOUNTER — Encounter: Payer: Self-pay | Admitting: Nurse Practitioner

## 2020-03-18 ENCOUNTER — Encounter: Payer: Medicare HMO | Admitting: Nurse Practitioner

## 2020-03-18 ENCOUNTER — Telehealth: Payer: Self-pay

## 2020-03-18 NOTE — Telephone Encounter (Signed)
I made 3 unsuccessful attempts to contact patient. I also reached out to her daughter who was unaware that she had an appointment today and stated she would try to reach her as well. I informed Eunice Blase that after 3rd attempt we will need patient to call and reschedule, Debbie verbalized understanding.   1st attempt 8:55 am 2nd attempt 9:00 am Called daughter: 9:05 am 3rd attempt 9:15 am   Administrative staff was informed to un-arrive visit and to follow protocol to reschedule appointment.

## 2020-04-15 ENCOUNTER — Observation Stay (HOSPITAL_COMMUNITY): Payer: Medicare HMO

## 2020-04-15 ENCOUNTER — Emergency Department (HOSPITAL_COMMUNITY): Payer: Medicare HMO

## 2020-04-15 ENCOUNTER — Encounter (HOSPITAL_COMMUNITY): Payer: Self-pay | Admitting: *Deleted

## 2020-04-15 ENCOUNTER — Observation Stay (HOSPITAL_COMMUNITY)
Admission: EM | Admit: 2020-04-15 | Discharge: 2020-04-16 | Disposition: A | Discharge status: Home / Self Care | Payer: Medicare HMO | Attending: Internal Medicine | Admitting: Internal Medicine

## 2020-04-15 ENCOUNTER — Other Ambulatory Visit: Payer: Self-pay

## 2020-04-15 ENCOUNTER — Ambulatory Visit (INDEPENDENT_AMBULATORY_CARE_PROVIDER_SITE_OTHER): Payer: Medicare HMO | Admitting: Family

## 2020-04-15 ENCOUNTER — Encounter: Payer: Self-pay | Admitting: Family

## 2020-04-15 ENCOUNTER — Other Ambulatory Visit: Payer: Self-pay | Admitting: Family

## 2020-04-15 VITALS — BP 140/80 | HR 80 | Temp 97.1°F | Resp 16 | Ht 59.0 in | Wt 101.6 lb

## 2020-04-15 DIAGNOSIS — I129 Hypertensive chronic kidney disease with stage 1 through stage 4 chronic kidney disease, or unspecified chronic kidney disease: Secondary | ICD-10-CM | POA: Insufficient documentation

## 2020-04-15 DIAGNOSIS — G459 Transient cerebral ischemic attack, unspecified: Secondary | ICD-10-CM | POA: Diagnosis not present

## 2020-04-15 DIAGNOSIS — R569 Unspecified convulsions: Secondary | ICD-10-CM

## 2020-04-15 DIAGNOSIS — Z87891 Personal history of nicotine dependence: Secondary | ICD-10-CM | POA: Diagnosis not present

## 2020-04-15 DIAGNOSIS — Z8673 Personal history of transient ischemic attack (TIA), and cerebral infarction without residual deficits: Secondary | ICD-10-CM | POA: Diagnosis not present

## 2020-04-15 DIAGNOSIS — D539 Nutritional anemia, unspecified: Secondary | ICD-10-CM | POA: Diagnosis present

## 2020-04-15 DIAGNOSIS — R2689 Other abnormalities of gait and mobility: Secondary | ICD-10-CM | POA: Insufficient documentation

## 2020-04-15 DIAGNOSIS — R471 Dysarthria and anarthria: Secondary | ICD-10-CM | POA: Diagnosis present

## 2020-04-15 DIAGNOSIS — I10 Essential (primary) hypertension: Secondary | ICD-10-CM

## 2020-04-15 DIAGNOSIS — N1831 Chronic kidney disease, stage 3a: Secondary | ICD-10-CM | POA: Diagnosis not present

## 2020-04-15 DIAGNOSIS — Z20822 Contact with and (suspected) exposure to covid-19: Secondary | ICD-10-CM | POA: Diagnosis not present

## 2020-04-15 DIAGNOSIS — Z79899 Other long term (current) drug therapy: Secondary | ICD-10-CM | POA: Insufficient documentation

## 2020-04-15 DIAGNOSIS — R4781 Slurred speech: Secondary | ICD-10-CM

## 2020-04-15 DIAGNOSIS — D72829 Elevated white blood cell count, unspecified: Secondary | ICD-10-CM

## 2020-04-15 DIAGNOSIS — N183 Chronic kidney disease, stage 3 unspecified: Secondary | ICD-10-CM | POA: Diagnosis present

## 2020-04-15 LAB — DIFFERENTIAL
Abs Immature Granulocytes: 0.18 10*3/uL — ABNORMAL HIGH (ref 0.00–0.07)
Basophils Absolute: 0.1 10*3/uL (ref 0.0–0.1)
Basophils Relative: 1 %
Eosinophils Absolute: 0.3 10*3/uL (ref 0.0–0.5)
Eosinophils Relative: 3 %
Immature Granulocytes: 2 %
Lymphocytes Relative: 11 %
Lymphs Abs: 1.4 10*3/uL (ref 0.7–4.0)
Monocytes Absolute: 0.9 10*3/uL (ref 0.1–1.0)
Monocytes Relative: 7 %
Neutro Abs: 9.3 10*3/uL — ABNORMAL HIGH (ref 1.7–7.7)
Neutrophils Relative %: 76 %

## 2020-04-15 LAB — I-STAT CHEM 8, ED
BUN: 31 mg/dL — ABNORMAL HIGH (ref 8–23)
Calcium, Ion: 1.22 mmol/L (ref 1.15–1.40)
Chloride: 98 mmol/L (ref 98–111)
Creatinine, Ser: 1 mg/dL (ref 0.44–1.00)
Glucose, Bld: 109 mg/dL — ABNORMAL HIGH (ref 70–99)
HCT: 29 % — ABNORMAL LOW (ref 36.0–46.0)
Hemoglobin: 9.9 g/dL — ABNORMAL LOW (ref 12.0–15.0)
Potassium: 4.8 mmol/L (ref 3.5–5.1)
Sodium: 132 mmol/L — ABNORMAL LOW (ref 135–145)
TCO2: 26 mmol/L (ref 22–32)

## 2020-04-15 LAB — COMPREHENSIVE METABOLIC PANEL
ALT: 34 U/L (ref 0–44)
AST: 30 U/L (ref 15–41)
Albumin: 4.1 g/dL (ref 3.5–5.0)
Alkaline Phosphatase: 77 U/L (ref 38–126)
Anion gap: 11 (ref 5–15)
BUN: 27 mg/dL — ABNORMAL HIGH (ref 8–23)
CO2: 23 mmol/L (ref 22–32)
Calcium: 9.3 mg/dL (ref 8.9–10.3)
Chloride: 96 mmol/L — ABNORMAL LOW (ref 98–111)
Creatinine, Ser: 1.02 mg/dL — ABNORMAL HIGH (ref 0.44–1.00)
GFR, Estimated: 53 mL/min — ABNORMAL LOW (ref >60)
Glucose, Bld: 117 mg/dL — ABNORMAL HIGH (ref 70–99)
Potassium: 4.8 mmol/L (ref 3.5–5.1)
Sodium: 130 mmol/L — ABNORMAL LOW (ref 135–145)
Total Bilirubin: 0.9 mg/dL (ref 0.3–1.2)
Total Protein: 6.6 g/dL (ref 6.5–8.1)

## 2020-04-15 LAB — CBC
HCT: 30.2 % — ABNORMAL LOW (ref 36.0–46.0)
Hemoglobin: 9 g/dL — ABNORMAL LOW (ref 12.0–15.0)
MCH: 31.7 pg (ref 26.0–34.0)
MCHC: 29.8 g/dL — ABNORMAL LOW (ref 30.0–36.0)
MCV: 106.3 fL — ABNORMAL HIGH (ref 80.0–100.0)
Platelets: 166 10*3/uL (ref 150–400)
RBC: 2.84 MIL/uL — ABNORMAL LOW (ref 3.87–5.11)
RDW: 21.3 % — ABNORMAL HIGH (ref 11.5–15.5)
WBC: 12.1 10*3/uL — ABNORMAL HIGH (ref 4.0–10.5)
nRBC: 0.4 % — ABNORMAL HIGH (ref 0.0–0.2)

## 2020-04-15 LAB — PROTIME-INR
INR: 1.2 (ref 0.8–1.2)
Prothrombin Time: 14.4 seconds (ref 11.4–15.2)

## 2020-04-15 LAB — CBG MONITORING, ED: Glucose-Capillary: 111 mg/dL — ABNORMAL HIGH (ref 70–99)

## 2020-04-15 LAB — APTT: aPTT: 36 seconds (ref 24–36)

## 2020-04-15 LAB — SARS CORONAVIRUS 2 BY RT PCR (HOSPITAL ORDER, PERFORMED IN ~~LOC~~ HOSPITAL LAB): SARS Coronavirus 2: NEGATIVE

## 2020-04-15 MED ORDER — ATORVASTATIN CALCIUM 10 MG PO TABS
80.0000 mg | ORAL_TABLET | Freq: Every day | ORAL | Status: DC
Start: 2020-04-16 — End: 2020-04-16

## 2020-04-15 MED ORDER — ACETAMINOPHEN 650 MG RE SUPP: 650.0000 mg | RECTAL | Status: DC | PRN

## 2020-04-15 MED ORDER — ACETAMINOPHEN 160 MG/5ML PO SOLN
650.0000 mg | ORAL | Status: DC | PRN
Start: 2020-04-15 — End: 2020-04-16

## 2020-04-15 MED ORDER — SODIUM CHLORIDE 0.9% FLUSH
3.0000 mL | Freq: Once | INTRAVENOUS | Status: AC
  Administered 2020-04-15: 3 mL via INTRAVENOUS

## 2020-04-15 MED ORDER — ASPIRIN EC 81 MG PO TBEC
81.0000 mg | DELAYED_RELEASE_TABLET | Freq: Every day | ORAL | Status: DC
  Administered 2020-04-16: 81 mg via ORAL
  Filled 2020-04-15: qty 1

## 2020-04-15 MED ORDER — ASPIRIN 300 MG RE SUPP: 300.0000 mg | Freq: Every day | RECTAL | Status: DC

## 2020-04-15 MED ORDER — SODIUM CHLORIDE 0.9 % IV SOLN: INTRAVENOUS | Status: DC

## 2020-04-15 MED ORDER — IOHEXOL 350 MG/ML SOLN
75.0000 mL | Freq: Once | INTRAVENOUS | Status: AC | PRN
  Administered 2020-04-15: 75 mL via INTRAVENOUS

## 2020-04-15 MED ORDER — FLUTICASONE PROPIONATE 50 MCG/ACT NA SUSP: 1.0000 | Freq: Every day | NASAL | Status: DC | PRN

## 2020-04-15 MED ORDER — ACETAMINOPHEN 325 MG PO TABS: 650.0000 mg | ORAL_TABLET | ORAL | Status: DC | PRN

## 2020-04-15 MED ORDER — STROKE: EARLY STAGES OF RECOVERY BOOK
Freq: Once | Status: DC
  Filled 2020-04-15: qty 1

## 2020-04-15 MED ORDER — SENNOSIDES-DOCUSATE SODIUM 8.6-50 MG PO TABS: 1.0000 | ORAL_TABLET | Freq: Every evening | ORAL | Status: DC | PRN

## 2020-04-15 MED ORDER — ASPIRIN 325 MG PO TABS
325.0000 mg | ORAL_TABLET | Freq: Every day | ORAL | Status: DC
  Administered 2020-04-15: 325 mg via ORAL
  Filled 2020-04-15: qty 1

## 2020-04-15 MED ORDER — CLOPIDOGREL BISULFATE 75 MG PO TABS
75.0000 mg | ORAL_TABLET | Freq: Every day | ORAL | Status: DC
  Administered 2020-04-16: 75 mg via ORAL
  Filled 2020-04-15: qty 1

## 2020-04-15 MED ORDER — SODIUM CHLORIDE 0.9 % IV SOLN: INTRAVENOUS | Status: AC

## 2020-04-15 MED ORDER — ENOXAPARIN SODIUM 40 MG/0.4ML ~~LOC~~ SOLN
40.0000 mg | SUBCUTANEOUS | Status: DC
  Administered 2020-04-15: 40 mg via SUBCUTANEOUS
  Filled 2020-04-15: qty 0.4

## 2020-04-15 MED ORDER — LISINOPRIL 20 MG PO TABS
20.0000 mg | ORAL_TABLET | Freq: Every day | ORAL | Status: DC
Start: 2020-04-16 — End: 2020-04-16
  Administered 2020-04-16: 20 mg via ORAL
  Filled 2020-04-15: qty 1

## 2020-04-15 NOTE — Patient Instructions (Addendum)
CT scan had been ordered  at [place] at [facility] but will cancel.  - Fall and safety precaution  - please go to ED via EMS for evaluation of slurred speech   Seizure, Adult A seizure is a sudden burst of abnormal electrical and chemical activity in the brain. Seizures usually last from 30 seconds to 2 minutes.  What are the causes? Common causes of this condition include:  Fever or infection.  Problems that affect the brain. These may include: ? A brain or head injury. ? Bleeding in the brain. ? A brain tumor.  Low levels of blood sugar or salt.  Kidney problems or liver problems.  Conditions that are passed from parent to child (are inherited).  Problems with a substance, such as: ? Having a reaction to a drug or a medicine. ? Stopping the use of a substance all of a sudden (withdrawal).  A stroke.  Disorders that affect how you develop. Sometimes, the cause may not be known.  What increases the risk?  Having someone in your family who has epilepsy. In this condition, seizures happen again and again over time. They have no clear cause.  Having had a tonic-clonic seizure before. This type of seizure causes you to: ? Tighten the muscles of the whole body. ? Lose consciousness.  Having had a head injury or strokes before.  Having had a lack of oxygen at birth. What are the signs or symptoms? There are many types of seizures. The symptoms vary depending on the type of seizure you have. Symptoms during a seizure  Shaking that you cannot control (convulsions) with fast, jerky movements of muscles.  Stiffness of the body.  Breathing problems.  Feeling mixed up (confused).  Staring or not responding to sound or touch.  Head nodding.  Eyes that blink, flutter, or move fast.  Drooling, grunting, or making clicking sounds with your mouth  Losing control of when you pee or poop. Symptoms before a seizure  Feeling afraid, nervous, or  worried.  Feeling like you may vomit.  Feeling like: ? You are moving when you are not. ? Things around you are moving when they are not.  Feeling like you saw or heard something before (dj vu).  Odd tastes or smells.  Changes in how you see. You may see flashing lights or spots. Symptoms after a seizure  Feeling confused.  Feeling sleepy.  Headache.  Sore muscles. How is this treated? If your seizure stops on its own, you will not need treatment. If your seizure lasts longer than 5 minutes, you will normally need treatment. Treatment may include:  Medicines given through an IV tube.  Avoiding things, such as medicines, that are known to cause your seizures.  Medicines to prevent seizures.  A device to prevent or control seizures.  Surgery.  A diet low in carbohydrates and high in fat (ketogenic diet). Follow these instructions at home: Medicines  Take over-the-counter and prescription medicines only as told by your doctor.  Avoid foods or drinks that may keep your medicine from working, such as alcohol. Activity  Follow instructions about driving, swimming, or doing things that would be dangerous if you had another seizure. Wait until your doctor says it is safe for you to do these things.  If you live in the [place] ask your local department of motor vehicles when you can drive.  Get a lot of rest. Teaching others  Teach friends and family what to do when  you have a seizure. They should: ? Help you get down to the ground. ? Protect your head and body. ? Loosen any clothing around your neck. ? Turn you on your side. ? Know whether or not you need emergency care. ? Stay with you until you are better.  Also, tell them what not to do if you have a seizure. Tell them: ? They should not hold you down. ? They should not put anything in your mouth.   General instructions  Avoid anything that gives you seizures.  Keep a seizure diary. Write down: ? What you  remember about each seizure. ? What you think caused each seizure.  Keep all follow-up visits. Contact a doctor if:  You have another seizure or seizures. Call the doctor each time you have a seizure.  The pattern of your seizures changes.  You keep having seizures with treatment.  You have symptoms of being sick or having an infection.  You are not able to take your medicine. Get help right away if:  You have any of these problems: ? A seizure that lasts longer than 5 minutes. ? Many seizures in a row and you do not feel better between seizures. ? A seizure that makes it harder to breathe. ? A seizure and you can no longer speak or use part of your body.  You do not wake up right after a seizure.  You get hurt during a seizure.  You feel confused or have pain right after a seizure. These symptoms may be an emergency. Get help right away. Call your local emergency services (911 in the [place]  Do not wait to see if the symptoms will go away.  Do not drive yourself to the hospital. Summary  A seizure is a sudden burst of abnormal electrical and chemical activity in the brain. Seizures normally last from 30 seconds to 2 minutes.  Causes of seizures include illness, injury to the head, low levels of blood sugar or salt, and certain conditions.  Most seizures will stop on their own in less than 5 minutes. Seizures that last longer than 5 minutes are a medical emergency and need treatment right away.  Many medicines are used to treat seizures. Take over-the-counter and prescription medicines only as told by your doctor. This information is not intended to replace advice given to you by your health care provider. Make sure you discuss any questions you have with your health care provider. Document Revised: 09/15/2019 Document Reviewed: 09/15/2019 Elsevier Patient Education  [place].

## 2020-04-15 NOTE — ED Notes (Signed)
Patient transported to CT 

## 2020-04-15 NOTE — ED Notes (Signed)
Pt transported to MRI 

## 2020-04-15 NOTE — ED Provider Notes (Signed)
[facility] EMERGENCY DEPARTMENT Provider Note   CSN: [id number] Arrival date & time: 04/15/20  1329     History Chief Complaint  Patient presents with  . Stroke Symptoms    Resolved tia symptoms of confusion and dysphagia    Sarah Hampton is a 87 y.o. female.  Patient with history of TIA, hypertension, hyperlipidemia --presents to the emergency department for evaluation of seizure-like activity and dysarthria.  Patient was with her family last evening and she had a 15-second episode of upper extremity shaking.  Shaking was bilateral.  Patient slumped to the side and when she was set up by her daughter, symptoms resolved.  She did not have any confusion after that and was normal per family.  She was seen by her primary care provider today for evaluation of this episode and during the exam she developed a 10-minute episode of nearly incomprehensible speech.  Patient also reports the sensation of trying to get words out but could not.  She states that this was similar to previous episode of TIA.  Patient was more confused than baseline after this episode per daughter.  Patient was transported to the hospital by EMS.  Her symptoms have remained resolved.  Patient denies any recent symptoms of illness or headache.  Sounds like she is compliant with her medications.  The onset of this condition was acute. The course is constant. Aggravating factors: none. Alleviating factors: none.          Past Medical History:  Diagnosis Date  . Arthritis   . History of melanoma    shoulder  . History of squamous cell carcinoma   . History of TIA (transient ischemic attack)   . Hypertension   . Macrocytic anemia   . Mini stroke [facility])    Per [facility] New Patient Packet   . Other hyperlipidemia   . Personal history of malignant neoplasm of other organs and systems     Patient Active Problem List   Diagnosis Date Noted  . TIA (transient ischemic attack) 09/28/2017  . Essential  hypertension 09/28/2017  . CKD (chronic kidney disease), stage III ([place]) 09/28/2017    Past Surgical History:  Procedure Laterality Date  . CATARACT EXTRACTION    . DENTAL SURGERY    . SQUAMOUS CELL CARCINOMA EXCISION    . TONSILLECTOMY  1946   Per [facility] New Patient Packet      OB History   No obstetric history on file.     Family History  Problem Relation Age of Onset  . Dementia Mother 66  . Breast cancer Mother   . Pneumonia Father 66  . Cancer Father   . Stroke Neg Hx     Social History   Tobacco Use  . Smoking status: Former Smoker    Packs/day: 0.50    Years: 30.00    Pack years: 15.00    Types: Cigarettes    Quit date: 09/28/1968    Years since quitting: 51.5  . Smokeless tobacco: Never Used  Vaping Use  . Vaping Use: Never used  Substance Use Topics  . Alcohol use: Yes    Comment: rare- 1 glass of wine  . Drug use: Never    Home Medications Prior to Admission medications   Medication Sig Start Date End Date Taking? Authorizing Provider  atorvastatin (LIPITOR) 80 MG tablet Take 1 tablet (80 mg total) by mouth daily at 6 PM. 03/11/20   Lauree Chandler, NP  calcium carbonate (OSCAL) 1500 (600  Ca) MG TABS tablet Take 1 tablet by mouth daily at 12 noon. 10/16/16   [provider]  clopidogrel (PLAVIX) 75 MG tablet Take 1 tablet (75 mg total) by mouth daily. 03/11/20   Lauree Chandler, NP  lisinopril (ZESTRIL) 20 MG tablet Take 1 tablet (20 mg total) by mouth daily. 03/11/20   Lauree Chandler, NP  UNABLE TO FIND CBD Recovery Cream 300mg  (2oz/12mL) use twice weekly for had pain    [provider]    Allergies    Patient has no known allergies.  Review of Systems   Review of Systems  Constitutional: Negative for fever.  HENT: Negative for rhinorrhea and sore throat.   Eyes: Negative for redness.  Respiratory: Negative for cough.   Cardiovascular: Negative for chest pain.  Gastrointestinal: Negative for abdominal pain, diarrhea,  nausea and vomiting.  Genitourinary: Negative for dysuria, frequency, hematuria and urgency.  Musculoskeletal: Negative for myalgias.  Skin: Negative for rash.  Neurological: Positive for seizures and speech difficulty. Negative for facial asymmetry and headaches.    Physical Exam Updated Vital Signs BP (!) 162/130 (BP Location: Right Arm)   Pulse 80   Temp 98 F (36.7 C) (Oral)   Resp (!) 22   SpO2 99%   Physical Exam Vitals and nursing note reviewed.  Constitutional:      Appearance: She is well-developed and well-nourished.  HENT:     Head: Normocephalic and atraumatic.     Right Ear: Tympanic membrane, ear canal and external ear normal.     Left Ear: Tympanic membrane, ear canal and external ear normal.     Nose: Nose normal.     Mouth/Throat:     Mouth: Oropharynx is clear and moist and mucous membranes are normal.     Pharynx: Uvula midline.  Eyes:     General: Lids are normal.     Extraocular Movements: EOM normal.     Right eye: No nystagmus.     Left eye: No nystagmus.     Conjunctiva/sclera: Conjunctivae normal.     Pupils: Pupils are equal, round, and reactive to light.  Neck:     Comments: No carotid bruit.  Cardiovascular:     Rate and Rhythm: Normal rate and regular rhythm.  Pulmonary:     Effort: Pulmonary effort is normal.     Breath sounds: Normal breath sounds.  Abdominal:     Palpations: Abdomen is soft.     Tenderness: There is no abdominal tenderness.  Musculoskeletal:     Cervical back: Normal range of motion and neck supple. No tenderness or bony tenderness. Normal range of motion.  Skin:    General: Skin is warm and dry.  Neurological:     Mental Status: She is alert and oriented to person, place, and time.     GCS: GCS eye subscore is 4. GCS verbal subscore is 5. GCS motor subscore is 6.     Cranial Nerves: No cranial nerve deficit.     Sensory: No sensory deficit.     Coordination: She displays a negative Romberg sign. Coordination  normal.     Gait: Gait normal.     Deep Tendon Reflexes: Strength normal and reflexes are normal and symmetric.  Psychiatric:        Mood and Affect: Mood and affect normal.     ED Results / Procedures / Treatments   Labs (all labs ordered are listed, but only abnormal results are displayed) Labs Reviewed  CBC -  Abnormal; Notable for the following components:      Result Value   WBC 12.1 (*)    RBC 2.84 (*)    Hemoglobin 9.0 (*)    HCT 30.2 (*)    MCV 106.3 (*)    MCHC 29.8 (*)    RDW 21.3 (*)    nRBC 0.4 (*)    All other components within normal limits  DIFFERENTIAL - Abnormal; Notable for the following components:   Neutro Abs 9.3 (*)    Abs Immature Granulocytes 0.18 (*)    All other components within normal limits  COMPREHENSIVE METABOLIC PANEL - Abnormal; Notable for the following components:   Sodium 130 (*)    Chloride 96 (*)    Glucose, Bld 117 (*)    BUN 27 (*)    Creatinine, Ser 1.02 (*)    GFR, Estimated 53 (*)    All other components within normal limits  I-STAT CHEM 8, ED - Abnormal; Notable for the following components:   Sodium 132 (*)    BUN 31 (*)    Glucose, Bld 109 (*)    Hemoglobin 9.9 (*)    HCT 29.0 (*)    All other components within normal limits  CBG MONITORING, ED - Abnormal; Notable for the following components:   Glucose-Capillary 111 (*)    All other components within normal limits  SARS CORONAVIRUS 2 BY RT PCR (HOSPITAL ORDER, [facility])  PROTIME-INR  APTT    EKG None  Radiology CT HEAD WO CONTRAST  Result Date: 04/15/2020 CLINICAL DATA:  TIA. Possible seizure last night. Episode of slurred speech and confusion this morning, now resolved. EXAM: CT HEAD WITHOUT CONTRAST TECHNIQUE: Contiguous axial images were obtained from the base of the skull through the vertex without intravenous contrast. COMPARISON:  Head CT and MRI 09/28/2017 FINDINGS: Brain: There is no evidence of an acute infarct, intracranial  hemorrhage, mass, midline shift, or extra-axial fluid collection. Patchy hypodensities in the cerebral white matter bilaterally are unchanged and nonspecific but compatible with moderate chronic small vessel ischemic disease. There is mild-to-moderate cerebral atrophy. Vascular: Calcified atherosclerosis at the skull base. No hyperdense vessel. Skull: No fracture or suspicious osseous lesion. Sinuses/Orbits: Visualized paranasal sinuses and mastoid air cells are clear. Bilateral cataract extraction. Other: None. IMPRESSION: 1. No evidence of acute intracranial abnormality. 2. Moderate chronic small vessel ischemic disease. Electronically Signed   By: Logan Bores M.D.   On: 04/15/2020 14:28    Procedures Procedures   Medications Ordered in ED Medications  sodium chloride flush (NS) 0.9 % injection 3 mL (has no administration in time range)    ED Course  I have reviewed the triage vital signs and the nursing notes.  Pertinent labs & imaging results that were available during my care of the patient were reviewed by me and considered in my medical decision making (see chart for details).  Patient seen and examined.  Work-up to this point reviewed.  No concerning findings.  CT of the head without acute findings.  Discussed with Dr. Roderic Palau.  Discussed with neuro hospitalist for recommendations.  Vital signs reviewed and are as follows: BP (!) 162/130 (BP Location: Right Arm)   Pulse 80   Temp 98 F (36.7 C) (Oral)   Resp (!) 22   SpO2 99%   5:40 PM Spoke with neurology, Dr. Curly Shores. Requests medical admit for TIA work-up. She will consult to determine if seizure evaluation needed.   BP (!) 135/92  Pulse 81   Temp 98 F (36.7 C) (Oral)   Resp (!) 21   SpO2 100%   Updated daughter. Pt in CT.      MDM Rules/Calculators/A&P                          Admit.   Final Clinical Impression(s) / ED Diagnoses Final diagnoses:  TIA (transient ischemic attack)  Seizure-like activity [facility])     Rx / [place] Orders ED Discharge Orders    None       Carlisle Cater, Hershal Coria 04/15/20 1743    Milton Ferguson, MD 04/15/20 2255

## 2020-04-15 NOTE — ED Notes (Signed)
Admitting md at bedside

## 2020-04-15 NOTE — ED Triage Notes (Signed)
Pt had a seizure like episode last night and it resolved, EMS came. Then this am decided to go follow up with MD and she had 10 min episode of slurred speech and aphasia along with some confusion.  This occurred at 1230 and has since resolved.  No deficits at triage.

## 2020-04-15 NOTE — Consult Note (Signed)
Neurology Consultation Reason for Consult: Transient aphasia Requesting Physician: Milton Ferguson   CC: Transient aphasia today for 15-20 minutes  History is obtained from: patient, patient's daughter at bedside, chart review  HPI: Sarah Hampton is a 87 y.o. female with a past medical history significant for hypertension, hyperlipidemia, history of melanoma and squamous cell carcinoma, history of TIA who presented to the ED 04/15/2020 from her PCP office following a 15-20 minute witnessed episode of transient aphasia followed by confusion. Patient states that she was aware of what she wanted to say but was unable to speak anything but gibberish for 15-20 minutes today and states this is a similar presentation to her previous TIA. She also notes of an episode at her daughter's house last night 04/14/20 sitting on the couch when her bilateral hands began to shake rhythmically for approximately 15 seconds with the patient leaning over on her side. She recalls the entire event and states during that time she thought "why are my hands shaking"? Denies history of similar events, loss of consciousness, incontinence, or tongue biting during this event.   Of note, she was seen in 2019 for recurrent right face and hand paresthesias as well as speech difficulties, felt to be TIA secondary to small vessel disease.  She was started on 3 weeks of dual antiplatelet therapy followed by aspirin alone. Currently she endorses taking clopidogrel monotherapy and is compliant with this medication.    LKW: 04/15/2020 12:30 tPA given?: No, symptoms resolved Premorbid modified rankin scale:  0 - No symptoms.      ROS: A 14 point ROS was performed and is negative except as noted in the HPI.   Past Medical History:  Diagnosis Date  . Arthritis   . History of melanoma    shoulder  . History of squamous cell carcinoma   . History of TIA (transient ischemic attack)   . Hypertension   . Macrocytic anemia   . Mini stroke  [facility])    Per [facility] New Patient Packet   . Other hyperlipidemia   . Personal history of malignant neoplasm of other organs and systems    Family History  Problem Relation Age of Onset  . Dementia Mother 66  . Breast cancer Mother   . Pneumonia Father 14  . Cancer Father   . Stroke Neg Hx    Social History:  reports that she quit smoking about 51 years ago. Her smoking use included cigarettes. She has a 15.00 pack-year smoking history. She has never used smokeless tobacco. She reports current alcohol use. She reports that she does not use drugs.  Exam: Current vital signs: BP (!) 135/92   Pulse 81   Temp 98 F (36.7 C) (Oral)   Resp (!) 21   SpO2 100%  Vital signs in last 24 hours: Temp:  [97.1 F (36.2 C)-98 F (36.7 C)] 98 F (36.7 C) (01/24 1349) Pulse Rate:  [68-81] 81 (01/24 1630) Resp:  [15-22] 21 (01/24 1630) BP: (135-162)/(60-130) 135/92 (01/24 1630) SpO2:  [95 %-100 %] 100 % (01/24 1638) Weight:  [46.1 kg] 46.1 kg (01/24 1141)  Physical Exam  Constitutional: Appears well-developed and well-nourished.  Psych: Affect appropriate to situation Eyes: Normal conjunctiva Head: atraumatic and normocephalic HENT: No OP obstrucion MSK: no joint deformities.  Cardiovascular: Irregular rate and rhythm on cardiac monitor Respiratory: Effort normal, non-labored breathing GI: Soft.  No distension. There is no tenderness.  Skin: WDI  Neuro: Mental Status: Patient is awake, alert, oriented to person, place,  age, month, year, and situation. She is able to give a clear and coherent history. There are no signs of aphasia or neglect. Speech is clear without aphasia or dysarthria. Follows all commands. Repetition, naming, and fluency intact. Comprehension not intact.  Cranial Nerves: II: Visual Fields are full. Pupils are equal, round, and reactive to light 2 mm/brisk. III,IV, VI: EOMI without ptosis or diploplia.  V: Facial sensation is symmetric to light touch  VII: Facial  movement is symmetric resting and smiling VIII: Hearing is intact to voice; hard of hearing at baseline with hearingaids in place X: Palate elevates symmetrically, phonation intact XI: Shoulder shrug is symmetric. XII: Tongue protrudes midline.  Motor: Tone is normal. Bulk is normal. 5/5 strength was present in all four extremities. Antigravity movement in all extremities without pronator drift.  Sensory: Sensation is symmetric to light touch in the arms and legs. Deep Tendon Reflexes: 2+ and symmetric in the biceps and patellae.  Plantars: Toes are downgoing bilaterally. Cerebellar: FNF and HKS are intact bilaterally  NIHSS total 0 Score breakdown: No deficits, all symptoms resolved PTA  I have reviewed labs in epic and the results pertinent to this consultation are: CBC    Component Value Date/Time   WBC 12.1 (H) 04/15/2020 1351   RBC 2.84 (L) 04/15/2020 1351   HGB 9.9 (L) 04/15/2020 1437   HCT 29.0 (L) 04/15/2020 1437   PLT 166 04/15/2020 1351   MCV 106.3 (H) 04/15/2020 1351   MCH 31.7 04/15/2020 1351   MCHC 29.8 (L) 04/15/2020 1351   RDW 21.3 (H) 04/15/2020 1351   LYMPHSABS 1.4 04/15/2020 1351   MONOABS 0.9 04/15/2020 1351   EOSABS 0.3 04/15/2020 1351   BASOSABS 0.1 04/15/2020 1351   CMP     Component Value Date/Time   NA 132 (L) 04/15/2020 1437   NA 132 (A) 06/10/2019 0000   K 4.8 04/15/2020 1437   CL 98 04/15/2020 1437   CO2 23 04/15/2020 1351   GLUCOSE 109 (H) 04/15/2020 1437   BUN 31 (H) 04/15/2020 1437   BUN 20 06/10/2019 0000   CREATININE 1.00 04/15/2020 1437   CREATININE 0.97 (H) 03/04/2020 1211   CALCIUM 9.3 04/15/2020 1351   PROT 6.6 04/15/2020 1351   ALBUMIN 4.1 04/15/2020 1351   AST 30 04/15/2020 1351   ALT 34 04/15/2020 1351   ALKPHOS 77 04/15/2020 1351   BILITOT 0.9 04/15/2020 1351   GFRNONAA 53 (L) 04/15/2020 1351   GFRNONAA 53 (L) 03/04/2020 1211   GFRAA 61 03/04/2020 1211   I have reviewed the images obtained: CT head IMPRESSION: 1.  No evidence of acute intracranial abnormality. 2. Moderate chronic small vessel ischemic disease.  CT angio head and neck IMPRESSION: 1. No intracranial large or medium vessel occlusion. 2. Atherosclerotic disease at both carotid bifurcations but without stenosis. 3. 30-50% stenoses at both vertebral artery origins. 30-50% stenosis of both vertebral artery V4 segments. 4. 7 mm ground-glass opacity in the right upper lobe. Emphysema and aortic atherosclerosis. Initial follow-up with CT at 6-12 months is recommended to confirm persistence. If persistent, repeat CT is recommended every 2 years until 5 years of stability has been established. This recommendation follows the consensus statement: Guidelines for Management of Incidental Pulmonary Nodules Detected on CT Images: From the Fleischner Society 2017; Radiology 2017; 284:228-243.  Impression:  87 year old female with multiple stroke risk factors including previous TIA on home clopidogrel, hypertension, and hyperlipidemia presents today from PCP office for a 15-20 minute episode of transient aphasia PTA  with subsequent confusion. Also, of note was an episode of witnessed rhythmic bilateral hand shaking last night at her daughter's house without history of such incident.  While patient has reported that she remembers the event, she does not give a very detailed history of such spontaneously, and her daughter notes that she minimizes her symptoms.  Thus it is very unclear whether this may have represented seizure or not.  CT with evidence of moderate chronic small vessel disease and atherosclerotic cerebrovascular disease. MRI brain pending at the time of NP examination, but on my personal review agree with radiology there is no acute intracranial process. Etiology likely TIA/ischemia from atherosclerotic or embolic source, though also potentially concerning for seizures given 3 similar presentations in the past 2.5 years. DDx may also include  infectious/metabolic etiology of confusion with mild leukocytosis and mild hyponatremia to 130 but unlikely given patient afebrile with resolution of symptoms.    Recommendations: - HgbA1c, fasting lipid panel - MRI, MRA of the brain without contrast - Frequent neuro checks - Echocardiogram - Prophylactic therapy- continue home clopidogrel, add aspirin 81 mg PO for 21 days  - Risk factor modification - EEG to evaluate for potential seizures - Telemetry monitoring - PT consult, OT consult, Speech consult - Stroke team to follow - Consider CT chest/abdomen/pelvis for work-up of potential osseous metastatic disease on MRI brain, defer to primary team's clinical judgment  Lesleigh Noe MD-PhD [facility] [phone]   Attending Neurologist's note:  I personally saw this patient, gathering history, performing a full neurologic examination, reviewing relevant labs, personally reviewing relevant imaging including MRI brain, and formulated the assessment and plan, adding the note above for completeness and clarity to accurately reflect my thoughts

## 2020-04-15 NOTE — Progress Notes (Signed)
Provider: Suriya Kovarik FNP-C  Lauree Chandler, NP  Patient Care Team: Lauree Chandler, NP as PCP - General (Geriatric Medicine)  Extended Emergency Contact Information Primary Emergency Contact: Melynda Ripple) Mobile Phone: [phone] Relation: Daughter Preferred language: English  Code Status: Full Code  Goals of care: Advanced Directive information Advanced Directives 04/15/2020  Does Patient Have a Medical Advance Directive? Yes  Type of Advance Directive Living will  Does patient want to make changes to medical advance directive? No - Patient declined  Copy of [place] in Chart? -     Chief Complaint  Patient presents with  . Acute Visit    Questionable seizure yesterday.     HPI:  Pt is a 87 y.o. female seen today for an acute visit for evaluation of possible seizure activity yesterday.she was sitting on the chair her hands were shaking and slumped over seems to have lost consciousness.No urine or bowel incontinence reported but daughter states after the episode she had to use the bathroom.she was evaluated by EMS at the house and Neuro check and EKG was normal.she did not go to ED.  She was able to eat and drink without any coughing or choking episode.she stated over at her daughter's place.Usually resides at [facility].   she has a significant medical history of TIA.No weakness,numbness,facial drooping or slurred speech.she denies any headache,dizziness,fatigue ,palptitation,chest tightness,chest pain or shortness of breath.Also denies any fever,chills,cough or symptoms of URI. Her blood pressure on arrival was 156/60 rechecked after resting was 140/80.  After visit provider stepped out of the Exam room to advised staff to call for a stat CT scan appointment for patient the daughter called staff back to the room states patient was having slurred speech.Provider re-evaluated patient and speech was still slurred.Staff was advised to  call 9-1-1. Patient's speech improved after 15 minutes.had some confusion but resolved.No facial drooping or weakness     Past Medical History:  Diagnosis Date  . Arthritis   . History of melanoma    shoulder  . History of squamous cell carcinoma   . History of TIA (transient ischemic attack)   . Hypertension   . Macrocytic anemia   . Mini stroke [facility])    Per [facility] New Patient Packet   . Other hyperlipidemia   . Personal history of malignant neoplasm of other organs and systems    Past Surgical History:  Procedure Laterality Date  . CATARACT EXTRACTION    . DENTAL SURGERY    . SQUAMOUS CELL CARCINOMA EXCISION    . TONSILLECTOMY  1946   Per [facility] New Patient Packet     No Known Allergies  Outpatient Encounter Medications as of 04/15/2020  Medication Sig  . atorvastatin (LIPITOR) 80 MG tablet Take 1 tablet (80 mg total) by mouth daily at 6 PM.  . calcium carbonate (OSCAL) 1500 (600 Ca) MG TABS tablet Take 1 tablet by mouth daily at 12 noon.  . clopidogrel (PLAVIX) 75 MG tablet Take 1 tablet (75 mg total) by mouth daily.  Marland Kitchen lisinopril (ZESTRIL) 20 MG tablet Take 1 tablet (20 mg total) by mouth daily.  Marland Kitchen UNABLE TO FIND CBD Recovery Cream 300mg  (2oz/69mL) use twice weekly for had pain   No facility-administered encounter medications on file as of 04/15/2020.    Review of Systems  Constitutional: Negative for appetite change, chills, fatigue and fever.  HENT: Negative for congestion, rhinorrhea, sinus pressure, sinus pain, sneezing and sore throat.   Eyes: Negative  for pain, discharge, redness, itching and visual disturbance.  Respiratory: Negative for cough, chest tightness, shortness of breath and wheezing.   Cardiovascular: Negative for chest pain, palpitations and leg swelling.  Gastrointestinal: Negative for abdominal distention, abdominal pain, constipation, diarrhea, nausea and vomiting.  Endocrine: Negative for cold intolerance, heat intolerance, polydipsia, polyphagia and  polyuria.  Genitourinary: Negative for difficulty urinating, dysuria, flank pain, frequency, hematuria and urgency.  Musculoskeletal: Positive for arthralgias, back pain and gait problem. Negative for joint swelling and myalgias.       Uses CBD cream and working with PT  Has been holding the rail  While walking in the facility. Has a cane but coordination is problem   Skin: Negative for color change, pallor and rash.  Neurological: Negative for dizziness, speech difficulty, weakness, light-headedness, numbness and headaches.  Hematological: Does not bruise/bleed easily.  Psychiatric/Behavioral: Negative for agitation, confusion and sleep disturbance. The patient is not nervous/anxious.     Immunization History  Administered Date(s) Administered  . Hep A / Hep B 02/15/2012, 03/17/2012, 08/16/2012  . Influenza Split 02/12/2009, 01/09/2010  . Influenza, High Dose Seasonal PF 01/28/2012, 02/10/2013, 01/23/2014, 02/07/2015, 12/18/2018, 01/30/2020  . Influenza, Quadrivalent, Recombinant, Inj, Pf 01/10/2018  . Influenza, Seasonal, Injecte, Preservative Fre 12/23/2010  . Influenza,inj,Quad PF,6+ Mos 01/11/2017  . Moderna Sars-Covid-2 Vaccination 04/24/2019, 05/22/2019, 01/30/2020  . Pneumococcal Conjugate-13 01/23/2014  . Pneumococcal Polysaccharide-23 01/27/2007  . Typhoid Inactivated 02/15/2012   Pertinent  Health Maintenance Due  Topic Date Due  . INFLUENZA VACCINE  Completed  . DEXA SCAN  Completed  . PNA vac Low Risk Adult  Completed   Fall Risk  04/15/2020 03/04/2020 06/15/2019 02/24/2019 01/13/2019  Falls in the past year? 0 0 0 0 0  Number falls in past yr: 0 0 0 0 0  Injury with Fall? 0 0 0 0 0   Functional Status Survey:    Vitals:   04/15/20 1141 04/15/20 1245  BP: (!) 156/60 140/80  Pulse: 80   Resp: 16   Temp: (!) 97.1 F (36.2 C)   SpO2: 95%   Weight: 101 lb 9.6 oz (46.1 kg)   Height: 4\' 11"  (1.499 m)    Body mass index is 20.52 kg/m. Physical Exam Vitals  reviewed.  Constitutional:      General: She is not in acute distress.    Appearance: She is normal weight. She is not ill-appearing.  HENT:     Head: Normocephalic.     Right Ear: Tympanic membrane, ear canal and external ear normal. There is no impacted cerumen.     Left Ear: Tympanic membrane, ear canal and external ear normal. There is no impacted cerumen.     Ears:     Comments: Bilateral hearing aids     Nose: Nose normal. No congestion or rhinorrhea.     Mouth/Throat:     Mouth: Mucous membranes are moist.     Pharynx: Oropharynx is clear. No oropharyngeal exudate or posterior oropharyngeal erythema.  Eyes:     General: No scleral icterus.       Right eye: No discharge.        Left eye: No discharge.     Extraocular Movements: Extraocular movements intact.     Conjunctiva/sclera: Conjunctivae normal.     Pupils: Pupils are equal, round, and reactive to light.  Neck:     Vascular: No carotid bruit.  Cardiovascular:     Rate and Rhythm: Normal rate and regular rhythm.     Pulses:  Normal pulses.     Heart sounds: Normal heart sounds. No murmur heard. No friction rub. No gallop.   Pulmonary:     Effort: Pulmonary effort is normal. No respiratory distress.     Breath sounds: Normal breath sounds. No wheezing, rhonchi or rales.  Chest:     Chest wall: No tenderness.  Abdominal:     General: Bowel sounds are normal. There is no distension.     Palpations: Abdomen is soft. There is no mass.     Tenderness: There is no abdominal tenderness. There is no right CVA tenderness, left CVA tenderness, guarding or rebound.  Musculoskeletal:        General: No swelling or tenderness.     Cervical back: Normal range of motion. No rigidity or tenderness.     Right lower leg: No edema.     Left lower leg: No edema.     Comments: Unsteady gait walks with a cane   Lymphadenopathy:     Cervical: No cervical adenopathy.  Skin:    General: Skin is warm and dry.     Coloration: Skin is not  pale.     Findings: No bruising or erythema.  Neurological:     Mental Status: She is alert and oriented to person, place, and time.     Cranial Nerves: No cranial nerve deficit.     Sensory: No sensory deficit.     Motor: No weakness.     Coordination: Coordination normal.     Gait: Gait abnormal.     Comments: Slurred speech resolved   Psychiatric:        Mood and Affect: Mood normal.        Behavior: Behavior normal.        Thought Content: Thought content normal.        Judgment: Judgment normal.     Labs reviewed: Recent Labs    06/10/19 0000 08/30/19 1135 03/04/20 1211  NA 132* 135 139  K 4.5 4.8 4.4  CL 99 103 106  CO2  --  25 23  GLUCOSE  --  88 85  BUN 20 21 17   CREATININE 1.0 0.98* 0.97*  CALCIUM 9.1 9.3 9.2   Recent Labs    06/10/19 0000 08/30/19 1135 03/04/20 1211  AST 24 26 27   ALT 26 36* 24  ALKPHOS 63  --   --   BILITOT  --  0.5 0.6  PROT  --  6.5 6.6   Recent Labs    08/30/19 1135 03/04/20 1211 03/11/20 0948  WBC 9.6 10.9* 10.8  NEUTROABS 6,106 7,521 7,679  HGB 9.4* 8.7* 8.7*  HCT 31.1* 28.7* 28.2*  MCV 102.3* 104.0* 102.5*  PLT 177 184 182   Lab Results  Component Value Date   TSH 2.26 06/10/2019   Lab Results  Component Value Date   HGBA1C 5.2 09/28/2017   Lab Results  Component Value Date   CHOL 110 06/10/2019   HDL 57 06/10/2019   LDLCALC 38 06/10/2019   TRIG 74 06/10/2019   CHOLHDL 2.2 02/22/2019    Significant Diagnostic Results in last 30 days:  No results found.  Assessment/Plan 1. Seizure [facility]) Daughter reports seizure like activity on upper extremities and slumping over yesterday while sitting.symptoms resolved with assistance to sitting position.confusion resolved.No B/B incontinence reported.EMS was called but condition was stable. Neuro exam intact today but later during visit developed slurred speech when provider had stepped out to order stat CT scan.[facility] staff advised to call  and cancel CT scan.  - CT HEAD  WO CONTRAST; Future - EMS called and send to ED escorted by daughter in stable condition.   2. History of TIA (transient ischemic attack) Slurred speech during visit as above.  - Send to ED via EMS  - [place]; Future  3. Slurred speech Send to ED via EMS   Family/ staff Communication: Reviewed plan of care with patient and Daughter.Send to ED via  EMS  Labs/tests ordered: Send to ED via EMS   Next Appointment:Has appointment 07/08/2020 with PCP    Sandrea Hughs, NP

## 2020-04-15 NOTE — ED Notes (Signed)
Pt assisted to bathroom, ambulatory with steady gait.

## 2020-04-15 NOTE — H&P (Addendum)
History and Physical    [name] WNI:[id number] DOB: [birth year] DOA: 04/15/2020  Referring MD/NP/PA: Suann Larry PCP: Lauree Chandler, NP  Patient coming from: Home   Chief Complaint: Unable to speak  I have personally briefly reviewed patient's old medical records in [place]   HPI: Sarah Hampton is a 87 y.o. female with medical history significant of hypertension, hyperlipidemia, TIAs, macrocytic anemia, history of melanoma shoulder presents after having an episode while at her PCP office today of being unable to speak.  Yesterday, afternoon while sitting on the couch before dinner patient had an episode of 15 seconds of both of her hands shaking witnessed by patient's son-in-law.  She recalls being aware that her hands were shaking, but was unable to control them.  The son-in-law yelled yelled and her daughter came into the room.  She found her leaned over on a couch and was able to get her back upright and stated that she was able to focus and answered appropriately.  This is the reason why she was taken to the doctor's office today.  And sometime around noon today had the  15 to 20-minute episode where she was unable to speak and seem confused.  She knew what she wanted to say but just was unable to get the words out.  Patient denies having any headache, visual changes, loss of consciousness, tongue biting, or focal weakness.  Patient had had 2 prior TIAs in the last 2 years for  which she has had difficulty speaking before.  She is currently on Plavix which she takes daily.  ED Course: Upon admission into the emergency department patient was seen to be afebrile with blood pressures 135/92 -162/130, respirations 15-22, and all other vital signs maintained. CTA of the head and neck showed no acute intracranial abnormality or large vessel obstruction.  Labs significant for WBC 12.1, hemoglobin 9.0, sodium 130, BUN 27, and creatinine 1.02.  X-rays showed no acute  abnormalities.  TRH called to admit for completion of stroke work-up.  Review of Systems  Constitutional: Negative for chills and weight loss.  HENT: Negative for congestion and ear discharge.   Eyes: Negative for photophobia and pain.  Respiratory: Negative for cough and shortness of breath.   Cardiovascular: Negative for chest pain and leg swelling.  Gastrointestinal: Negative for abdominal pain, nausea and vomiting.  Genitourinary: Negative for dysuria and frequency.  Musculoskeletal: Negative for falls and joint pain.  Neurological: Positive for tremors and speech change. Negative for loss of consciousness.  Psychiatric/Behavioral: Negative for memory loss and substance abuse.    Past Medical History:  Diagnosis Date  . Arthritis   . History of melanoma    shoulder  . History of squamous cell carcinoma   . History of TIA (transient ischemic attack)   . Hypertension   . Macrocytic anemia   . Mini stroke [facility])    Per [facility] New Patient Packet   . Other hyperlipidemia   . Personal history of malignant neoplasm of other organs and systems     Past Surgical History:  Procedure Laterality Date  . CATARACT EXTRACTION    . DENTAL SURGERY    . SQUAMOUS CELL CARCINOMA EXCISION    . TONSILLECTOMY  1946   Per [place] New Patient Packet      reports that she quit smoking about 51 years ago. Her smoking use included cigarettes. She has a 15.00 pack-year smoking history. She has never used smokeless tobacco. She reports current alcohol use. She  reports that she does not use drugs.  No Known Allergies  Family History  Problem Relation Age of Onset  . Dementia Mother 4  . Breast cancer Mother   . Pneumonia Father 49  . Cancer Father   . Stroke Neg Hx     Prior to Admission medications   Medication Sig Start Date End Date Taking? Authorizing Provider  atorvastatin (LIPITOR) 80 MG tablet Take 1 tablet (80 mg total) by mouth daily at 6 PM. 03/11/20   Dewaine Oats, Carlos American, NP  calcium  carbonate (OSCAL) 1500 (600 Ca) MG TABS tablet Take 1 tablet by mouth daily at 12 noon. 10/16/16   [provider]  clopidogrel (PLAVIX) 75 MG tablet Take 1 tablet (75 mg total) by mouth daily. 03/11/20   Lauree Chandler, NP  lisinopril (ZESTRIL) 20 MG tablet Take 1 tablet (20 mg total) by mouth daily. 03/11/20   Lauree Chandler, NP  UNABLE TO FIND CBD Recovery Cream 300mg  (2oz/71mL) use twice weekly for had pain    [provider]    Physical Exam:  Constitutional: Elderly female currently in NAD, calm, comfortable Vitals:   04/15/20 1521 04/15/20 1601 04/15/20 1630 04/15/20 1638  BP: (!) 154/61 (!) 162/130 (!) 135/92   Pulse: 80 80 81   Resp: 15 (!) 22 (!) 21   Temp:      TempSrc:      SpO2: 99% 99% 100% 100%   Eyes: PERRL, lids and conjunctivae normal ENMT: Mucous membranes are moist. Posterior pharynx clear of any exudate or lesions. Hearing aid present in right ear Neck: normal, supple, no masses, no thyromegaly Respiratory: clear to auscultation bilaterally, no wheezing, no crackles. Normal respiratory effort. No accessory muscle use.  Cardiovascular: Regular rate and rhythm, no murmurs / rubs / gallops. No extremity edema. 2+ pedal pulses. No carotid bruits.  Abdomen: no tenderness, no masses palpated. No hepatosplenomegaly. Bowel sounds positive.  Musculoskeletal: no clubbing / cyanosis.  Thoracic kyphosis. Skin: no rashes, lesions, ulcers. No induration Neurologic: CN 2-12 grossly intact. Sensation intact, DTR normal. Strength 5/5 in all 4.  Psychiatric: Normal judgment and insight. Alert and oriented x 3. Normal mood.     Labs on Admission: I have personally reviewed following labs and imaging studies  CBC: Recent Labs  Lab 04/15/20 1351 04/15/20 1437  WBC 12.1*  --   NEUTROABS 9.3*  --   HGB 9.0* 9.9*  HCT 30.2* 29.0*  MCV 106.3*  --   PLT 166  --    Basic Metabolic Panel: Recent Labs  Lab 04/15/20 1351 04/15/20 1437  NA 130* 132*   K 4.8 4.8  CL 96* 98  CO2 23  --   GLUCOSE 117* 109*  BUN 27* 31*  CREATININE 1.02* 1.00  CALCIUM 9.3  --    GFR: Estimated Creatinine Clearance: 26.5 mL/min (by C-G formula based on SCr of 1 mg/dL). Liver Function Tests: Recent Labs  Lab 04/15/20 1351  AST 30  ALT 34  ALKPHOS 77  BILITOT 0.9  PROT 6.6  ALBUMIN 4.1   No results for input(s): LIPASE, AMYLASE in the last 168 hours. No results for input(s): AMMONIA in the last 168 hours. Coagulation Profile: Recent Labs  Lab 04/15/20 1351  INR 1.2   Cardiac Enzymes: No results for input(s): CKTOTAL, CKMB, CKMBINDEX, TROPONINI in the last 168 hours. BNP (last 3 results) No results for input(s): PROBNP in the last 8760 hours. HbA1C: No results for input(s): HGBA1C in the last 72  hours. CBG: Recent Labs  Lab 04/15/20 1352  GLUCAP 111*   Lipid Profile: No results for input(s): CHOL, HDL, LDLCALC, TRIG, CHOLHDL, LDLDIRECT in the last 72 hours. Thyroid Function Tests: No results for input(s): TSH, T4TOTAL, FREET4, T3FREE, THYROIDAB in the last 72 hours. Anemia Panel: No results for input(s): VITAMINB12, FOLATE, FERRITIN, TIBC, IRON, RETICCTPCT in the last 72 hours. Urine analysis:    Component Value Date/Time   COLORURINE YELLOW 09/28/2017 1221   APPEARANCEUR HAZY (A) 09/28/2017 1221   LABSPEC 1.006 09/28/2017 1221   PHURINE 7.0 09/28/2017 1221   GLUCOSEU NEGATIVE 09/28/2017 1221   HGBUR NEGATIVE 09/28/2017 1221   BILIRUBINUR NEGATIVE 09/28/2017 1221   KETONESUR NEGATIVE 09/28/2017 1221   PROTEINUR NEGATIVE 09/28/2017 1221   NITRITE NEGATIVE 09/28/2017 1221   LEUKOCYTESUR NEGATIVE 09/28/2017 1221   Sepsis Labs: No results found for this or any previous visit (from the past 240 hour(s)).   Radiological Exams on Admission: CT Angio Head W or Wo Contrast  Result Date: 04/15/2020 CLINICAL DATA:  TIA.  Slurred speech and confusion. EXAM: CT ANGIOGRAPHY HEAD AND NECK TECHNIQUE: Multidetector CT imaging of the  head and neck was performed using the standard protocol during bolus administration of intravenous contrast. Multiplanar CT image reconstructions and MIPs were obtained to evaluate the vascular anatomy. Carotid stenosis measurements (when applicable) are obtained utilizing NASCET criteria, using the distal internal carotid diameter as the denominator. CONTRAST:  59mL OMNIPAQUE IOHEXOL 350 MG/ML SOLN COMPARISON:  Head CT earlier same day.  MRI 09/28/2017. FINDINGS: CTA NECK FINDINGS Aortic arch: Aortic atherosclerotic calcification. Brachiocephalic vessel origins are not included in the exam. There is at least atherosclerotic plaque at the origins. Right carotid system: Common carotid artery shows plaque but is widely patent to the bifurcation region. Mild soft and calcified plaque at the carotid bifurcation and ICA bulb but no stenosis. Cervical ICA widely patent. Left carotid system: Common carotid artery shows plaque but is widely patent to the bifurcation region. Mild soft and calcified plaque at the carotid bifurcation and ICA bulb but no stenosis. Cervical ICA widely patent. Vertebral arteries: Both vertebral artery origins are patent. 30-50% stenosis on both sides. Beyond that, both vessels are widely patent through the cervical region to the foramen magnum. Skeleton: Advanced cervical spondylosis with disc space narrowing. No severe canal stenosis. Multilevel foraminal narrowing. Other neck: No mass or lymphadenopathy. Upper chest: Areas of pulmonary scarring. 7 mm ground-glass opacity in the right upper lobe. Review of the MIP images confirms the above findings CTA HEAD FINDINGS Anterior circulation: Both internal carotid arteries are patent through the skull base and siphon regions. The anterior and middle cerebral vessels are patent. No large or medium vessel occlusion. No correctable proximal stenosis. No aneurysm. Posterior circulation: Both vertebral arteries are patent through the foramen magnum. Both  V4 segments show atherosclerotic narrowing but are patent to the basilar. No focal basilar stenosis. Superior cerebellar and posterior cerebral arteries are patent. There are patent bilateral posterior communicating arteries. Venous sinuses: Patent and normal. Anatomic variants: None significant. Review of the MIP images confirms the above findings IMPRESSION: 1. No intracranial large or medium vessel occlusion. 2. Atherosclerotic disease at both carotid bifurcations but without stenosis. 3. 30-50% stenoses at both vertebral artery origins. 30-50% stenosis of both vertebral artery V4 segments. 4. 7 mm ground-glass opacity in the right upper lobe. Emphysema and aortic atherosclerosis. Initial follow-up with CT at 6-12 months is recommended to confirm persistence. If persistent, repeat CT is recommended every 2 years  until 5 years of stability has been established. This recommendation follows the consensus statement: Guidelines for Management of Incidental Pulmonary Nodules Detected on CT Images: From the Fleischner Society 2017; Radiology 2017; 284:228-243. Aortic Atherosclerosis (ICD10-I70.0) and Emphysema (ICD10-J43.9). Electronically Signed   By: Nelson Chimes M.D.   On: 04/15/2020 17:34   CT HEAD WO CONTRAST  Result Date: 04/15/2020 CLINICAL DATA:  TIA. Possible seizure last night. Episode of slurred speech and confusion this morning, now resolved. EXAM: CT HEAD WITHOUT CONTRAST TECHNIQUE: Contiguous axial images were obtained from the base of the skull through the vertex without intravenous contrast. COMPARISON:  Head CT and MRI 09/28/2017 FINDINGS: Brain: There is no evidence of an acute infarct, intracranial hemorrhage, mass, midline shift, or extra-axial fluid collection. Patchy hypodensities in the cerebral white matter bilaterally are unchanged and nonspecific but compatible with moderate chronic small vessel ischemic disease. There is mild-to-moderate cerebral atrophy. Vascular: Calcified  atherosclerosis at the skull base. No hyperdense vessel. Skull: No fracture or suspicious osseous lesion. Sinuses/Orbits: Visualized paranasal sinuses and mastoid air cells are clear. Bilateral cataract extraction. Other: None. IMPRESSION: 1. No evidence of acute intracranial abnormality. 2. Moderate chronic small vessel ischemic disease. Electronically Signed   By: Logan Bores M.D.   On: 04/15/2020 14:28   CT Angio Neck W and/or Wo Contrast  Result Date: 04/15/2020 CLINICAL DATA:  TIA.  Slurred speech and confusion. EXAM: CT ANGIOGRAPHY HEAD AND NECK TECHNIQUE: Multidetector CT imaging of the head and neck was performed using the standard protocol during bolus administration of intravenous contrast. Multiplanar CT image reconstructions and MIPs were obtained to evaluate the vascular anatomy. Carotid stenosis measurements (when applicable) are obtained utilizing NASCET criteria, using the distal internal carotid diameter as the denominator. CONTRAST:  20mL OMNIPAQUE IOHEXOL 350 MG/ML SOLN COMPARISON:  Head CT earlier same day.  MRI 09/28/2017. FINDINGS: CTA NECK FINDINGS Aortic arch: Aortic atherosclerotic calcification. Brachiocephalic vessel origins are not included in the exam. There is at least atherosclerotic plaque at the origins. Right carotid system: Common carotid artery shows plaque but is widely patent to the bifurcation region. Mild soft and calcified plaque at the carotid bifurcation and ICA bulb but no stenosis. Cervical ICA widely patent. Left carotid system: Common carotid artery shows plaque but is widely patent to the bifurcation region. Mild soft and calcified plaque at the carotid bifurcation and ICA bulb but no stenosis. Cervical ICA widely patent. Vertebral arteries: Both vertebral artery origins are patent. 30-50% stenosis on both sides. Beyond that, both vessels are widely patent through the cervical region to the foramen magnum. Skeleton: Advanced cervical spondylosis with disc space  narrowing. No severe canal stenosis. Multilevel foraminal narrowing. Other neck: No mass or lymphadenopathy. Upper chest: Areas of pulmonary scarring. 7 mm ground-glass opacity in the right upper lobe. Review of the MIP images confirms the above findings CTA HEAD FINDINGS Anterior circulation: Both internal carotid arteries are patent through the skull base and siphon regions. The anterior and middle cerebral vessels are patent. No large or medium vessel occlusion. No correctable proximal stenosis. No aneurysm. Posterior circulation: Both vertebral arteries are patent through the foramen magnum. Both V4 segments show atherosclerotic narrowing but are patent to the basilar. No focal basilar stenosis. Superior cerebellar and posterior cerebral arteries are patent. There are patent bilateral posterior communicating arteries. Venous sinuses: Patent and normal. Anatomic variants: None significant. Review of the MIP images confirms the above findings IMPRESSION: 1. No intracranial large or medium vessel occlusion. 2. Atherosclerotic disease at both  carotid bifurcations but without stenosis. 3. 30-50% stenoses at both vertebral artery origins. 30-50% stenosis of both vertebral artery V4 segments. 4. 7 mm ground-glass opacity in the right upper lobe. Emphysema and aortic atherosclerosis. Initial follow-up with CT at 6-12 months is recommended to confirm persistence. If persistent, repeat CT is recommended every 2 years until 5 years of stability has been established. This recommendation follows the consensus statement: Guidelines for Management of Incidental Pulmonary Nodules Detected on CT Images: From the Fleischner Society 2017; Radiology 2017; 284:228-243. Aortic Atherosclerosis (ICD10-I70.0) and Emphysema (ICD10-J43.9). Electronically Signed   By: Nelson Chimes M.D.   On: 04/15/2020 17:34    EKG: Independently reviewed.  Sinus rhythm 86 bpm with PVCs.  Assessment/Plan TIA  history of TIA: Patient presents after  having a brief episode of aphasia today and a episode of hand shaking yesterday.  Patient currently back to baseline.  CTA imaging of the head and neck negative for any acute intracranial abnormality or large vessel obstruction. -Admit to telemetry bed -Stroke order set initiated -Neuro checks -Check  MRI (negative for any acute abnormalities) -PT/OT/Speech to evaluate and treat -Check echocardiogram -Check EEG -Check Hemoglobin A1c and lipid panel in a.m. -Continue Plavix and add on aspirin 81 mg daily x21 days per neurology recommendation -Follow-up telemetry overnight -Appreciate neurology consultative services, will follow-up for any further recommendation  Leukocytosis: Acute.  WBC elevated at 12.1.  Chest x-ray was otherwise clear and patient has been afebrile. -Check urinalysis -Check CBC in a.m.   Macrocytic anemia: Acute on chronic.  Hemoglobin 9 which appears near patient's baseline with MCV 106.3 -Check vitamin B12 and folate  Hyponatremia: Acute.  On admission sodium was noted to be 132 and patient had elevated BUN to creatinine ratio is just a hypovolemic hyponatremia. -Normal saline IV fluids at 75 mL/h overnight -Recheck BMP in a.m.  Essential hypertension: Blood pressure currently 136/67.  Home blood pressure medications include lisinopril 20 mg daily. -Continue lisinopril  Hyperlipidemia: Home medication regimen includes atorvastatin 80 mg daily -Continue statin  Chronic kidney disease stage IIIa: Stable  DVT prophylaxis: Lovenox Code Status: Full Family Communication: Daughter updated Disposition Plan: Likely discharge home once medically stable Consults called: Neurology Admission status: Observation  Norval Morton MD [facility]   If 7PM-7AM, please contact night-coverage   04/15/2020, 5:45 PM

## 2020-04-16 ENCOUNTER — Observation Stay (HOSPITAL_BASED_OUTPATIENT_CLINIC_OR_DEPARTMENT_OTHER): Payer: Medicare HMO

## 2020-04-16 ENCOUNTER — Observation Stay (HOSPITAL_COMMUNITY): Payer: Medicare HMO

## 2020-04-16 DIAGNOSIS — G459 Transient cerebral ischemic attack, unspecified: Secondary | ICD-10-CM

## 2020-04-16 DIAGNOSIS — I361 Nonrheumatic tricuspid (valve) insufficiency: Secondary | ICD-10-CM

## 2020-04-16 DIAGNOSIS — I340 Nonrheumatic mitral (valve) insufficiency: Secondary | ICD-10-CM | POA: Diagnosis not present

## 2020-04-16 LAB — URINALYSIS, ROUTINE W REFLEX MICROSCOPIC
Bilirubin Urine: NEGATIVE
Glucose, UA: NEGATIVE mg/dL
Hgb urine dipstick: NEGATIVE
Ketones, ur: NEGATIVE mg/dL
Leukocytes,Ua: NEGATIVE
Nitrite: NEGATIVE
Protein, ur: NEGATIVE mg/dL
Specific Gravity, Urine: 1.028 (ref 1.005–1.030)
pH: 7 (ref 5.0–8.0)

## 2020-04-16 LAB — HEMOGLOBIN A1C
Hgb A1c MFr Bld: 5.5 % (ref 4.8–5.6)
Mean Plasma Glucose: 111.15 mg/dL

## 2020-04-16 LAB — CBC
HCT: 26.1 % — ABNORMAL LOW (ref 36.0–46.0)
Hemoglobin: 8 g/dL — ABNORMAL LOW (ref 12.0–15.0)
MCH: 32.3 pg (ref 26.0–34.0)
MCHC: 30.7 g/dL (ref 30.0–36.0)
MCV: 105.2 fL — ABNORMAL HIGH (ref 80.0–100.0)
Platelets: 148 10*3/uL — ABNORMAL LOW (ref 150–400)
RBC: 2.48 MIL/uL — ABNORMAL LOW (ref 3.87–5.11)
RDW: 21.9 % — ABNORMAL HIGH (ref 11.5–15.5)
WBC: 9.8 10*3/uL (ref 4.0–10.5)
nRBC: 0.5 % — ABNORMAL HIGH (ref 0.0–0.2)

## 2020-04-16 LAB — LIPID PANEL
Cholesterol: 105 mg/dL (ref 0–200)
HDL: 49 mg/dL (ref >40)
LDL Cholesterol: 43 mg/dL (ref 0–99)
Total CHOL/HDL Ratio: 2.1 RATIO
Triglycerides: 66 mg/dL (ref <150)
VLDL: 13 mg/dL (ref 0–40)

## 2020-04-16 LAB — ECHOCARDIOGRAM COMPLETE
AV Mean grad: 5 mmHg
AV Peak grad: 10.3 mmHg
Ao pk vel: 1.61 m/s
Area-P 1/2: 5.46 cm2
Height: 59 in
S' Lateral: 1.9 cm
Weight: 1625.6 oz

## 2020-04-16 LAB — BASIC METABOLIC PANEL
Anion gap: 8 (ref 5–15)
BUN: 28 mg/dL — ABNORMAL HIGH (ref 8–23)
CO2: 24 mmol/L (ref 22–32)
Calcium: 8.9 mg/dL (ref 8.9–10.3)
Chloride: 100 mmol/L (ref 98–111)
Creatinine, Ser: 1.22 mg/dL — ABNORMAL HIGH (ref 0.44–1.00)
GFR, Estimated: 43 mL/min — ABNORMAL LOW (ref >60)
Glucose, Bld: 111 mg/dL — ABNORMAL HIGH (ref 70–99)
Potassium: 4.3 mmol/L (ref 3.5–5.1)
Sodium: 132 mmol/L — ABNORMAL LOW (ref 135–145)

## 2020-04-16 LAB — FOLATE: Folate: 9.9 ng/mL (ref >5.9)

## 2020-04-16 LAB — VITAMIN B12: Vitamin B-12: 317 pg/mL (ref 180–914)

## 2020-04-16 MED ORDER — VITAMIN B-12 1000 MCG PO TABS: 1000.0000 ug | ORAL_TABLET | Freq: Every day | ORAL | Status: DC

## 2020-04-16 MED ORDER — ASPIRIN 81 MG PO TBEC: 81.0000 mg | DELAYED_RELEASE_TABLET | Freq: Every day | ORAL | 0 refills | Status: AC

## 2020-04-16 MED ORDER — VITAMIN B-12 1000 MCG PO TABS: 1000.0000 ug | ORAL_TABLET | Freq: Every day | ORAL | 0 refills | Status: DC

## 2020-04-16 NOTE — Progress Notes (Signed)
   04/16/20 1455  TOC ED Mini Assessment  TOC Time spent with patient (minutes): 30  TOC Time saved using PING (minutes): 20  PING Used in TOC Assessment Yes  Admission or Readmission Diverted Yes  Interventions which prevented an admission or readmission [place] or Services;DME Provided  What brought you to the Emergency Department?  confusion  Means of departure Car  Patient states their goals for this hospitalization and ongoing recovery are: to go home  Hildred Pharo J. Clydene Laming, RN, BSN, [place] [phone] RNCM spoke with pt at bedside regarding discharge planning for [place]. Offered pt medicare.gov list of home health agencies to choose from.  Pt chose [facility] to render services. Tommi Rumps of [facility] notified. Patient made aware that [facility] will be in contact in 24-48 hours.  DME needs identified at this time include 3n1.  3n1 order through [facility].  3n1 will be delivered to pt room prior to discharge home today.

## 2020-04-16 NOTE — Progress Notes (Signed)
Subjective:  No acute events overnight. Neurological exam stable from yesterday with some improvement in comprehension. Patient sitting up in bed, just ate a meal. Patient and patient's daughter at bedside were updated on results and plan with outpatient follow-up recommendations.    Exam: Vitals:   04/16/20 1100 04/16/20 1225  BP: 135/61 (!) 128/58  Pulse: 75 87  Resp: 19 (!) 25  Temp:  98 F (36.7 C)  SpO2: 100% 100%   Gen: Sitting up in bed with meal at bedside table, patient in no acute distress Resp: non-labored breathing, symmetric chest rise with inspiration, on room air Abd: soft, non-tender  Neuro: Mental Status: Patient is awake, alert, oriented to person, place, age, month, year, and situation. She is able to give a clear and coherent history. There are no signs of aphasia or neglect. Speech is clear without aphasia or dysarthria. Follows all commands. Comprehension intact, able to say the months of the year backward without complication, improvement from yesterday (slow at times and needs to be prompted with the "what's the last month of the year" Cranial Nerves: II, III,IV, VI: : Tracks examiner in all directions V: Facial sensation is symmetric to light touch  VII: Facial movement is symmetric resting and smiling VIII: Hearing is intact to voice; hard of hearing at baseline with hearingaids in place X: Phonation intact  Motor: 5/5 strength was present in all four extremities. Antigravity movement in all extremities without pronator drift.  Sensory:   Pertinent Labs: CBC    Component Value Date/Time   WBC 9.8 04/16/2020 0206   RBC 2.48 (L) 04/16/2020 0206   HGB 8.0 (L) 04/16/2020 0206   HCT 26.1 (L) 04/16/2020 0206   PLT 148 (L) 04/16/2020 0206   MCV 105.2 (H) 04/16/2020 0206   MCH 32.3 04/16/2020 0206   MCHC 30.7 04/16/2020 0206   RDW 21.9 (H) 04/16/2020 0206   LYMPHSABS 1.4 04/15/2020 1351   MONOABS 0.9 04/15/2020 1351   EOSABS 0.3 04/15/2020 1351    BASOSABS 0.1 04/15/2020 1351   CMP     Component Value Date/Time   NA 132 (L) 04/16/2020 0206   NA 132 (A) 06/10/2019 0000   K 4.3 04/16/2020 0206   CL 100 04/16/2020 0206   CO2 24 04/16/2020 0206   GLUCOSE 111 (H) 04/16/2020 0206   BUN 28 (H) 04/16/2020 0206   BUN 20 06/10/2019 0000   CREATININE 1.22 (H) 04/16/2020 0206   CREATININE 0.97 (H) 03/04/2020 1211   CALCIUM 8.9 04/16/2020 0206   PROT 6.6 04/15/2020 1351   ALBUMIN 4.1 04/15/2020 1351   AST 30 04/15/2020 1351   ALT 34 04/15/2020 1351   ALKPHOS 77 04/15/2020 1351   BILITOT 0.9 04/15/2020 1351   GFRNONAA 43 (L) 04/16/2020 0206   GFRNONAA 53 (L) 03/04/2020 1211   GFRAA 61 03/04/2020 1211   Lipid Panel     Component Value Date/Time   CHOL 105 04/16/2020 0446   TRIG 66 04/16/2020 0446   HDL 49 04/16/2020 0446   CHOLHDL 2.1 04/16/2020 0446   VLDL 13 04/16/2020 0446   LDLCALC 43 04/16/2020 0446   LDLCALC 44 02/22/2019 0920   Hemoglobin A1c 5.5%  Lab Results  Component Value Date   VITAMINB12 317 04/16/2020    Echocardiogram: with mild left atrial dilation, LVEF 70-75%  CT head IMPRESSION: 1. No evidence of acute intracranial abnormality. 2. Moderate chronic small vessel ischemic disease.  MRI brain IMPRESSION: 1. No acute intracranial finding. Advanced chronic small-vessel ischemic changes throughout  the brain as outlined above. 2. Low signal of the marrow spaces of the skull base and cervical spine. Whereas this could simply be due to increased hematopoietic elements, the appearance is somewhat patchy and therefore concerning for osseous metastatic disease. No fracture or extraosseous extension.  EEG 1/25: Description: The posterior dominant rhythm consists of 7.5 Hz activity of moderate voltage (25-35 uV) seen predominantly in posterior head regions, symmetric and reactive to eye opening and eye closing. EEG showed intermittent generalized 3 to 6 Hz theta-delta slowing. Physiologic photic driving was seen  during photic stimulation.  Hyperventilation was not performed.    ABNORMALITY -Intermittent slow, generalized  IMPRESSION:  This study is suggestive of mild diffuse encephalopathy, nonspecific etiology. No seizures or epileptiform  discharges were seen throughout the recording.  Impression: 87 year old female with multiple stroke risk factors including previous TIA on home clopidogrel, hypertension, and hyperlipidemia presented 04/15/20 from PCP office for a 15-20 minute episode of transient aphasia PTA with subsequent confusion. Also, of note was an episode of witnessed rhythmic bilateral hand shaking last night at her daughter's house without history of such incident.  While patient has reported that she remembers the event, she does not give a very detailed history of such spontaneously, and her daughter notes that she minimizes her symptoms.  Thus it is very unclear whether this may have represented seizure or not.  CT with evidence of moderate chronic small vessel disease and atherosclerotic cerebrovascular disease. MRI brain with no acute intracranial process. EEG: "This study is suggestive of mild diffuse encephalopathy, nonspecific etiology. No seizures or epileptiform discharges were seen throughout the recording."   -Etiology likely TIA/ischemia from atherosclerotic or embolic source, though also potentially concerning for seizures given 3 similar presentations in the past 2.5 years. Routine EEG obtained without evidence of seizures inpatient. May benefit from longer monitoring outpatient with persistent, progressive, or recurring home episodes of shaking.  - DDx may also include infectious/metabolic etiology of confusion with mild leukocytosis and mild hyponatremia to 130 but less likely given patient afebrile with resolution of symptoms.    Recommendations: 1. Continue previous home clopidogrel 2. Add aspirin 81 mg PO daily for 3 weeks and then resume home clopidogrel monotherapy 3.  Outpatient follow-up with neurology for mild memory complaints 4. Recommend 30-day event monitor with echocardiogram revealing left atrial enlargement 5. 1000 mcg B12 daily for low normal B12 6. Neurology without further recommendations for inpatient work-up. Our team will be available for further questions or concerns.   Anibal Henderson, AGACNP-BC [facility] [phone]  Attending Neurologist's note:  I personally saw this patient, gathering history, performing a full neurologic examination, reviewing relevant labs, personally reviewing relevant imaging including MRI brain, and formulated the assessment and plan, adding the note above for completeness and clarity to accurately reflect my thoughts

## 2020-04-16 NOTE — Progress Notes (Signed)
Pt was seen for mobility on RW and practice with BSC, with instructions for safety and discussion at length with daughter about pt needs for therapy and her need for help with pt at home.  Follow along with pt if dc is not today, reinforcing safety and control of the walker.  Pt has a RW at home but requested Rollator for ability to sit her down and to gradually increase her control of mobility.  Pt will be followed as her stay permits, HHPT continue on for motor planning deficits, for balance and strengthening, safety education.  04/16/20 1700  PT Visit Information  Last PT Received On 04/16/20  Assistance Needed +1  History of Present Illness 87 y.o. female with medical history significant of hypertension, hyperlipidemia, TIAs, macrocytic anemia, history of melanoma shoulder presents after having an episode while at her PCP office today of being unable to speak. Patient had had 2 prior TIAs in the last 2 years for  which she has had difficulty speaking before. MRI - for acute abnormalities.EEG suggestive of mild diffuse encephalopathy.  Precautions  Precautions Fall  Precaution Comments monitor for "giving out" on R knee  Restrictions  Weight Bearing Restrictions No  Home Living  Family/patient expects to be discharged to: Private residence  Living Arrangements Alone;Children  Available Help at Discharge Available 24 hours/day  Type of Home Independent living facility  Home Access Level entry  [place] One level  Bathroom Shower/Tub Walk-in shower  Bathroom Toilet Handicapped height  Bathroom Accessibility Yes  Home Equipment Shower seat - built in;Grab bars - toilet;Grab bars - tub/shower;Hand held shower head  Additional Comments has been fully independent at home prior to this incident  Prior Function  Level of Independence Independent with assistive device(s)  Comments using a cane  Communication  Communication HOH  Pain Assessment  Pain Assessment Faces  Faces Pain Scale 0   Cognition  Arousal/Alertness Awake/alert  Behavior During Therapy WFL for tasks assessed/performed  Overall Cognitive Status Impaired/Different from baseline  Area of Impairment Problem solving;Awareness;Safety/judgement;Following commands;Orientation  Orientation Level Situation  Current Attention Level Selective  Memory Decreased short-term memory  Following Commands Follows one step commands inconsistently;Follows one step commands with increased time  Safety/Judgement Decreased awareness of safety;Decreased awareness of deficits  Awareness Emergent;Intellectual  Problem Solving Slow processing;Requires verbal cues;Requires tactile cues  General Comments requires repetitive instructions for mobility on RW, very limited ability to follow through on turning and awareness of direction  Upper Extremity Assessment  Upper Extremity Assessment Generalized weakness  Lower Extremity Assessment  Lower Extremity Assessment Generalized weakness  Cervical / Trunk Assessment  Cervical / Trunk Assessment Kyphotic  Bed Mobility  Overal bed mobility Needs Assistance  Bed Mobility Supine to Sit;Sit to Supine  Supine to sit Min assist  Sit to supine Min assist  Transfers  Overall transfer level Needs assistance  Equipment used 1 person hand held assist  Transfers Sit to/from Stand  Sit to Stand Min assist  Ambulation/Gait  Ambulation/Gait assistance Min assist  Gait Distance (Feet) 30 Feet  Assistive device Rolling walker (2 wheeled);1 person hand held assist  Gait Pattern/deviations Step-through pattern;Step-to pattern;Decreased stride length;Wide base of support  General Gait Details pt struggles to direct walker which is a little large for her but cannot stay within the borders and cannot turn without help  Gait velocity reduced  Balance  Overall balance assessment Needs assistance  Sitting-balance support Feet supported  Sitting balance-Leahy Scale Good  Standing balance support  Bilateral upper  extremity supported;During functional activity  Standing balance-Leahy Scale Poor  General Comments  General comments (skin integrity, edema, etc.) daughter is concerned about pt being manageable at home, and discussed the ease of getting her on and off BSC, as well as just needing to use a hand to help with walker.  Agreed to rollator to move her due to safety of brakes and being able to sit down.  Pt is mild instability on R knee but did not give out with gait.  Gave daughter a gait belt for stability  Exercises  Exercises Other exercises (LE strength is 4- generally)  PT - End of Session  Activity Tolerance Patient limited by fatigue  Patient left in bed;with call bell/phone within reach;with family/visitor present  Nurse Communication Mobility status  PT Assessment  PT Recommendation/Assessment Patient needs continued PT services  PT Visit Diagnosis Unsteadiness on feet (R26.81);Muscle weakness (generalized) (M62.81)  PT Problem List Decreased strength;Decreased activity tolerance;Decreased balance;Decreased coordination;Decreased skin integrity  Barriers to Discharge Inaccessible home environment;Decreased caregiver support  Barriers to Discharge Comments has some stairs to upper house eventually to navigate  PT Plan  PT Frequency (ACUTE ONLY) Min 3X/week  PT Treatment/Interventions (ACUTE ONLY) DME instruction;Gait training;Stair training;Functional mobility training;Therapeutic activities;Therapeutic exercise;Balance training;Neuromuscular re-education;Patient/family education  AM-PAC PT "6 Clicks" Mobility Outcome Measure (Version 2)  Help needed turning from your back to your side while in a flat bed without using bedrails? 3  Help needed moving from lying on your back to sitting on the side of a flat bed without using bedrails? 3  Help needed moving to and from a bed to a chair (including a wheelchair)? 3  Help needed standing up from a chair using your arms (e.g.,  wheelchair or bedside chair)? 3  Help needed to walk in hospital room? 3  Help needed climbing 3-5 steps with a railing?  1  6 Click Score 16  Consider Recommendation of Discharge To: Home with [facility]  PT Recommendation  Follow Up Recommendations Home health PT;Supervision for mobility/OOB;Supervision/Assistance - 24 hour  PT equipment  (Rollator)  Individuals Consulted  Consulted and Agree with Results and Recommendations Patient;Family member/caregiver  Family Member Consulted daughter  Acute Rehab PT Goals  Patient Stated Goal to get home and be independent  PT Goal Formulation With patient/family  Time For Goal Achievement 04/30/20  Potential to Achieve Goals Good  PT Time Calculation  PT Start Time (ACUTE ONLY) 1530  PT Stop Time (ACUTE ONLY) 1634  PT Time Calculation (min) (ACUTE ONLY) 64 min  PT General Charges  $$ ACUTE PT VISIT 1 Visit  PT Evaluation  $PT Eval Moderate Complexity 1 Mod  PT Treatments  $Gait Training 8-22 mins  $Therapeutic Activity 23-37 mins  Written Expression  Dominant Hand Right    Mee Hives, PT MS Acute Rehab Dept. Number: [place]

## 2020-04-16 NOTE — Progress Notes (Signed)
Occupational Therapy Evaluation Patient Details Name: Sarah Hampton MRN: [id number] DOB: 1930-08-13 Today's Date: 04/16/2020    History of Present Illness 86 y.o. female with medical history significant of hypertension, hyperlipidemia, TIAs, macrocytic anemia, history of melanoma shoulder presents after having an episode while at her PCP office today of being unable to speak. Patient had had 2 prior TIAs in the last 2 years for  which she has had difficulty speaking before. MRI - for acute abnormalities.EEG suggestive of mild diffuse encephalopathy.   Clinical Impression   PTA pt lives independently at [facility] and is independent with mobility and ADL tasks. Pt enjoys being active and is involved in activities at the complex. Pt unsteady with mobility and ADL tasks and will need direct physical assistance with mobility and ADL with use of RW to reduce risk of falls. Pt scored a 4 on the Short Blessed Test of Memory and Concentration which places her in the "normal" category. However recommend direct S with medication and financial management and follow up with [facility] [place] [facility] if available. Recommend Fall Alert Sytem. Daughter/pt verbalized understanding. Daughter states plan is for pt to return to her house initially until able to return to the [facility]. VSS during session. Discussed DC recommendations with CM and PT. Will follow acutely.     Follow Up Recommendations  Home health OT;Supervision/Assistance - 24 hour    Equipment Recommendations  3 in 1 bedside commode;Other (comment) (RW)    Recommendations for Other Services PT consult     Precautions / Restrictions Precautions Precautions: Fall      Mobility Bed Mobility Overal bed mobility: Needs Assistance Bed Mobility: Supine to Sit     Supine to sit: Min assist          Transfers Overall transfer level: Needs assistance Equipment used: 1 person hand held assist Transfers: Sit to/from  Stand Sit to Stand: Min assist         General transfer comment: Initially unsteady; pt reaching appropriately for support; not posteriro LOB at times    Balance Overall balance assessment: Needs assistance   Sitting balance-Leahy Scale: Good       Standing balance-Leahy Scale: Poor                             ADL either performed or assessed with clinical judgement   ADL Overall ADL's : Needs assistance/impaired                                     Functional mobility during ADLs: Minimal assistance General ADL Comments: Pt requires min A with LB ADL and mobility for ADL at this time     Vision Baseline Vision/History: Wears glasses Wears Glasses: Reading only       Perception     Praxis      Pertinent Vitals/Pain Pain Assessment: Faces Faces Pain Scale: Hurts a little bit Pain Location: back Pain Descriptors / Indicators: Aching;Discomfort Pain Intervention(s): Limited activity within patient's tolerance     Hand Dominance Right   Extremity/Trunk Assessment Upper Extremity Assessment Upper Extremity Assessment: Generalized weakness   Lower Extremity Assessment Lower Extremity Assessment: Defer to PT evaluation   Cervical / Trunk Assessment Cervical / Trunk Assessment: Kyphotic   Communication Communication Communication: HOH   Cognition Arousal/Alertness: Awake/alert Behavior During Therapy: WFL for tasks assessed/performed Overall Cognitive  Status: Impaired/Different from baseline Area of Impairment: Attention;Memory;Awareness                   Current Attention Level: Selective Memory: Decreased short-term memory     Awareness: Emergent   General Comments: Daughter thinks her cognition is close to baseline. Assessed with the Short Blessed Test of Memory and concentration. Pt scored a 4 which is within the "noraml" range (0-4).   General Comments  discussion with daughter regardindg home safety adn reducing  risk of falls. Recommend fall alert system in addition to direct S initially with medicaiton and financial managment. Daughter asking about pt going to [facility] for a trip this Friday - discussed need for HHOT/PT to facilitate return to [facility]    Exercises     Shoulder Instructions      Home Living Family/patient expects to be discharged to:: Private residence Living Arrangements: Alone;Children Available Help at Discharge: Available 24 hours/day Type of Home: Independent living facility Home Access: Level entry     Home Layout: One level     Bathroom Shower/Tub: Occupational psychologist: Handicapped height Bathroom Accessibility: Yes How Accessible: Accessible via wheelchair Home Equipment: Shower seat - built in;Grab bars - toilet;Grab bars - tub/shower;Hand held shower head          Prior Functioning/Environment Level of Independence: Independent        Comments: Recently started to try to use a cane        OT Problem List: Decreased strength;Decreased activity tolerance;Impaired balance (sitting and/or standing);Decreased safety awareness;Decreased knowledge of use of DME or AE;Decreased knowledge of precautions;Pain      OT Treatment/Interventions: Self-care/ADL training;Therapeutic exercise;Energy conservation;DME and/or AE instruction;Therapeutic activities;Cognitive remediation/compensation;Patient/family education;Balance training    OT Goals(Current goals can be found in the care plan section) Acute Rehab OT Goals Patient Stated Goal: to be as independent for as long as possible OT Goal Formulation: With patient/family Time For Goal Achievement: 04/30/20 Potential to Achieve Goals: Good  OT Frequency: Min 2X/week   Barriers to D/C:            Co-evaluation              AM-PAC OT "6 Clicks" Daily Activity     Outcome Measure Help from another person eating meals?: None Help from another person taking care of personal grooming?: A Little Help  from another person toileting, which includes using toliet, bedpan, or urinal?: A Little Help from another person bathing (including washing, rinsing, drying)?: A Little Help from another person to put on and taking off regular upper body clothing?: A Little Help from another person to put on and taking off regular lower body clothing?: A Little 6 Click Score: 19   End of Session Equipment Utilized During Treatment: Gait belt Nurse Communication: Mobility status;Other (comment) (DC needs)  Activity Tolerance: Patient tolerated treatment well Patient left: in bed;with call bell/phone within reach;with family/visitor present  OT Visit Diagnosis: Unsteadiness on feet (R26.81);Other abnormalities of gait and mobility (R26.89);Muscle weakness (generalized) (M62.81);Other symptoms and signs involving cognitive function;Pain Pain - part of body:  (back)                Time: 3762-8315 OT Time Calculation (min): 42 min Charges:  OT General Charges $OT Visit: 1 Visit OT Evaluation $OT Eval Moderate Complexity: 1 Mod OT Treatments $Self Care/Home Management : 8-22 mins  Maurie Boettcher, OT/L   Acute OT Clinical Specialist [place] Pager [phone] Office [phone]  Jahziel Sinn,HILLARY 04/16/2020, 1:19 PM

## 2020-04-16 NOTE — Discharge Summary (Signed)
Physician Discharge Summary  Sarah Hampton [id number] DOB: 26-Mar-1930 DOA: 04/15/2020  PCP: Lauree Chandler, NP  Admit date: 04/15/2020 Discharge date: 04/16/2020  Admitted From: Home Disposition: Home with home health services  Recommendations for Outpatient Follow-up:  1. Follow-up with PCP in 1 week 2. Repeat BMP on follow-up visit 3. Follow-up on pending result-methylmalonic acid 4. Follow-up with neurology as an outpatient 5. Continue B12 supplements p.o. daily 6. Aspirin and Plavix for 3 weeks and then Plavix alone   Home Health: Yes Equipment/Devices: Bedside commode Discharge Condition: Stable CODE STATUS: Full code Diet recommendation: Low-sodium diet  Brief/Interim Summary: Patient is a 87 year old female with past medical history of hypertension, hyperlipidemia, TIA, macrocytic anemia, history of melanoma presents to emergency department for the concern of transient aphasia and subsequent confusion.  Patient had 2 prior TIAs in the last 2 years for which she had difficulty speaking before.  She is currently on Plavix which she takes every day.  ED course: Upon arrival: Vitals within normal limit except blood pressure of 162/130.  CTA of head and neck showed no acute intracranial abnormality or large vessel obstruction.  WBC of 12.1, hemoglobin of 9.0, sodium 130, BUN 27, creatinine: 1.02, patient admitted for further stroke work-up.  TIA: -Work-up for stroke such as MRI brain, CTA head and neck: Negative for acute findings. -EEG negative for seizures.  Shows mild diffuse encephalopathy.  Patient is back to baseline and her symptoms resolved. -Echo showed ejection fraction of 70 to 75%-hyperdynamic function, left ventricular diastolic parameters are indeterminate. -A1c: 5.5%, LDL: 43, UA negative for infection, folate: WNL, B12: Low normal -Evaluated by neurology-okay to discharge from their standpoint on aspirin 81 mg daily and Plavix 75 mg daily for 3 weeks and then  Plavix alone.  Card master consulted for cardiac monitor to rule out A. fib.   -Continued atorvastatin.  Appreciate neurology's help. -OT recommended outpatient OT and bedside commode.  TOC consulted.  Home health arrangement done and bedside commode arranged for the discharge.  Hypertension: Blood pressure stable -Continued lisinopril  Hyperlipidemia: Continued atorvastatin  Macrocytic anemia: -H&H remained stable 8 and 9.  Folate: WNL, B12: Low normal -Started patient on B12 1000 mcg daily.  Methylmalonic acid level: Ordered by neurology and is pending  Thrombocytopenia: Platelet 148-note signs of active bleeding.  AKI on CKD stage IIIA: Mild.  Creatinine slightly trended up from 1.02-1.22.  Encouraged increase fluid intake.  Repeat BMP on follow-up visit.  Hyponatremia: Sodium 132. -Repeat BMP on follow-up visit  Leukocytosis: WBC 12.1.  Likely reactive.  Resolved.  Patient is stable for the discharge today.  Discussed plan of care with patient's daughter on the phone and she verbalized understanding.  Follow-up with neurology outpatient.  Discharge Diagnoses:  TIA Hypertension Hyperlipidemia Macrocytic anemia Thrombocytopenia AKI on CKD stage IIIa Hyponatremia Leukocytosis  Discharge Instructions  Discharge Instructions    Ambulatory referral to Neurology   Complete by: As directed    An appointment is requested in approximately 4-6 weeks for EEG with intermittent generalized slowing TIA versus seizure   Discharge instructions   Complete by: As directed    Follow-up with PCP in 1 week Repeat BMP on follow-up visit Follow-up on pending result-methylmalonic acid Follow-up with neurology as an outpatient Continue B12 supplements p.o. daily   Increase activity slowly   Complete by: As directed      Allergies as of 04/16/2020   No Known Allergies     Medication List    STOP taking these  medications   ibuprofen 200 MG tablet Commonly known as: ADVIL     TAKE  these medications   aspirin 81 MG EC tablet Take 1 tablet (81 mg total) by mouth daily for 21 days. Swallow whole. Start taking on: April 17, 2020   atorvastatin 80 MG tablet Commonly known as: LIPITOR Take 1 tablet (80 mg total) by mouth daily at 6 PM.   CALCIUM PO Take 1 tablet by mouth daily.   clopidogrel 75 MG tablet Commonly known as: PLAVIX Take 1 tablet (75 mg total) by mouth daily.   fluticasone 50 MCG/ACT nasal spray Commonly known as: FLONASE Place 1 spray into both nostrils daily as needed (seasonal allergies).   lisinopril 20 MG tablet Commonly known as: ZESTRIL Take 1 tablet (20 mg total) by mouth daily.   OVER THE COUNTER MEDICATION Apply 1 application topically daily as needed (back pain). Devil's club cream   OVER THE COUNTER MEDICATION Apply 1 application topically See admin instructions. CBD Recovery Cream 300mg  (2oz/28mL) use twice weekly as needed for back pain   vitamin B-12 1000 MCG tablet Commonly known as: CYANOCOBALAMIN Take 1 tablet (1,000 mcg total) by mouth daily.            Durable Medical Equipment  (From admission, onward)         Start     Ordered   04/16/20 1453  For home use only DME 3 n 1  Once        04/16/20 1454          No Known Allergies  Consultations:  Neurology   Procedures/Studies: CT Angio Head W or Wo Contrast  Result Date: 04/15/2020 CLINICAL DATA:  TIA.  Slurred speech and confusion. EXAM: CT ANGIOGRAPHY HEAD AND NECK TECHNIQUE: Multidetector CT imaging of the head and neck was performed using the standard protocol during bolus administration of intravenous contrast. Multiplanar CT image reconstructions and MIPs were obtained to evaluate the vascular anatomy. Carotid stenosis measurements (when applicable) are obtained utilizing NASCET criteria, using the distal internal carotid diameter as the denominator. CONTRAST:  46mL OMNIPAQUE IOHEXOL 350 MG/ML SOLN COMPARISON:  Head CT earlier same day.  MRI  09/28/2017. FINDINGS: CTA NECK FINDINGS Aortic arch: Aortic atherosclerotic calcification. Brachiocephalic vessel origins are not included in the exam. There is at least atherosclerotic plaque at the origins. Right carotid system: Common carotid artery shows plaque but is widely patent to the bifurcation region. Mild soft and calcified plaque at the carotid bifurcation and ICA bulb but no stenosis. Cervical ICA widely patent. Left carotid system: Common carotid artery shows plaque but is widely patent to the bifurcation region. Mild soft and calcified plaque at the carotid bifurcation and ICA bulb but no stenosis. Cervical ICA widely patent. Vertebral arteries: Both vertebral artery origins are patent. 30-50% stenosis on both sides. Beyond that, both vessels are widely patent through the cervical region to the foramen magnum. Skeleton: Advanced cervical spondylosis with disc space narrowing. No severe canal stenosis. Multilevel foraminal narrowing. Other neck: No mass or lymphadenopathy. Upper chest: Areas of pulmonary scarring. 7 mm ground-glass opacity in the right upper lobe. Review of the MIP images confirms the above findings CTA HEAD FINDINGS Anterior circulation: Both internal carotid arteries are patent through the skull base and siphon regions. The anterior and middle cerebral vessels are patent. No large or medium vessel occlusion. No correctable proximal stenosis. No aneurysm. Posterior circulation: Both vertebral arteries are patent through the foramen magnum. Both V4 segments show atherosclerotic narrowing  but are patent to the basilar. No focal basilar stenosis. Superior cerebellar and posterior cerebral arteries are patent. There are patent bilateral posterior communicating arteries. Venous sinuses: Patent and normal. Anatomic variants: None significant. Review of the MIP images confirms the above findings IMPRESSION: 1. No intracranial large or medium vessel occlusion. 2. Atherosclerotic disease at  both carotid bifurcations but without stenosis. 3. 30-50% stenoses at both vertebral artery origins. 30-50% stenosis of both vertebral artery V4 segments. 4. 7 mm ground-glass opacity in the right upper lobe. Emphysema and aortic atherosclerosis. Initial follow-up with CT at 6-12 months is recommended to confirm persistence. If persistent, repeat CT is recommended every 2 years until 5 years of stability has been established. This recommendation follows the consensus statement: Guidelines for Management of Incidental Pulmonary Nodules Detected on CT Images: From the Fleischner Society 2017; Radiology 2017; 284:228-243. Aortic Atherosclerosis (ICD10-I70.0) and Emphysema (ICD10-J43.9). Electronically Signed   By: Nelson Chimes M.D.   On: 04/15/2020 17:34   DG Chest 2 View  Result Date: 04/15/2020 CLINICAL DATA:  TIA EXAM: CHEST - 2 VIEW COMPARISON:  None. FINDINGS: Heart is normal size. Aortic calcifications. No confluent airspace opacities or effusions. No acute bony abnormality. IMPRESSION: No active cardiopulmonary disease. Electronically Signed   By: Rolm Baptise M.D.   On: 04/15/2020 19:00   CT HEAD WO CONTRAST  Result Date: 04/15/2020 CLINICAL DATA:  TIA. Possible seizure last night. Episode of slurred speech and confusion this morning, now resolved. EXAM: CT HEAD WITHOUT CONTRAST TECHNIQUE: Contiguous axial images were obtained from the base of the skull through the vertex without intravenous contrast. COMPARISON:  Head CT and MRI 09/28/2017 FINDINGS: Brain: There is no evidence of an acute infarct, intracranial hemorrhage, mass, midline shift, or extra-axial fluid collection. Patchy hypodensities in the cerebral white matter bilaterally are unchanged and nonspecific but compatible with moderate chronic small vessel ischemic disease. There is mild-to-moderate cerebral atrophy. Vascular: Calcified atherosclerosis at the skull base. No hyperdense vessel. Skull: No fracture or suspicious osseous lesion.  Sinuses/Orbits: Visualized paranasal sinuses and mastoid air cells are clear. Bilateral cataract extraction. Other: None. IMPRESSION: 1. No evidence of acute intracranial abnormality. 2. Moderate chronic small vessel ischemic disease. Electronically Signed   By: Logan Bores M.D.   On: 04/15/2020 14:28   CT Angio Neck W and/or Wo Contrast  Result Date: 04/15/2020 CLINICAL DATA:  TIA.  Slurred speech and confusion. EXAM: CT ANGIOGRAPHY HEAD AND NECK TECHNIQUE: Multidetector CT imaging of the head and neck was performed using the standard protocol during bolus administration of intravenous contrast. Multiplanar CT image reconstructions and MIPs were obtained to evaluate the vascular anatomy. Carotid stenosis measurements (when applicable) are obtained utilizing NASCET criteria, using the distal internal carotid diameter as the denominator. CONTRAST:  38mL OMNIPAQUE IOHEXOL 350 MG/ML SOLN COMPARISON:  Head CT earlier same day.  MRI 09/28/2017. FINDINGS: CTA NECK FINDINGS Aortic arch: Aortic atherosclerotic calcification. Brachiocephalic vessel origins are not included in the exam. There is at least atherosclerotic plaque at the origins. Right carotid system: Common carotid artery shows plaque but is widely patent to the bifurcation region. Mild soft and calcified plaque at the carotid bifurcation and ICA bulb but no stenosis. Cervical ICA widely patent. Left carotid system: Common carotid artery shows plaque but is widely patent to the bifurcation region. Mild soft and calcified plaque at the carotid bifurcation and ICA bulb but no stenosis. Cervical ICA widely patent. Vertebral arteries: Both vertebral artery origins are patent. 30-50% stenosis on both sides. Beyond  that, both vessels are widely patent through the cervical region to the foramen magnum. Skeleton: Advanced cervical spondylosis with disc space narrowing. No severe canal stenosis. Multilevel foraminal narrowing. Other neck: No mass or lymphadenopathy.  Upper chest: Areas of pulmonary scarring. 7 mm ground-glass opacity in the right upper lobe. Review of the MIP images confirms the above findings CTA HEAD FINDINGS Anterior circulation: Both internal carotid arteries are patent through the skull base and siphon regions. The anterior and middle cerebral vessels are patent. No large or medium vessel occlusion. No correctable proximal stenosis. No aneurysm. Posterior circulation: Both vertebral arteries are patent through the foramen magnum. Both V4 segments show atherosclerotic narrowing but are patent to the basilar. No focal basilar stenosis. Superior cerebellar and posterior cerebral arteries are patent. There are patent bilateral posterior communicating arteries. Venous sinuses: Patent and normal. Anatomic variants: None significant. Review of the MIP images confirms the above findings IMPRESSION: 1. No intracranial large or medium vessel occlusion. 2. Atherosclerotic disease at both carotid bifurcations but without stenosis. 3. 30-50% stenoses at both vertebral artery origins. 30-50% stenosis of both vertebral artery V4 segments. 4. 7 mm ground-glass opacity in the right upper lobe. Emphysema and aortic atherosclerosis. Initial follow-up with CT at 6-12 months is recommended to confirm persistence. If persistent, repeat CT is recommended every 2 years until 5 years of stability has been established. This recommendation follows the consensus statement: Guidelines for Management of Incidental Pulmonary Nodules Detected on CT Images: From the Fleischner Society 2017; Radiology 2017; 284:228-243. Aortic Atherosclerosis (ICD10-I70.0) and Emphysema (ICD10-J43.9). Electronically Signed   By: Nelson Chimes M.D.   On: 04/15/2020 17:34   MR BRAIN WO CONTRAST  Result Date: 04/15/2020 CLINICAL DATA:  Seizure like activity and speech disturbance. Confusion. EXAM: MRI HEAD WITHOUT CONTRAST TECHNIQUE: Multiplanar, multiecho pulse sequences of the brain and surrounding  structures were obtained without intravenous contrast. COMPARISON:  CT studies same day.  MRI 09/28/2017. FINDINGS: Brain: Diffusion imaging does not show any acute or subacute infarction. Mild chronic small-vessel ischemic changes affect the pons. No focal cerebellar insult. Cerebral hemispheres show pronounced chronic small-vessel disease of the deep and subcortical white matter. No cortical or large vessel territory infarction. No mass lesion, hemorrhage, hydrocephalus or extra-axial collection. Vascular: Major vessels at the base of the brain show flow. Skull and upper cervical spine: Low signal of the marrow spaces of the skull base and cervical spine. Whereas this could simply be due to increased hematopoietic elements, the appearance is somewhat patchy and therefore concerning for the potential of osseous metastatic disease. No evidence of fracture or extraosseous extension. Sinuses/Orbits: Clear/normal Other: None IMPRESSION: 1. No acute intracranial finding. Advanced chronic small-vessel ischemic changes throughout the brain as outlined above. 2. Low signal of the marrow spaces of the skull base and cervical spine. Whereas this could simply be due to increased hematopoietic elements, the appearance is somewhat patchy and therefore concerning for osseous metastatic disease. No fracture or extraosseous extension. Electronically Signed   By: Nelson Chimes M.D.   On: 04/15/2020 18:56   EEG adult  Result Date: 04/16/2020 Lora Havens, MD     04/16/2020  8:59 AM Patient Name: Sarah Hampton MRN: [id number] Epilepsy Attending: Lora Havens Referring Physician/Provider: Dr. Fuller Plan Date: 04/16/2020 Duration: 26.13 mins Patient history: 87 year old female with transient aphasia and subsequent confusion.  EEG to evaluate for seizures. Level of alertness: Awake AEDs during EEG study: None Technical aspects: This EEG study was done with scalp electrodes positioned according  to the 10-20 International  system of electrode placement. Electrical activity was acquired at a sampling rate of 500Hz  and reviewed with a high frequency filter of 70Hz  and a low frequency filter of 1Hz . EEG data were recorded continuously and digitally stored. Description: The posterior dominant rhythm consists of 7.5 Hz activity of moderate voltage (25-35 uV) seen predominantly in posterior head regions, symmetric and reactive to eye opening and eye closing. EEG showed intermittent generalized 3 to 6 Hz theta-delta slowing. Physiologic photic driving was seen during photic stimulation.  Hyperventilation was not performed.   ABNORMALITY -Intermittent slow, generalized IMPRESSION: This study is suggestive of mild diffuse encephalopathy, nonspecific etiology. No seizures or epileptiform discharges were seen throughout the recording. Lora Havens   ECHOCARDIOGRAM COMPLETE  Result Date: 04/16/2020    ECHOCARDIOGRAM REPORT   Patient Name:   Sarah Hampton Date of Exam: 04/16/2020 Medical Rec #:  [id number]      Height:       59.0 in Accession #:    [id number]     Weight:       101.6 lb Date of Birth:  04-Nov-1930       BSA:          1.383 m Patient Age:    87 years       BP:           135/61 mmHg Patient Gender: F              HR:           75 bpm. Exam Location:  Inpatient Procedure: Color Doppler, Cardiac Doppler and 2D Echo Indications:    TIA 435.9 / G45.9  History:        Patient has prior history of Echocardiogram examinations, most                 recent 09/29/2017. TIA and Stroke; Risk Factors:Hypertension and                 Dyslipidemia.  Sonographer:    Darlina Sicilian RDCS Referring Phys: [id number] [place]  1. Left ventricular ejection fraction, by estimation, is 70 to 75%. The left ventricle has hyperdynamic function. The left ventricle has no regional wall motion abnormalities. Left ventricular diastolic parameters are indeterminate.  2. Right ventricular systolic function is normal. The right ventricular size is  normal. There is normal pulmonary artery systolic pressure.  3. Left atrial size was mildly dilated.  4. The mitral valve is normal in structure. Mild mitral valve regurgitation. No evidence of mitral stenosis. Severe mitral annular calcification.  5. The aortic valve has an indeterminant number of cusps. Aortic valve regurgitation is not visualized. No aortic stenosis is present.  6. The inferior vena cava is normal in size with greater than 50% respiratory variability, suggesting right atrial pressure of 3 mmHg. FINDINGS  Left Ventricle: Left ventricular ejection fraction, by estimation, is 70 to 75%. The left ventricle has hyperdynamic function. The left ventricle has no regional wall motion abnormalities. The left ventricular internal cavity size was normal in size. There is no left ventricular hypertrophy. Left ventricular diastolic parameters are indeterminate. Right Ventricle: The right ventricular size is normal. Right ventricular systolic function is normal. There is normal pulmonary artery systolic pressure. The tricuspid regurgitant velocity is 2.23 m/s, and with an assumed right atrial pressure of 8 mmHg,  the estimated right ventricular systolic pressure is 96.2 mmHg. Left Atrium: Left atrial size was mildly dilated. Right Atrium: Right  atrial size was normal in size. Pericardium: There is no evidence of pericardial effusion. Mitral Valve: The mitral valve is normal in structure. Severe mitral annular calcification. Mild mitral valve regurgitation. No evidence of mitral valve stenosis. Tricuspid Valve: The tricuspid valve is normal in structure. Tricuspid valve regurgitation is mild . No evidence of tricuspid stenosis. Aortic Valve: The aortic valve has an indeterminant number of cusps. Aortic valve regurgitation is not visualized. No aortic stenosis is present. Aortic valve mean gradient measures 5.0 mmHg. Aortic valve peak gradient measures 10.3 mmHg. Pulmonic Valve: The pulmonic valve was not well  visualized. Pulmonic valve regurgitation is not visualized. No evidence of pulmonic stenosis. Aorta: The aortic root is normal in size and structure. Venous: The inferior vena cava is normal in size with greater than 50% respiratory variability, suggesting right atrial pressure of 3 mmHg.  LEFT VENTRICLE PLAX 2D LVIDd:         2.70 cm Diastology LVIDs:         1.90 cm LV e' medial:    5.77 cm/s LV PW:         0.80 cm LV E/e' medial:  15.0 LV IVS:        0.90 cm LV e' lateral:   6.96 cm/s                        LV E/e' lateral: 12.4  RIGHT VENTRICLE RV S prime:     12.30 cm/s TAPSE (M-mode): 2.1 cm LEFT ATRIUM             Index       RIGHT ATRIUM           Index LA diam:        4.20 cm 3.04 cm/m  RA Area:     10.60 cm LA Vol (A2C):   42.1 ml 30.44 ml/m RA Volume:   20.90 ml  15.11 ml/m LA Vol (A4C):   47.6 ml 34.42 ml/m LA Biplane Vol: 45.6 ml 32.97 ml/m  AORTIC VALVE AV Vmax:           160.50 cm/s AV Vmean:          103.500 cm/s AV VTI:            0.330 m AV Peak Grad:      10.3 mmHg AV Mean Grad:      5.0 mmHg LVOT Vmax:         86.70 cm/s LVOT Vmean:        65.400 cm/s LVOT VTI:          0.204 m LVOT/AV VTI ratio: 0.62 MITRAL VALVE                TRICUSPID VALVE MV Area (PHT): 5.46 cm     TR Peak grad:   19.9 mmHg MV Decel Time: 139 msec     TR Vmax:        223.00 cm/s MV E velocity: 86.40 cm/s MV A velocity: 136.00 cm/s  SHUNTS MV E/A ratio:  0.64         Systemic VTI: 0.20 m Kirk Ruths MD Electronically signed by Kirk Ruths MD Signature Date/Time: 04/16/2020/12:22:55 PM    Final        Subjective: Patient seen and examined in ED.  Sitting comfortably on the bed.  Alert and oriented and communicating well.  Tells me that she is doing fine and denies any new complaints including headache, blurry vision,  slurred speech, facial droop, numbness weakness tingling sensation in hands or feet, lightheadedness, dizziness, nausea or vomiting.  Comfortable going home today.  Discharge Exam: Vitals:    04/16/20 1100 04/16/20 1225  BP: 135/61 (!) 128/58  Pulse: 75 87  Resp: 19 (!) 25  Temp:  98 F (36.7 C)  SpO2: 100% 100%   Vitals:   04/16/20 0915 04/16/20 1045 04/16/20 1100 04/16/20 1225  BP: (!) 143/123 (!) 125/59 135/61 (!) 128/58  Pulse: 80 79 75 87  Resp: 18 20 19  (!) 25  Temp:    98 F (36.7 C)  TempSrc:    Oral  SpO2: 100% 100% 100% 100%    General: Pt is alert, awake, not in acute distress, on room air, communicating well, alert and oriented x3, no slurred speech or facial droop noted Cardiovascular: RRR, S1/S2 +, no rubs, no gallops Respiratory: CTA bilaterally, no wheezing, no rhonchi Abdominal: Soft, NT, ND, bowel sounds + Extremities: no edema, no cyanosis, power 5 out of 5 in all 4 extremities.    The results of significant diagnostics from this hospitalization (including imaging, microbiology, ancillary and laboratory) are listed below for reference.     Microbiology: Recent Results (from the past 240 hour(s))  SARS Coronavirus 2 by RT PCR (hospital order, performed in [facility] hospital lab) Nasopharyngeal Nasopharyngeal Swab     Status: None   Collection Time: 04/15/20  5:31 PM   Specimen: Nasopharyngeal Swab  Result Value Ref Range Status   SARS Coronavirus 2 NEGATIVE NEGATIVE Final    Comment: (NOTE) SARS-CoV-2 target nucleic acids are NOT DETECTED.  The SARS-CoV-2 RNA is generally detectable in upper and lower respiratory specimens during the acute phase of infection. The lowest concentration of SARS-CoV-2 viral copies this assay can detect is 250 copies / mL. A negative result does not preclude SARS-CoV-2 infection and should not be used as the sole basis for treatment or other patient management decisions.  A negative result may occur with improper specimen collection / handling, submission of specimen other than nasopharyngeal swab, presence of viral mutation(s) within the areas targeted by this assay, and inadequate number of viral  copies (<250 copies / mL). A negative result must be combined with clinical observations, patient history, and epidemiological information.  Fact Sheet for Patients:   StrictlyIdeas.no  Fact Sheet for Healthcare Providers: BankingDealers.co.za  This test is not yet approved or  cleared by the [place] FDA and has been authorized for detection and/or diagnosis of SARS-CoV-2 by FDA under an Emergency Use Authorization (EUA).  This EUA will remain in effect (meaning this test can be used) for the duration of the COVID-19 declaration under Section 564(b)(1) of the Act, 21 U.S.C. section 360bbb-3(b)(1), unless the authorization is terminated or revoked sooner.  Performed at [facility], [place] [address] [place], [place] [address]      Labs: BNP (last 3 results) No results for input(s): BNP in the last 8760 hours. Basic Metabolic Panel: Recent Labs  Lab 04/15/20 1351 04/15/20 1437 04/16/20 0206  NA 130* 132* 132*  K 4.8 4.8 4.3  CL 96* 98 100  CO2 23  --  24  GLUCOSE 117* 109* 111*  BUN 27* 31* 28*  CREATININE 1.02* 1.00 1.22*  CALCIUM 9.3  --  8.9   Liver Function Tests: Recent Labs  Lab 04/15/20 1351  AST 30  ALT 34  ALKPHOS 77  BILITOT 0.9  PROT 6.6  ALBUMIN 4.1   No results for  input(s): LIPASE, AMYLASE in the last 168 hours. No results for input(s): AMMONIA in the last 168 hours. CBC: Recent Labs  Lab 04/15/20 1351 04/15/20 1437 04/16/20 0206  WBC 12.1*  --  9.8  NEUTROABS 9.3*  --   --   HGB 9.0* 9.9* 8.0*  HCT 30.2* 29.0* 26.1*  MCV 106.3*  --  105.2*  PLT 166  --  148*   Cardiac Enzymes: No results for input(s): CKTOTAL, CKMB, CKMBINDEX, TROPONINI in the last 168 hours. BNP: Invalid input(s): POCBNP CBG: Recent Labs  Lab 04/15/20 1352  GLUCAP 111*   D-Dimer No results for input(s): DDIMER in the last 72 hours. Hgb A1c Recent Labs    04/16/20 0447  HGBA1C 5.5   Lipid  Profile Recent Labs    04/16/20 0446  CHOL 105  HDL 49  LDLCALC 43  TRIG 66  CHOLHDL 2.1   Thyroid function studies No results for input(s): TSH, T4TOTAL, T3FREE, THYROIDAB in the last 72 hours.  Invalid input(s): FREET3 Anemia work up Recent Labs    04/16/20 0206  VITAMINB12 317  FOLATE 9.9   Urinalysis    Component Value Date/Time   COLORURINE YELLOW 04/16/2020 [place] 04/16/2020 0441   LABSPEC 1.028 04/16/2020 0441   PHURINE 7.0 04/16/2020 0441   GLUCOSEU NEGATIVE 04/16/2020 0441   HGBUR NEGATIVE 04/16/2020 0441   BILIRUBINUR NEGATIVE 04/16/2020 0441   KETONESUR NEGATIVE 04/16/2020 0441   PROTEINUR NEGATIVE 04/16/2020 0441   NITRITE NEGATIVE 04/16/2020 0441   LEUKOCYTESUR NEGATIVE 04/16/2020 0441   Sepsis Labs Invalid input(s): PROCALCITONIN,  WBC,  LACTICIDVEN Microbiology Recent Results (from the past 240 hour(s))  SARS Coronavirus 2 by RT PCR (hospital order, performed in [place] hospital lab) Nasopharyngeal Nasopharyngeal Swab     Status: None   Collection Time: 04/15/20  5:31 PM   Specimen: Nasopharyngeal Swab  Result Value Ref Range Status   SARS Coronavirus 2 NEGATIVE NEGATIVE Final    Comment: (NOTE) SARS-CoV-2 target nucleic acids are NOT DETECTED.  The SARS-CoV-2 RNA is generally detectable in upper and lower respiratory specimens during the acute phase of infection. The lowest concentration of SARS-CoV-2 viral copies this assay can detect is 250 copies / mL. A negative result does not preclude SARS-CoV-2 infection and should not be used as the sole basis for treatment or other patient management decisions.  A negative result may occur with improper specimen collection / handling, submission of specimen other than nasopharyngeal swab, presence of viral mutation(s) within the areas targeted by this assay, and inadequate number of viral copies (<250 copies / mL). A negative result must be combined with clinical observations,  patient history, and epidemiological information.  Fact Sheet for Patients:   StrictlyIdeas.no  Fact Sheet for Healthcare Providers: BankingDealers.co.za  This test is not yet approved or  cleared by the [place] FDA and has been authorized for detection and/or diagnosis of SARS-CoV-2 by FDA under an Emergency Use Authorization (EUA).  This EUA will remain in effect (meaning this test can be used) for the duration of the COVID-19 declaration under Section 564(b)(1) of the Act, 21 U.S.C. section 360bbb-3(b)(1), unless the authorization is terminated or revoked sooner.  Performed at [facility], [place] [address], [place], [place] [address]      Time coordinating discharge: Over 30 minutes  SIGNED:   Mckinley Jewel, MD  [facility] 04/16/2020, 3:24 PM Pager   If 7PM-7AM, please contact night-coverage [email or web address]

## 2020-04-16 NOTE — Procedures (Addendum)
Patient Name: Sarah Hampton  MRN: [id number]  Epilepsy Attending: Lora Havens  Referring Physician/Provider: Dr. Fuller Plan Date: 04/16/2020 Duration: 26.13 mins  Patient history: 87 year old female with transient aphasia and subsequent confusion.  EEG to evaluate for seizures.  Level of alertness: Awake  AEDs during EEG study: None  Technical aspects: This EEG study was done with scalp electrodes positioned according to the 10-20 International system of electrode placement. Electrical activity was acquired at a sampling rate of 500Hz  and reviewed with a high frequency filter of 70Hz  and a low frequency filter of 1Hz . EEG data were recorded continuously and digitally stored.   Description: The posterior dominant rhythm consists of 7.5 Hz activity of moderate voltage (25-35 uV) seen predominantly in posterior head regions, symmetric and reactive to eye opening and eye closing. EEG showed intermittent generalized 3 to 6 Hz theta-delta slowing. Physiologic photic driving was seen during photic stimulation.  Hyperventilation was not performed.     ABNORMALITY -Intermittent slow, generalized  IMPRESSION: This study is suggestive of mild diffuse encephalopathy, nonspecific etiology. No seizures or epileptiform discharges were seen throughout the recording.  [name] Barbra Sarks

## 2020-04-16 NOTE — Progress Notes (Signed)
EEG complete - results pending 

## 2020-04-16 NOTE — Discharge Planning (Signed)
RNCM met with pt and daughter Sarah Hampton) at bedside to answer questions regarding home health and billing.  RNCM advised that [facility] will contact them prior to first visit.  DJ advised that will go home with her ([place], [place]).  Information relayed to [facility] with [place].  DJ inquired about what insurance pays for regarding her observation stay and home health services.  RNCM answered questions and advised her to contact insurance company for detailed information.

## 2020-04-16 NOTE — Progress Notes (Signed)
  Echocardiogram 2D Echocardiogram has been performed.  Darlina Sicilian M 04/16/2020, 11:49 AM

## 2020-04-17 ENCOUNTER — Telehealth: Payer: Self-pay | Admitting: *Deleted

## 2020-04-17 DIAGNOSIS — N179 Acute kidney failure, unspecified: Secondary | ICD-10-CM

## 2020-04-17 DIAGNOSIS — M170 Bilateral primary osteoarthritis of knee: Secondary | ICD-10-CM

## 2020-04-17 DIAGNOSIS — I081 Rheumatic disorders of both mitral and tricuspid valves: Secondary | ICD-10-CM

## 2020-04-17 DIAGNOSIS — N1831 Chronic kidney disease, stage 3a: Secondary | ICD-10-CM

## 2020-04-17 DIAGNOSIS — G40909 Epilepsy, unspecified, not intractable, without status epilepticus: Secondary | ICD-10-CM

## 2020-04-17 DIAGNOSIS — I69351 Hemiplegia and hemiparesis following cerebral infarction affecting right dominant side: Secondary | ICD-10-CM

## 2020-04-17 DIAGNOSIS — D631 Anemia in chronic kidney disease: Secondary | ICD-10-CM

## 2020-04-17 DIAGNOSIS — I129 Hypertensive chronic kidney disease with stage 1 through stage 4 chronic kidney disease, or unspecified chronic kidney disease: Secondary | ICD-10-CM

## 2020-04-17 NOTE — Telephone Encounter (Signed)
Transition Care Management Follow-Up Telephone Call   Date discharged and where:04/16/2020 [facility]  How have you been since you were released from the hospital? Better  Any patient concerns? No  Items Reviewed:   Meds: Yes  Allergies:Yes  Dietary Changes Reviewed:Yes  Functional Questionnaire:  Independent-I Dependent-D  ADLs:I    Dressing- I    Eating-I   Maintaining continence-I   Transferring-I   Transportation-D   Meal Prep-D lives with daughter   Managing Meds- I  Confirmed importance and Date/Time of follow-up visits scheduled:04/19/2020 with Janett Billow   Confirmed with patient if condition worsens to call PCP or go to the Emergency Dept. Patient was given office number and encouraged to call back with questions or concerns: Yes

## 2020-04-17 NOTE — Care Management (Incomplete)
Ed CM met with patient's daughter Debra " DJ" to discuss HHH recommendations  HH services arranged with [facility] and [facility] orders with [facility] to be delivered tomorrow  

## 2020-04-18 ENCOUNTER — Other Ambulatory Visit: Payer: Self-pay | Admitting: Cardiology

## 2020-04-18 DIAGNOSIS — I639 Cerebral infarction, unspecified: Secondary | ICD-10-CM

## 2020-04-19 ENCOUNTER — Other Ambulatory Visit: Payer: Self-pay

## 2020-04-19 ENCOUNTER — Ambulatory Visit (INDEPENDENT_AMBULATORY_CARE_PROVIDER_SITE_OTHER): Payer: Medicare HMO | Admitting: Nurse Practitioner

## 2020-04-19 ENCOUNTER — Encounter: Payer: Self-pay | Admitting: Nurse Practitioner

## 2020-04-19 VITALS — BP 120/68 | HR 71 | Temp 96.6°F | Ht 59.0 in | Wt 104.0 lb

## 2020-04-19 DIAGNOSIS — E785 Hyperlipidemia, unspecified: Secondary | ICD-10-CM

## 2020-04-19 DIAGNOSIS — I10 Essential (primary) hypertension: Secondary | ICD-10-CM

## 2020-04-19 DIAGNOSIS — R413 Other amnesia: Secondary | ICD-10-CM

## 2020-04-19 DIAGNOSIS — Z8673 Personal history of transient ischemic attack (TIA), and cerebral infarction without residual deficits: Secondary | ICD-10-CM

## 2020-04-19 DIAGNOSIS — E871 Hypo-osmolality and hyponatremia: Secondary | ICD-10-CM | POA: Diagnosis not present

## 2020-04-19 DIAGNOSIS — D72829 Elevated white blood cell count, unspecified: Secondary | ICD-10-CM

## 2020-04-19 DIAGNOSIS — I700 Atherosclerosis of aorta: Secondary | ICD-10-CM

## 2020-04-19 NOTE — Progress Notes (Signed)
Careteam: Patient Care Team: Lauree Chandler, NP as PCP - General (Geriatric Medicine)  PLACE OF SERVICE:  [place] Directive information Does Patient Have a Medical Advance Directive?: Yes, Type of Advance Directive: Living will, Does patient want to make changes to medical advance directive?: No - Patient declined  No Known Allergies  Chief Complaint  Patient presents with  . Transitions Of Care    Patient in office for hospital follow up visit. Patient was admitted 04/15/2020 and discharged on 04/16/2020. Patient was admitted for TIA. Would like to know if she will be able to fly to [place] or of she needs to check with neurologist.  . Best Practice Recommendations    Tetanus/Tdap     HPI: Patient is a 87 y.o. female here for hospital follow up.  She was discharged the evening of the 25th after an episode that was consistent with a TIA.  Work-up for stroke such as MRI brain, CTA head and neck were negative for acute findings. EEG negative for seizures and showed mild diffuse encephalopathy.  Patient was back to baseline and her symptoms resolved.  She was also evaluated by neurology and recommended to take aspirin 81 mg daily and Plavix 75 mg daily for 3 weeks and then Plavix alone.    She is staying with her daughter now but wants to get back to the apartment.  She has home health coming out for evaluation and management. OT has signed off but PT will be back.   Daughter has noted some memory inconsistency.   Review of Systems:  Review of Systems  Constitutional: Negative for chills, fever and weight loss.  HENT: Negative for tinnitus.   Respiratory: Negative for cough, sputum production and shortness of breath.   Cardiovascular: Negative for chest pain, palpitations and leg swelling.  Gastrointestinal: Negative for abdominal pain, constipation, diarrhea and heartburn.  Genitourinary: Negative for dysuria, frequency and urgency.  Musculoskeletal: Negative  for back pain, falls, joint pain and myalgias.  Skin: Negative.   Neurological: Negative for dizziness and headaches.  Psychiatric/Behavioral: Positive for memory loss. Negative for depression. The patient does not have insomnia.     Past Medical History:  Diagnosis Date  . Arthritis   . History of melanoma    shoulder  . History of squamous cell carcinoma   . History of TIA (transient ischemic attack)   . Hypertension   . Macrocytic anemia   . Mini stroke [facility])    Per [facility] New Patient Packet   . Other hyperlipidemia   . Personal history of malignant neoplasm of other organs and systems    Past Surgical History:  Procedure Laterality Date  . CATARACT EXTRACTION    . DENTAL SURGERY    . SQUAMOUS CELL CARCINOMA EXCISION    . TONSILLECTOMY  1946   Per [facility] New Patient Packet    Social History:   reports that she quit smoking about 51 years ago. Her smoking use included cigarettes. She has a 15.00 pack-year smoking history. She has never used smokeless tobacco. She reports current alcohol use. She reports that she does not use drugs.  Family History  Problem Relation Age of Onset  . Dementia Mother 42  . Breast cancer Mother   . Pneumonia Father 83  . Cancer Father   . Stroke Neg Hx     Medications: Patient's Medications  New Prescriptions   No medications on file  Previous Medications   ASPIRIN EC 81 MG EC  TABLET    Take 1 tablet (81 mg total) by mouth daily for 21 days. Swallow whole.   ATORVASTATIN (LIPITOR) 80 MG TABLET    Take 1 tablet (80 mg total) by mouth daily at 6 PM.   CALCIUM PO    Take 1 tablet by mouth daily.   CLOPIDOGREL (PLAVIX) 75 MG TABLET    Take 1 tablet (75 mg total) by mouth daily.   FLUTICASONE (FLONASE) 50 MCG/ACT NASAL SPRAY    Place 1 spray into both nostrils daily as needed (seasonal allergies).   LISINOPRIL (ZESTRIL) 20 MG TABLET    Take 1 tablet (20 mg total) by mouth daily.   OVER THE COUNTER MEDICATION    Apply 1 application topically daily  as needed (back pain). Devil's club cream   OVER THE COUNTER MEDICATION    Apply 1 application topically See admin instructions. CBD Recovery Cream 359m (2oz/674m use twice weekly as needed for back pain   VITAMIN B-12 (CYANOCOBALAMIN) 1000 MCG TABLET    Take 1 tablet (1,000 mcg total) by mouth daily.  Modified Medications   No medications on file  Discontinued Medications   No medications on file    Physical Exam:  Vitals:   04/19/20 1354  BP: 120/68  Pulse: 71  Temp: (!) 96.6 F (35.9 C)  TempSrc: Temporal  SpO2: 93%  Weight: 104 lb (47.2 kg)  Height: 4' 11"  (1.499 m)   Body mass index is 21.01 kg/m. Wt Readings from Last 3 Encounters:  04/19/20 104 lb (47.2 kg)  04/15/20 101 lb 9.6 oz (46.1 kg)  03/04/20 104 lb (47.2 kg)    Physical Exam Constitutional:      General: She is not in acute distress.    Appearance: She is well-developed and well-nourished. She is not diaphoretic.  HENT:     Head: Normocephalic and atraumatic.     Mouth/Throat:     Mouth: Oropharynx is clear and moist.     Pharynx: No oropharyngeal exudate.  Eyes:     Conjunctiva/sclera: Conjunctivae normal.     Pupils: Pupils are equal, round, and reactive to light.  Cardiovascular:     Rate and Rhythm: Normal rate and regular rhythm.     Heart sounds: Normal heart sounds.  Pulmonary:     Effort: Pulmonary effort is normal.     Breath sounds: Normal breath sounds.  Abdominal:     General: Bowel sounds are normal.     Palpations: Abdomen is soft.  Musculoskeletal:        General: No tenderness or edema.     Cervical back: Normal range of motion and neck supple.  Skin:    General: Skin is warm and dry.  Neurological:     Mental Status: She is alert and oriented to person, place, and time.  Psychiatric:        Mood and Affect: Mood and affect and mood normal.        Behavior: Behavior normal.    Labs reviewed: Basic Metabolic Panel: Recent Labs    06/10/19 0000 08/30/19 1135  03/04/20 1211 04/15/20 1351 04/15/20 1437 04/16/20 0206  NA 132*   < > 139 130* 132* 132*  K 4.5   < > 4.4 4.8 4.8 4.3  CL 99   < > 106 96* 98 100  CO2  --    < > 23 23  --  24  GLUCOSE  --    < > 85 117* 109* 111*  BUN 20   < >  17 27* 31* 28*  CREATININE 1.0   < > 0.97* 1.02* 1.00 1.22*  CALCIUM 9.1   < > 9.2 9.3  --  8.9  TSH 2.26  --   --   --   --   --    < > = values in this interval not displayed.   Liver Function Tests: Recent Labs    06/10/19 0000 08/30/19 1135 03/04/20 1211 04/15/20 1351  AST 24 26 27 30   ALT 26 36* 24 34  ALKPHOS 63  --   --  77  BILITOT  --  0.5 0.6 0.9  PROT  --  6.5 6.6 6.6  ALBUMIN  --   --   --  4.1   No results for input(s): LIPASE, AMYLASE in the last 8760 hours. No results for input(s): AMMONIA in the last 8760 hours. CBC: Recent Labs    03/04/20 1211 03/11/20 0948 04/15/20 1351 04/15/20 1437 04/16/20 0206  WBC 10.9* 10.8 12.1*  --  9.8  NEUTROABS 7,521 7,679 9.3*  --   --   HGB 8.7* 8.7* 9.0* 9.9* 8.0*  HCT 28.7* 28.2* 30.2* 29.0* 26.1*  MCV 104.0* 102.5* 106.3*  --  105.2*  PLT 184 182 166  --  148*   Lipid Panel: Recent Labs    06/10/19 0000 04/16/20 0446  CHOL 110 105  HDL 57 49  LDLCALC 38 43  TRIG 74 66  CHOLHDL  --  2.1   TSH: Recent Labs    06/10/19 0000  TSH 2.26   A1C: Lab Results  Component Value Date   HGBA1C 5.5 04/16/2020   CT Angio Head W or Wo Contrast  Result Date: 04/15/2020 CLINICAL DATA:  TIA.  Slurred speech and confusion. EXAM: CT ANGIOGRAPHY HEAD AND NECK TECHNIQUE: Multidetector CT imaging of the head and neck was performed using the standard protocol during bolus administration of intravenous contrast. Multiplanar CT image reconstructions and MIPs were obtained to evaluate the vascular anatomy. Carotid stenosis measurements (when applicable) are obtained utilizing NASCET criteria, using the distal internal carotid diameter as the denominator. CONTRAST:  56m OMNIPAQUE IOHEXOL 350 MG/ML  SOLN COMPARISON:  Head CT earlier same day.  MRI 09/28/2017. FINDINGS: CTA NECK FINDINGS Aortic arch: Aortic atherosclerotic calcification. Brachiocephalic vessel origins are not included in the exam. There is at least atherosclerotic plaque at the origins. Right carotid system: Common carotid artery shows plaque but is widely patent to the bifurcation region. Mild soft and calcified plaque at the carotid bifurcation and ICA bulb but no stenosis. Cervical ICA widely patent. Left carotid system: Common carotid artery shows plaque but is widely patent to the bifurcation region. Mild soft and calcified plaque at the carotid bifurcation and ICA bulb but no stenosis. Cervical ICA widely patent. Vertebral arteries: Both vertebral artery origins are patent. 30-50% stenosis on both sides. Beyond that, both vessels are widely patent through the cervical region to the foramen magnum. Skeleton: Advanced cervical spondylosis with disc space narrowing. No severe canal stenosis. Multilevel foraminal narrowing. Other neck: No mass or lymphadenopathy. Upper chest: Areas of pulmonary scarring. 7 mm ground-glass opacity in the right upper lobe. Review of the MIP images confirms the above findings CTA HEAD FINDINGS Anterior circulation: Both internal carotid arteries are patent through the skull base and siphon regions. The anterior and middle cerebral vessels are patent. No large or medium vessel occlusion. No correctable proximal stenosis. No aneurysm. Posterior circulation: Both vertebral arteries are patent through the foramen magnum. Both V4 segments  show atherosclerotic narrowing but are patent to the basilar. No focal basilar stenosis. Superior cerebellar and posterior cerebral arteries are patent. There are patent bilateral posterior communicating arteries. Venous sinuses: Patent and normal. Anatomic variants: None significant. Review of the MIP images confirms the above findings IMPRESSION: 1. No intracranial large or medium  vessel occlusion. 2. Atherosclerotic disease at both carotid bifurcations but without stenosis. 3. 30-50% stenoses at both vertebral artery origins. 30-50% stenosis of both vertebral artery V4 segments. 4. 7 mm ground-glass opacity in the right upper lobe. Emphysema and aortic atherosclerosis. Initial follow-up with CT at 6-12 months is recommended to confirm persistence. If persistent, repeat CT is recommended every 2 years until 5 years of stability has been established. This recommendation follows the consensus statement: Guidelines for Management of Incidental Pulmonary Nodules Detected on CT Images: From the Fleischner Society 2017; Radiology 2017; 284:228-243. Aortic Atherosclerosis (ICD10-I70.0) and Emphysema (ICD10-J43.9). Electronically Signed   By: Nelson Chimes M.D.   On: 04/15/2020 17:34   DG Chest 2 View  Result Date: 04/15/2020 CLINICAL DATA:  TIA EXAM: CHEST - 2 VIEW COMPARISON:  None. FINDINGS: Heart is normal size. Aortic calcifications. No confluent airspace opacities or effusions. No acute bony abnormality. IMPRESSION: No active cardiopulmonary disease. Electronically Signed   By: Rolm Baptise M.D.   On: 04/15/2020 19:00   CT HEAD WO CONTRAST  Result Date: 04/15/2020 CLINICAL DATA:  TIA. Possible seizure last night. Episode of slurred speech and confusion this morning, now resolved. EXAM: CT HEAD WITHOUT CONTRAST TECHNIQUE: Contiguous axial images were obtained from the base of the skull through the vertex without intravenous contrast. COMPARISON:  Head CT and MRI 09/28/2017 FINDINGS: Brain: There is no evidence of an acute infarct, intracranial hemorrhage, mass, midline shift, or extra-axial fluid collection. Patchy hypodensities in the cerebral white matter bilaterally are unchanged and nonspecific but compatible with moderate chronic small vessel ischemic disease. There is mild-to-moderate cerebral atrophy. Vascular: Calcified atherosclerosis at the skull base. No hyperdense vessel.  Skull: No fracture or suspicious osseous lesion. Sinuses/Orbits: Visualized paranasal sinuses and mastoid air cells are clear. Bilateral cataract extraction. Other: None. IMPRESSION: 1. No evidence of acute intracranial abnormality. 2. Moderate chronic small vessel ischemic disease. Electronically Signed   By: Logan Bores M.D.   On: 04/15/2020 14:28   CT Angio Neck W and/or Wo Contrast  Result Date: 04/15/2020 CLINICAL DATA:  TIA.  Slurred speech and confusion. EXAM: CT ANGIOGRAPHY HEAD AND NECK TECHNIQUE: Multidetector CT imaging of the head and neck was performed using the standard protocol during bolus administration of intravenous contrast. Multiplanar CT image reconstructions and MIPs were obtained to evaluate the vascular anatomy. Carotid stenosis measurements (when applicable) are obtained utilizing NASCET criteria, using the distal internal carotid diameter as the denominator. CONTRAST:  40m OMNIPAQUE IOHEXOL 350 MG/ML SOLN COMPARISON:  Head CT earlier same day.  MRI 09/28/2017. FINDINGS: CTA NECK FINDINGS Aortic arch: Aortic atherosclerotic calcification. Brachiocephalic vessel origins are not included in the exam. There is at least atherosclerotic plaque at the origins. Right carotid system: Common carotid artery shows plaque but is widely patent to the bifurcation region. Mild soft and calcified plaque at the carotid bifurcation and ICA bulb but no stenosis. Cervical ICA widely patent. Left carotid system: Common carotid artery shows plaque but is widely patent to the bifurcation region. Mild soft and calcified plaque at the carotid bifurcation and ICA bulb but no stenosis. Cervical ICA widely patent. Vertebral arteries: Both vertebral artery origins are patent. 30-50% stenosis on  both sides. Beyond that, both vessels are widely patent through the cervical region to the foramen magnum. Skeleton: Advanced cervical spondylosis with disc space narrowing. No severe canal stenosis. Multilevel foraminal  narrowing. Other neck: No mass or lymphadenopathy. Upper chest: Areas of pulmonary scarring. 7 mm ground-glass opacity in the right upper lobe. Review of the MIP images confirms the above findings CTA HEAD FINDINGS Anterior circulation: Both internal carotid arteries are patent through the skull base and siphon regions. The anterior and middle cerebral vessels are patent. No large or medium vessel occlusion. No correctable proximal stenosis. No aneurysm. Posterior circulation: Both vertebral arteries are patent through the foramen magnum. Both V4 segments show atherosclerotic narrowing but are patent to the basilar. No focal basilar stenosis. Superior cerebellar and posterior cerebral arteries are patent. There are patent bilateral posterior communicating arteries. Venous sinuses: Patent and normal. Anatomic variants: None significant. Review of the MIP images confirms the above findings IMPRESSION: 1. No intracranial large or medium vessel occlusion. 2. Atherosclerotic disease at both carotid bifurcations but without stenosis. 3. 30-50% stenoses at both vertebral artery origins. 30-50% stenosis of both vertebral artery V4 segments. 4. 7 mm ground-glass opacity in the right upper lobe. Emphysema and aortic atherosclerosis. Initial follow-up with CT at 6-12 months is recommended to confirm persistence. If persistent, repeat CT is recommended every 2 years until 5 years of stability has been established. This recommendation follows the consensus statement: Guidelines for Management of Incidental Pulmonary Nodules Detected on CT Images: From the Fleischner Society 2017; Radiology 2017; 284:228-243. Aortic Atherosclerosis (ICD10-I70.0) and Emphysema (ICD10-J43.9). Electronically Signed   By: Nelson Chimes M.D.   On: 04/15/2020 17:34   MR BRAIN WO CONTRAST  Result Date: 04/15/2020 CLINICAL DATA:  Seizure like activity and speech disturbance. Confusion. EXAM: MRI HEAD WITHOUT CONTRAST TECHNIQUE: Multiplanar, multiecho  pulse sequences of the brain and surrounding structures were obtained without intravenous contrast. COMPARISON:  CT studies same day.  MRI 09/28/2017. FINDINGS: Brain: Diffusion imaging does not show any acute or subacute infarction. Mild chronic small-vessel ischemic changes affect the pons. No focal cerebellar insult. Cerebral hemispheres show pronounced chronic small-vessel disease of the deep and subcortical white matter. No cortical or large vessel territory infarction. No mass lesion, hemorrhage, hydrocephalus or extra-axial collection. Vascular: Major vessels at the base of the brain show flow. Skull and upper cervical spine: Low signal of the marrow spaces of the skull base and cervical spine. Whereas this could simply be due to increased hematopoietic elements, the appearance is somewhat patchy and therefore concerning for the potential of osseous metastatic disease. No evidence of fracture or extraosseous extension. Sinuses/Orbits: Clear/normal Other: None IMPRESSION: 1. No acute intracranial finding. Advanced chronic small-vessel ischemic changes throughout the brain as outlined above. 2. Low signal of the marrow spaces of the skull base and cervical spine. Whereas this could simply be due to increased hematopoietic elements, the appearance is somewhat patchy and therefore concerning for osseous metastatic disease. No fracture or extraosseous extension. Electronically Signed   By: Nelson Chimes M.D.   On: 04/15/2020 18:56   EEG adult  Result Date: 04/16/2020 Lora Havens, MD     04/16/2020  8:59 AM Patient Name: Riki Berninger MRN: [id number] Epilepsy Attending: Lora Havens Referring Physician/Provider: Dr. Fuller Plan Date: 04/16/2020 Duration: 26.13 mins Patient history: 87 year old female with transient aphasia and subsequent confusion.  EEG to evaluate for seizures. Level of alertness: Awake AEDs during EEG study: None Technical aspects: This EEG study was done with scalp  electrodes  positioned according to the 10-20 International system of electrode placement. Electrical activity was acquired at a sampling rate of 500Hz  and reviewed with a high frequency filter of 70Hz  and a low frequency filter of 1Hz . EEG data were recorded continuously and digitally stored. Description: The posterior dominant rhythm consists of 7.5 Hz activity of moderate voltage (25-35 uV) seen predominantly in posterior head regions, symmetric and reactive to eye opening and eye closing. EEG showed intermittent generalized 3 to 6 Hz theta-delta slowing. Physiologic photic driving was seen during photic stimulation.  Hyperventilation was not performed.   ABNORMALITY -Intermittent slow, generalized IMPRESSION: This study is suggestive of mild diffuse encephalopathy, nonspecific etiology. No seizures or epileptiform discharges were seen throughout the recording. Lora Havens   ECHOCARDIOGRAM COMPLETE  Result Date: 04/16/2020    ECHOCARDIOGRAM REPORT   Patient Name:   DILLAN LUNDEN Date of Exam: 04/16/2020 Medical Rec #:  [id number]      Height:       59.0 in Accession #:    [id number]     Weight:       101.6 lb Date of Birth:  1931/01/31       BSA:          1.383 m Patient Age:    89 years       BP:           135/61 mmHg Patient Gender: F              HR:           75 bpm. Exam Location:  Inpatient Procedure: Color Doppler, Cardiac Doppler and 2D Echo Indications:    TIA 435.9 / G45.9  History:        Patient has prior history of Echocardiogram examinations, most                 recent 09/29/2017. TIA and Stroke; Risk Factors:Hypertension and                 Dyslipidemia.  Sonographer:    Darlina Sicilian RDCS Referring Phys: [id number] [place]  1. Left ventricular ejection fraction, by estimation, is 70 to 75%. The left ventricle has hyperdynamic function. The left ventricle has no regional wall motion abnormalities. Left ventricular diastolic parameters are indeterminate.  2. Right ventricular systolic  function is normal. The right ventricular size is normal. There is normal pulmonary artery systolic pressure.  3. Left atrial size was mildly dilated.  4. The mitral valve is normal in structure. Mild mitral valve regurgitation. No evidence of mitral stenosis. Severe mitral annular calcification.  5. The aortic valve has an indeterminant number of cusps. Aortic valve regurgitation is not visualized. No aortic stenosis is present.  6. The inferior vena cava is normal in size with greater than 50% respiratory variability, suggesting right atrial pressure of 3 mmHg. FINDINGS  Left Ventricle: Left ventricular ejection fraction, by estimation, is 70 to 75%. The left ventricle has hyperdynamic function. The left ventricle has no regional wall motion abnormalities. The left ventricular internal cavity size was normal in size. There is no left ventricular hypertrophy. Left ventricular diastolic parameters are indeterminate. Right Ventricle: The right ventricular size is normal. Right ventricular systolic function is normal. There is normal pulmonary artery systolic pressure. The tricuspid regurgitant velocity is 2.23 m/s, and with an assumed right atrial pressure of 8 mmHg,  the estimated right ventricular systolic pressure is 31.5 mmHg. Left Atrium: Left atrial size was mildly dilated.  Right Atrium: Right atrial size was normal in size. Pericardium: There is no evidence of pericardial effusion. Mitral Valve: The mitral valve is normal in structure. Severe mitral annular calcification. Mild mitral valve regurgitation. No evidence of mitral valve stenosis. Tricuspid Valve: The tricuspid valve is normal in structure. Tricuspid valve regurgitation is mild . No evidence of tricuspid stenosis. Aortic Valve: The aortic valve has an indeterminant number of cusps. Aortic valve regurgitation is not visualized. No aortic stenosis is present. Aortic valve mean gradient measures 5.0 mmHg. Aortic valve peak gradient measures 10.3 mmHg.  Pulmonic Valve: The pulmonic valve was not well visualized. Pulmonic valve regurgitation is not visualized. No evidence of pulmonic stenosis. Aorta: The aortic root is normal in size and structure. Venous: The inferior vena cava is normal in size with greater than 50% respiratory variability, suggesting right atrial pressure of 3 mmHg.  LEFT VENTRICLE PLAX 2D LVIDd:         2.70 cm Diastology LVIDs:         1.90 cm LV e' medial:    5.77 cm/s LV PW:         0.80 cm LV E/e' medial:  15.0 LV IVS:        0.90 cm LV e' lateral:   6.96 cm/s                        LV E/e' lateral: 12.4  RIGHT VENTRICLE RV S prime:     12.30 cm/s TAPSE (M-mode): 2.1 cm LEFT ATRIUM             Index       RIGHT ATRIUM           Index LA diam:        4.20 cm 3.04 cm/m  RA Area:     10.60 cm LA Vol (A2C):   42.1 ml 30.44 ml/m RA Volume:   20.90 ml  15.11 ml/m LA Vol (A4C):   47.6 ml 34.42 ml/m LA Biplane Vol: 45.6 ml 32.97 ml/m  AORTIC VALVE AV Vmax:           160.50 cm/s AV Vmean:          103.500 cm/s AV VTI:            0.330 m AV Peak Grad:      10.3 mmHg AV Mean Grad:      5.0 mmHg LVOT Vmax:         86.70 cm/s LVOT Vmean:        65.400 cm/s LVOT VTI:          0.204 m LVOT/AV VTI ratio: 0.62 MITRAL VALVE                TRICUSPID VALVE MV Area (PHT): 5.46 cm     TR Peak grad:   19.9 mmHg MV Decel Time: 139 msec     TR Vmax:        223.00 cm/s MV E velocity: 86.40 cm/s MV A velocity: 136.00 cm/s  SHUNTS MV E/A ratio:  0.64         Systemic VTI: 0.20 m Kirk Ruths MD Electronically signed by Kirk Ruths MD Signature Date/Time: 04/16/2020/12:22:55 PM    Final     Assessment/Plan 1. Aortic atherosclerosis ([place]) -noted on imaging. On plavix and asa now as well as stating.   2. Leukocytosis, unspecified type -will follow up today to make sure this has resolved.  - CBC with  Differential/Platelet  3. Hyponatremia Noted during hospitalization, will follow up.  - BMP with eGFR([facility])  4. History of TIA (transient ischemic  attack) -evaluation by neuro, no acute event noted on imaging. Evaluation for seizures also being done. Continues on asa and plavix for 3 weeks then plavix alone.   5. Hyperlipidemia LDL goal <70 -LDL at goal at this time. Continues on lipitor.   6. Essential hypertension Controlled on lisinopril with dietary modifications.   7. Memory loss -daughter has noticed some issues with memory, MRI reveals Advanced chronic small-vessel ischemic changes throughout the brain. Will update 6CIT with next AWV.   Next appt: 07/08/2020 as scheduled.  Carlos American. [place], [facility] [phone]

## 2020-04-19 NOTE — Patient Instructions (Signed)
Add vit D 2000 units daily   ASA 81 mg daily for 3 weeks with plavix, then plavix alone

## 2020-04-20 LAB — CBC WITH DIFFERENTIAL/PLATELET
Absolute Monocytes: 752 cells/uL (ref 200–950)
Basophils Absolute: 124 cells/uL (ref 0–200)
Basophils Relative: 1.2 %
Eosinophils Absolute: 391 cells/uL (ref 15–500)
Eosinophils Relative: 3.8 %
HCT: 28 % — ABNORMAL LOW (ref 35.0–45.0)
Hemoglobin: 8.6 g/dL — ABNORMAL LOW (ref 11.7–15.5)
Lymphs Abs: 1627 cells/uL (ref 850–3900)
MCH: 31.2 pg (ref 27.0–33.0)
MCHC: 30.7 g/dL — ABNORMAL LOW (ref 32.0–36.0)
MCV: 101.4 fL — ABNORMAL HIGH (ref 80.0–100.0)
Monocytes Relative: 7.3 %
Neutro Abs: 7406 cells/uL (ref 1500–7800)
Neutrophils Relative %: 71.9 %
Platelets: 196 10*3/uL (ref 140–400)
RBC: 2.76 10*6/uL — ABNORMAL LOW (ref 3.80–5.10)
RDW: 20.6 % — ABNORMAL HIGH (ref 11.0–15.0)
Total Lymphocyte: 15.8 %
WBC: 10.3 10*3/uL (ref 3.8–10.8)

## 2020-04-20 LAB — BASIC METABOLIC PANEL WITH GFR
BUN/Creatinine Ratio: 24 (calc) — ABNORMAL HIGH (ref 6–22)
BUN: 26 mg/dL — ABNORMAL HIGH (ref 7–25)
CO2: 25 mmol/L (ref 20–32)
Calcium: 9.4 mg/dL (ref 8.6–10.4)
Chloride: 100 mmol/L (ref 98–110)
Creat: 1.08 mg/dL — ABNORMAL HIGH (ref 0.60–0.88)
GFR, Est African American: 53 mL/min/{1.73_m2} — ABNORMAL LOW (ref >=60)
GFR, Est Non African American: 46 mL/min/{1.73_m2} — ABNORMAL LOW (ref >=60)
Glucose, Bld: 82 mg/dL (ref 65–139)
Potassium: 4.7 mmol/L (ref 3.5–5.3)
Sodium: 133 mmol/L — ABNORMAL LOW (ref 135–146)

## 2020-04-21 LAB — METHYLMALONIC ACID, SERUM: Methylmalonic Acid, Quantitative: 275 nmol/L (ref 0–378)

## 2020-04-24 ENCOUNTER — Encounter: Payer: Self-pay | Admitting: *Deleted

## 2020-04-24 NOTE — Progress Notes (Signed)
Patient ID: Sarah Hampton, female   DOB: Nov 20, 1930, 87 y.o.   MRN: [id number] Patient enrolled for [employer] to ship a 30 day cardiac event monitor to her home. Letter with instructions mailed to patient.

## 2020-05-07 ENCOUNTER — Telehealth: Payer: Self-pay | Admitting: Neurology

## 2020-05-07 ENCOUNTER — Ambulatory Visit: Payer: Medicare HMO

## 2020-05-07 DIAGNOSIS — R569 Unspecified convulsions: Secondary | ICD-10-CM | POA: Diagnosis not present

## 2020-05-07 NOTE — Telephone Encounter (Signed)
I called and talk with the daughter and the patient.  The EEG study was relatively unremarkable, it did show some mild borderline background slowing, could be consistent with a memory disorder.  No epileptiform discharges were seen.

## 2020-05-07 NOTE — Procedures (Signed)
     History: Sarah Hampton is an 87 year old patient who was seen through the emergency room on 15 April 2018 with an event that possibly represented a seizure.  The patient had an episode of slumping over with 15 seconds of upper extremity shaking.  She did not bite her tongue or lose control of bowels or the bladder.  She did not have any confusion after the episode.  She is being evaluated for possible seizure.  This is a routine EEG.  No skull defects noted.  Medications include Lipitor, calcium supplementation, Plavix, and Zestril.  EEG classification: Dysrhythmia grade 1 generalized  Description of recording: The background rhythms this recording consists of fairly well-modulated medium amplitude background activity of 7 to 8 Hz.  Photic stimulation is performed, this results in a bilateral and symmetric photic driving response.  Hyperventilation is also performed with a minimal buildup of the background rhythm activities without significant slowing seen.  At no time during the recording does there appear to be evidence of spike or spike-wave discharges or evidence of focal slowing.  There is eyeblink artifact throughout the recording.  EKG monitor shows no evidence of cardiac rhythm abnormalities with a heart rate of 84.  Impression: This is a minimally abnormal EEG recording due to slight background slowing.  No epileptiform discharges are seen.  Slight slowing such as this is most commonly seen with an underlying dementing illness, but may be seen with a mild toxic or metabolic encephalopathy.

## 2020-05-11 ENCOUNTER — Ambulatory Visit (INDEPENDENT_AMBULATORY_CARE_PROVIDER_SITE_OTHER): Payer: Medicare HMO

## 2020-05-11 DIAGNOSIS — I639 Cerebral infarction, unspecified: Secondary | ICD-10-CM

## 2020-05-11 DIAGNOSIS — I4891 Unspecified atrial fibrillation: Secondary | ICD-10-CM | POA: Diagnosis not present

## 2020-05-11 DIAGNOSIS — G459 Transient cerebral ischemic attack, unspecified: Secondary | ICD-10-CM | POA: Diagnosis not present

## 2020-05-13 ENCOUNTER — Other Ambulatory Visit: Payer: Medicare HMO

## 2020-06-11 ENCOUNTER — Other Ambulatory Visit: Payer: Self-pay | Admitting: Cardiology

## 2020-06-11 DIAGNOSIS — I4891 Unspecified atrial fibrillation: Secondary | ICD-10-CM

## 2020-06-11 DIAGNOSIS — G459 Transient cerebral ischemic attack, unspecified: Secondary | ICD-10-CM

## 2020-06-11 DIAGNOSIS — I639 Cerebral infarction, unspecified: Secondary | ICD-10-CM

## 2020-07-08 ENCOUNTER — Ambulatory Visit: Payer: Medicare HMO | Admitting: Nurse Practitioner

## 2020-08-07 ENCOUNTER — Encounter: Payer: Self-pay | Admitting: Nurse Practitioner

## 2020-08-07 ENCOUNTER — Other Ambulatory Visit: Payer: Self-pay

## 2020-08-07 ENCOUNTER — Ambulatory Visit (INDEPENDENT_AMBULATORY_CARE_PROVIDER_SITE_OTHER): Payer: Medicare HMO | Admitting: Nurse Practitioner

## 2020-08-07 VITALS — BP 140/70 | HR 72 | Temp 97.5°F | Ht 59.0 in | Wt 101.2 lb

## 2020-08-07 DIAGNOSIS — Z8673 Personal history of transient ischemic attack (TIA), and cerebral infarction without residual deficits: Secondary | ICD-10-CM | POA: Diagnosis not present

## 2020-08-07 DIAGNOSIS — I10 Essential (primary) hypertension: Secondary | ICD-10-CM

## 2020-08-07 DIAGNOSIS — E538 Deficiency of other specified B group vitamins: Secondary | ICD-10-CM

## 2020-08-07 DIAGNOSIS — R413 Other amnesia: Secondary | ICD-10-CM | POA: Diagnosis not present

## 2020-08-07 DIAGNOSIS — E785 Hyperlipidemia, unspecified: Secondary | ICD-10-CM | POA: Diagnosis not present

## 2020-08-07 DIAGNOSIS — R569 Unspecified convulsions: Secondary | ICD-10-CM

## 2020-08-07 MED ORDER — VITAMIN B-12 1000 MCG PO TABS: 1000.0000 ug | ORAL_TABLET | Freq: Every day | ORAL | 2 refills | Status: DC

## 2020-08-07 NOTE — Progress Notes (Signed)
Careteam: Patient Care Team: Lauree Chandler, NP as PCP - General (Geriatric Medicine) Ngetich, Nelda Bucks, NP as Nurse Practitioner (Family Medicine)  PLACE OF SERVICE:  [place] Directive information Does Patient Have a Medical Advance Directive?: Yes, Type of Advance Directive: Healthcare Power of [place];Living will, Does patient want to make changes to medical advance directive?: No - Patient declined  No Known Allergies  Chief Complaint  Patient presents with  . Medical Management of Chronic Issues    4 month follow up.Patient till having TIAs every once in a while.Left arm gets numb and last 5 minutes.Patient had seizure while in [place]. Daughter concerned about weight. Squamous surgery next week. Should she get the next COVID booster. Would like to discuss when the right timing is for assisted living. Patient would like to discuss the need for some of her medications and wants to know if she is taking the right medication for TIA  . Health Maintenance    Discuss need for Tetanus/Tdap, COVID 19 booster (2nd booster)     HPI: Patient is a 87 y.o. female for routine follow up.  She was in [place] with sister and reported she had a seizure. Reports she had some shaking per daughter who spoke to the sister.  Reports her arm also goes numb occasionally- last a few minutes and resolves on its own.  Reports ongoing TIA- numbness to arm and not able to talk.  She has awareness when she has these episodes.   When she was with her sister she was not eating as much.   Scheduled for dexa  b12 def- did not start supplement.   Memory loss noted- worse when there is a lot going on.   Daughter feels like she would benefit from assisted living where there is more supervision. Worried about her having a TIA, seizure or fall and no one being around to help. Trying to get her to agree to go.  Review of Systems:  Review of Systems  Constitutional: Negative for chills,  fever and weight loss.  HENT: Negative for tinnitus.   Respiratory: Negative for cough, sputum production and shortness of breath.   Cardiovascular: Negative for chest pain, palpitations and leg swelling.  Gastrointestinal: Negative for abdominal pain, constipation, diarrhea and heartburn.  Genitourinary: Negative for dysuria, frequency and urgency.  Musculoskeletal: Negative for back pain, falls, joint pain and myalgias.  Skin: Negative.   Neurological: Negative for dizziness and headaches.  Psychiatric/Behavioral: Positive for memory loss. Negative for depression. The patient does not have insomnia.    Past Medical History:  Diagnosis Date  . Arthritis   . History of melanoma    shoulder  . History of squamous cell carcinoma   . History of TIA (transient ischemic attack)   . Hypertension   . Macrocytic anemia   . Mini stroke [facility])    Per [facility] New Patient Packet   . Other hyperlipidemia   . Personal history of malignant neoplasm of other organs and systems   . Seizure [facility])    Past Surgical History:  Procedure Laterality Date  . CATARACT EXTRACTION    . DENTAL SURGERY    . SQUAMOUS CELL CARCINOMA EXCISION    . TONSILLECTOMY  1946   Per [place] New Patient Packet    Social History:   reports that she quit smoking about 51 years ago. Her smoking use included cigarettes. She has a 15.00 pack-year smoking history. She has never used smokeless tobacco. She reports  current alcohol use. She reports that she does not use drugs.  Family History  Problem Relation Age of Onset  . Dementia Mother 67  . Breast cancer Mother   . Pneumonia Father 43  . Cancer Father   . Thyroid disease Daughter   . Stroke Neg Hx     Medications: Patient's Medications  New Prescriptions   No medications on file  Previous Medications   ATORVASTATIN (LIPITOR) 80 MG TABLET    Take 1 tablet (80 mg total) by mouth daily at 6 PM.   CALCIUM PO    Take 1 tablet by mouth daily.   CLOPIDOGREL (PLAVIX) 75 MG  TABLET    Take 1 tablet (75 mg total) by mouth daily.   FLUTICASONE (FLONASE) 50 MCG/ACT NASAL SPRAY    Place 1 spray into both nostrils daily as needed (seasonal allergies).   LISINOPRIL (ZESTRIL) 20 MG TABLET    Take 1 tablet (20 mg total) by mouth daily.   OVER THE COUNTER MEDICATION    Apply 1 application topically See admin instructions. CBD Recovery Cream 332m (2oz/633m use twice weekly as needed for back pain   VITAMIN B-12 (CYANOCOBALAMIN) 1000 MCG TABLET    Take 1 tablet (1,000 mcg total) by mouth daily.  Modified Medications   No medications on file  Discontinued Medications   OVER THE COUNTER MEDICATION    Apply 1 application topically daily as needed (back pain). [employer] cream    Physical Exam:  Vitals:   08/07/20 1159  BP: 140/70  Pulse: 72  Temp: (!) 97.5 F (36.4 C)  SpO2: 97%  Weight: 101 lb 3.2 oz (45.9 kg)  Height: _0  (1.499 m)   Body mass index is 20.44 kg/m. Wt Readings from Last 3 Encounters:  08/07/20 101 lb 3.2 oz (45.9 kg)  04/19/20 104 lb (47.2 kg)  04/15/20 101 lb 9.6 oz (46.1 kg)    Physical Exam Constitutional:      General: She is not in acute distress.    Appearance: She is well-developed. She is not diaphoretic.  HENT:     Head: Normocephalic and atraumatic.     Mouth/Throat:     Pharynx: No oropharyngeal exudate.  Eyes:     Conjunctiva/sclera: Conjunctivae normal.     Pupils: Pupils are equal, round, and reactive to light.  Cardiovascular:     Rate and Rhythm: Normal rate and regular rhythm.     Heart sounds: Normal heart sounds.  Pulmonary:     Effort: Pulmonary effort is normal.     Breath sounds: Normal breath sounds.  Abdominal:     General: Bowel sounds are normal.     Palpations: Abdomen is soft.  Musculoskeletal:        General: No tenderness.     Cervical back: Normal range of motion and neck supple.  Skin:    General: Skin is warm and dry.  Neurological:     Mental Status: She is alert and oriented to person,  place, and time.    Labs reviewed: Basic Metabolic Panel: Recent Labs    04/15/20 1351 04/15/20 1437 04/16/20 0206 04/19/20 0000  NA 130* 132* 132* 133*  K 4.8 4.8 4.3 4.7  CL 96* 98 100 100  CO2 23  --  24 25  GLUCOSE 117* 109* 111* 82  BUN 27* 31* 28* 26*  CREATININE 1.02* 1.00 1.22* 1.08*  CALCIUM 9.3  --  8.9 9.4   Liver Function Tests: Recent Labs  08/30/19 1135 03/04/20 1211 04/15/20 1351  AST _0 ALT 36* 24 34  ALKPHOS  --   --  77  BILITOT 0.5 0.6 0.9  PROT 6.5 6.6 6.6  ALBUMIN  --   --  4.1   No results for input(s): LIPASE, AMYLASE in the last 8760 hours. No results for input(s): AMMONIA in the last 8760 hours. CBC: Recent Labs    03/11/20 0948 04/15/20 1351 04/15/20 1437 04/16/20 0206 04/19/20 0000  WBC 10.8 12.1*  --  9.8 10.3  NEUTROABS 7,679 9.3*  --   --  7,406  HGB 8.7* 9.0* 9.9* 8.0* 8.6*  HCT 28.2* 30.2* 29.0* 26.1* 28.0*  MCV 102.5* 106.3*  --  105.2* 101.4*  PLT 182 166  --  148* 196   Lipid Panel: Recent Labs    04/16/20 0446  CHOL 105  HDL 49  LDLCALC 43  TRIG 66  CHOLHDL 2.1   TSH: No results for input(s): TSH in the last 8760 hours. A1C: Lab Results  Component Value Date   HGBA1C 5.5 04/16/2020     Assessment/Plan 1. History of TIA (transient ischemic attack) -reports repeat episodes that she feels are TIA. Continues on plavix - Ambulatory referral to Neurology for further evaluation  2. Hyperlipidemia LDL goal <70 -controlled on lipitor. Continue dietary modifications   3. Memory loss Ongoing. Overall does well but will mix up timelines/events. Her daughter checks in with her frequently. She is very independent but daughter wants her to move to assisted living for safety due to recurrent tia and hx of seizures. - TSH  4. Essential hypertension -mildly elevated today, to monitor at home, continues on lisinopril 20 mg daily  - CBC with Differential/Platelet - CMP with eGFR([facility])  5. Seizure  [facility]) Had another seizure in [place], does not remember episode - Ambulatory referral to Neurology  6. Vitamin B12 deficiency - vitamin B-12 (CYANOCOBALAMIN) 1000 MCG tablet; Take 1 tablet (1,000 mcg total) by mouth daily.  Dispense: 90 tablet; Refill: 2  Next appt: 3 months.  Carlos American. [place], [facility] [phone]

## 2020-08-07 NOTE — Patient Instructions (Signed)
Make sure you are getting 3 meals a day   Blood pressure goal <140/90  Low sodium diet  Make sure to start Vit B12 1000 mcg supplement daily  Would recommend assisted living facility

## 2020-08-08 ENCOUNTER — Other Ambulatory Visit: Payer: Self-pay

## 2020-08-08 DIAGNOSIS — D649 Anemia, unspecified: Secondary | ICD-10-CM

## 2020-08-08 DIAGNOSIS — E538 Deficiency of other specified B group vitamins: Secondary | ICD-10-CM

## 2020-08-08 MED ORDER — VITAMIN B-12 1000 MCG PO TABS: 1000.0000 ug | ORAL_TABLET | Freq: Every day | ORAL | 2 refills | Status: DC

## 2020-08-10 LAB — COMPLETE METABOLIC PANEL WITH GFR
AG Ratio: 2.3 (calc) (ref 1.0–2.5)
ALT: 25 U/L (ref 6–29)
AST: 25 U/L (ref 10–35)
Albumin: 4.1 g/dL (ref 3.6–5.1)
Alkaline phosphatase (APISO): 95 U/L (ref 37–153)
BUN/Creatinine Ratio: 23 (calc) — ABNORMAL HIGH (ref 6–22)
BUN: 22 mg/dL (ref 7–25)
CO2: 22 mmol/L (ref 20–32)
Calcium: 9.2 mg/dL (ref 8.6–10.4)
Chloride: 105 mmol/L (ref 98–110)
Creat: 0.96 mg/dL — ABNORMAL HIGH (ref 0.60–0.88)
GFR, Est African American: 61 mL/min/{1.73_m2} (ref >=60)
GFR, Est Non African American: 53 mL/min/{1.73_m2} — ABNORMAL LOW (ref >=60)
Globulin: 1.8 g/dL (calc) — ABNORMAL LOW (ref 1.9–3.7)
Glucose, Bld: 88 mg/dL (ref 65–139)
Potassium: 4.8 mmol/L (ref 3.5–5.3)
Sodium: 139 mmol/L (ref 135–146)
Total Bilirubin: 0.8 mg/dL (ref 0.2–1.2)
Total Protein: 5.9 g/dL — ABNORMAL LOW (ref 6.1–8.1)

## 2020-08-10 LAB — CBC WITH DIFFERENTIAL/PLATELET
Absolute Monocytes: 745 cells/uL (ref 200–950)
Basophils Absolute: 78 cells/uL (ref 0–200)
Basophils Relative: 0.8 %
Eosinophils Absolute: 245 cells/uL (ref 15–500)
Eosinophils Relative: 2.5 %
HCT: 25.5 % — ABNORMAL LOW (ref 35.0–45.0)
Hemoglobin: 7.8 g/dL — ABNORMAL LOW (ref 11.7–15.5)
Lymphs Abs: 1784 cells/uL (ref 850–3900)
MCH: 33.2 pg — ABNORMAL HIGH (ref 27.0–33.0)
MCHC: 30.6 g/dL — ABNORMAL LOW (ref 32.0–36.0)
MCV: 108.5 fL — ABNORMAL HIGH (ref 80.0–100.0)
Monocytes Relative: 7.6 %
Neutro Abs: 6948 cells/uL (ref 1500–7800)
Neutrophils Relative %: 70.9 %
Platelets: 112 10*3/uL — ABNORMAL LOW (ref 140–400)
RBC: 2.35 10*6/uL — ABNORMAL LOW (ref 3.80–5.10)
RDW: 21.2 % — ABNORMAL HIGH (ref 11.0–15.0)
Total Lymphocyte: 18.2 %
WBC: 9.8 10*3/uL (ref 3.8–10.8)

## 2020-08-10 LAB — TEST AUTHORIZATION

## 2020-08-10 LAB — TSH: TSH: 2.03 mIU/L (ref 0.40–4.50)

## 2020-08-10 LAB — IRON,TIBC AND FERRITIN PANEL
%SAT: 30 % (calc) (ref 16–45)
Ferritin: 73 ng/mL (ref 16–288)
Iron: 70 ug/dL (ref 45–160)
TIBC: 235 mcg/dL (calc) — ABNORMAL LOW (ref 250–450)

## 2020-08-13 ENCOUNTER — Other Ambulatory Visit: Payer: Self-pay

## 2020-08-13 ENCOUNTER — Other Ambulatory Visit: Payer: Medicare HMO

## 2020-08-13 DIAGNOSIS — D649 Anemia, unspecified: Secondary | ICD-10-CM

## 2020-08-13 LAB — CBC WITH DIFFERENTIAL/PLATELET
Absolute Monocytes: 803 cells/uL (ref 200–950)
Basophils Absolute: 113 cells/uL (ref 0–200)
Basophils Relative: 1.1 %
Eosinophils Absolute: 278 cells/uL (ref 15–500)
Eosinophils Relative: 2.7 %
HCT: 25 % — ABNORMAL LOW (ref 35.0–45.0)
Hemoglobin: 7.5 g/dL — ABNORMAL LOW (ref 11.7–15.5)
Lymphs Abs: 1452 cells/uL (ref 850–3900)
MCH: 32.1 pg (ref 27.0–33.0)
MCHC: 30 g/dL — ABNORMAL LOW (ref 32.0–36.0)
MCV: 106.8 fL — ABNORMAL HIGH (ref 80.0–100.0)
Monocytes Relative: 7.8 %
Neutro Abs: 7653 cells/uL (ref 1500–7800)
Neutrophils Relative %: 74.3 %
Platelets: 148 10*3/uL (ref 140–400)
RBC: 2.34 10*6/uL — ABNORMAL LOW (ref 3.80–5.10)
RDW: 20.3 % — ABNORMAL HIGH (ref 11.0–15.0)
Total Lymphocyte: 14.1 %
WBC: 10.3 10*3/uL (ref 3.8–10.8)

## 2020-08-14 LAB — FECAL GLOBIN BY IMMUNOCHEMISTRY
FECAL GLOBIN RESULT:: NOT DETECTED
MICRO NUMBER:: 11929566
SPECIMEN QUALITY:: ADEQUATE

## 2020-08-20 ENCOUNTER — Other Ambulatory Visit: Payer: Self-pay | Admitting: Nurse Practitioner

## 2020-08-20 DIAGNOSIS — E538 Deficiency of other specified B group vitamins: Secondary | ICD-10-CM

## 2020-08-23 ENCOUNTER — Other Ambulatory Visit: Payer: Self-pay

## 2020-08-23 ENCOUNTER — Other Ambulatory Visit: Payer: Medicare HMO

## 2020-08-23 DIAGNOSIS — E538 Deficiency of other specified B group vitamins: Secondary | ICD-10-CM

## 2020-08-26 LAB — CBC WITH DIFFERENTIAL/PLATELET
Absolute Monocytes: 978 cells/uL — ABNORMAL HIGH (ref 200–950)
Basophils Absolute: 150 cells/uL (ref 0–200)
Basophils Relative: 1.3 %
Eosinophils Absolute: 207 cells/uL (ref 15–500)
Eosinophils Relative: 1.8 %
HCT: 27.9 % — ABNORMAL LOW (ref 35.0–45.0)
Hemoglobin: 8.2 g/dL — ABNORMAL LOW (ref 11.7–15.5)
Lymphs Abs: 1702 cells/uL (ref 850–3900)
MCH: 31.5 pg (ref 27.0–33.0)
MCHC: 29.4 g/dL — ABNORMAL LOW (ref 32.0–36.0)
MCV: 107.3 fL — ABNORMAL HIGH (ref 80.0–100.0)
Monocytes Relative: 8.5 %
Neutro Abs: 8464 cells/uL — ABNORMAL HIGH (ref 1500–7800)
Neutrophils Relative %: 73.6 %
Platelets: 145 10*3/uL (ref 140–400)
RBC: 2.6 10*6/uL — ABNORMAL LOW (ref 3.80–5.10)
RDW: 21 % — ABNORMAL HIGH (ref 11.0–15.0)
Total Lymphocyte: 14.8 %
WBC: 11.5 10*3/uL — ABNORMAL HIGH (ref 3.8–10.8)

## 2020-08-26 LAB — VITAMIN B12: Vitamin B-12: 710 pg/mL (ref 200–1100)

## 2020-09-17 ENCOUNTER — Ambulatory Visit
Admission: RE | Admit: 2020-09-17 | Discharge: 2020-09-17 | Disposition: A | Discharge status: Home / Self Care | Payer: Medicare HMO | Source: Ambulatory Visit | Attending: Nurse Practitioner | Admitting: Nurse Practitioner

## 2020-09-17 ENCOUNTER — Other Ambulatory Visit: Payer: Self-pay

## 2020-09-17 DIAGNOSIS — E2839 Other primary ovarian failure: Secondary | ICD-10-CM

## 2020-09-20 ENCOUNTER — Other Ambulatory Visit: Payer: Self-pay

## 2020-09-20 MED ORDER — CALCIUM CARBONATE-VITAMIN D 600-400 MG-UNIT PO TABS: 1.0000 | ORAL_TABLET | Freq: Two times a day (BID) | ORAL | 11 refills | Status: DC

## 2020-09-30 ENCOUNTER — Other Ambulatory Visit: Payer: Self-pay | Admitting: Nurse Practitioner

## 2020-09-30 DIAGNOSIS — Z8673 Personal history of transient ischemic attack (TIA), and cerebral infarction without residual deficits: Secondary | ICD-10-CM

## 2020-09-30 DIAGNOSIS — E785 Hyperlipidemia, unspecified: Secondary | ICD-10-CM

## 2020-11-06 ENCOUNTER — Ambulatory Visit: Payer: Medicare HMO | Admitting: Nurse Practitioner

## 2020-11-08 ENCOUNTER — Encounter: Payer: Self-pay | Admitting: Nurse Practitioner

## 2020-11-08 ENCOUNTER — Other Ambulatory Visit: Payer: Self-pay

## 2020-11-08 ENCOUNTER — Ambulatory Visit (INDEPENDENT_AMBULATORY_CARE_PROVIDER_SITE_OTHER): Payer: Medicare HMO | Admitting: Nurse Practitioner

## 2020-11-08 VITALS — BP 142/70 | HR 91 | Temp 97.1°F | Ht 59.0 in | Wt 100.4 lb

## 2020-11-08 DIAGNOSIS — E538 Deficiency of other specified B group vitamins: Secondary | ICD-10-CM | POA: Diagnosis not present

## 2020-11-08 DIAGNOSIS — Z8673 Personal history of transient ischemic attack (TIA), and cerebral infarction without residual deficits: Secondary | ICD-10-CM

## 2020-11-08 DIAGNOSIS — R413 Other amnesia: Secondary | ICD-10-CM

## 2020-11-08 DIAGNOSIS — D649 Anemia, unspecified: Secondary | ICD-10-CM

## 2020-11-08 DIAGNOSIS — R569 Unspecified convulsions: Secondary | ICD-10-CM

## 2020-11-08 DIAGNOSIS — I10 Essential (primary) hypertension: Secondary | ICD-10-CM

## 2020-11-08 NOTE — Patient Instructions (Signed)
Goal < 140/90

## 2020-11-08 NOTE — Progress Notes (Signed)
Careteam: Patient Care Team: Lauree Chandler, NP as PCP - General (Geriatric Medicine) Ngetich, Nelda Bucks, NP as Nurse Practitioner (Family Medicine)  PLACE OF SERVICE:  [place] Directive information Does Patient Have a Medical Advance Directive?: Yes, Type of Advance Directive: Living will, Does patient want to make changes to medical advance directive?: No - Patient declined  No Known Allergies  Chief Complaint  Patient presents with   Medical Management of Chronic Issues    3 month follow-up. Discuss need for td/tdap, shingrix, covid #4, and flu (not in stock at [facility]) vaccines or exclude. Here with daughter Neoma Laming.      HPI: Patient is a 87 y.o. female for routine follow up.   She did not schedule appt with neurology.  Hx of TIA and seizure, she was referred to neurology but has not had appt.  Reports she is no longer having numbness but has OA pain.   She has a sharp pain in her thumb, uses cream and it goes away.   She does not remember seizures. Daughter feels like she is ignoring symptoms because she does not want to go to the hospital. Last seizure was in may.  She is eating better at this time. She is now cooking for herself and eating.    Review of Systems:  Review of Systems  Constitutional:  Negative for chills, fever and weight loss.  HENT:  Negative for tinnitus.   Respiratory:  Negative for cough, sputum production and shortness of breath.   Cardiovascular:  Negative for chest pain, palpitations and leg swelling.  Gastrointestinal:  Negative for abdominal pain, constipation, diarrhea and heartburn.  Genitourinary:  Negative for dysuria, frequency and urgency.  Musculoskeletal:  Positive for back pain, joint pain and myalgias. Negative for falls.  Skin: Negative.   Neurological:  Negative for dizziness and headaches.  Psychiatric/Behavioral:  Positive for memory loss. Negative for depression. The patient does not have insomnia.    Past  Medical History:  Diagnosis Date   Arthritis    History of melanoma    shoulder   History of squamous cell carcinoma    History of TIA (transient ischemic attack)    Hypertension    Macrocytic anemia    Mini stroke ([place])    Per [place] New Patient Packet    Other hyperlipidemia    Personal history of malignant neoplasm of other organs and systems    Seizure [facility])    Past Surgical History:  Procedure Laterality Date   CATARACT EXTRACTION     DENTAL SURGERY     SKIN SURGERY  07/2018   SQUAMOUS CELL CARCINOMA EXCISION     TONSILLECTOMY  1946   Per [place] New Patient Packet    Social History:   reports that she quit smoking about 52 years ago. Her smoking use included cigarettes. She has a 15.00 pack-year smoking history. She has never used smokeless tobacco. She reports that she does not currently use alcohol. She reports that she does not use drugs.  Family History  Problem Relation Age of Onset   Dementia Mother 76   Breast cancer Mother    Pneumonia Father 35   Cancer Father    Thyroid disease Daughter    Thyroid disease Daughter    Stroke Neg Hx     Medications: Patient's Medications  New Prescriptions   No medications on file  Previous Medications   ATORVASTATIN (LIPITOR) 80 MG TABLET    TAKE 1 TABLET EVERY  DAY  AT  6PM   CALCIUM CARBONATE-VITAMIN D 600-400 MG-UNIT TABLET    Take 1 tablet by mouth 2 (two) times daily.   CLOPIDOGREL (PLAVIX) 75 MG TABLET    TAKE 1 TABLET EVERY DAY   FLUTICASONE (FLONASE) 50 MCG/ACT NASAL SPRAY    Place 1 spray into both nostrils daily as needed (seasonal allergies).   LISINOPRIL (ZESTRIL) 20 MG TABLET    TAKE 1 TABLET EVERY DAY   OVER THE COUNTER MEDICATION    Apply 1 application topically See admin instructions. CBD Recovery [place] (3oz/88m) use as needed for back pain   VITAMIN B-12 (CYANOCOBALAMIN) 1000 MCG TABLET    Take 1 tablet (1,000 mcg total) by mouth daily.  Modified Medications   No medications on file  Discontinued  Medications   No medications on file    Physical Exam:  Vitals:   11/08/20 1419  BP: (!) 142/70  Pulse: 91  Temp: (!) 97.1 F (36.2 C)  TempSrc: Temporal  SpO2: 99%  Weight: 100 lb 6.4 oz (45.5 kg)  Height: '4\' 11"'$  (1.499 m)   Body mass index is 20.28 kg/m. Wt Readings from Last 3 Encounters:  11/08/20 100 lb 6.4 oz (45.5 kg)  08/07/20 101 lb 3.2 oz (45.9 kg)  04/19/20 104 lb (47.2 kg)    Physical Exam Constitutional:      General: She is not in acute distress.    Appearance: She is well-developed. She is not diaphoretic.  HENT:     Head: Normocephalic and atraumatic.     Mouth/Throat:     Pharynx: No oropharyngeal exudate.  Eyes:     Conjunctiva/sclera: Conjunctivae normal.     Pupils: Pupils are equal, round, and reactive to light.  Cardiovascular:     Rate and Rhythm: Normal rate and regular rhythm.     Heart sounds: Normal heart sounds.  Pulmonary:     Effort: Pulmonary effort is normal.     Breath sounds: Normal breath sounds.  Abdominal:     General: Bowel sounds are normal.     Palpations: Abdomen is soft.  Musculoskeletal:     Cervical back: Normal range of motion and neck supple.     Right lower leg: No edema.     Left lower leg: No edema.  Skin:    General: Skin is warm and dry.  Neurological:     Mental Status: She is alert. Mental status is at baseline.     Gait: Gait abnormal (uses cane).  Psychiatric:        Mood and Affect: Mood normal.    Labs reviewed: Basic Metabolic Panel: Recent Labs    04/16/20 0206 04/19/20 0000 08/07/20 1223  NA 132* 133* 139  K 4.3 4.7 4.8  CL 100 100 105  CO2 '24 25 22  '$ GLUCOSE 111* 82 88  BUN 28* 26* 22  CREATININE 1.22* 1.08* 0.96*  CALCIUM 8.9 9.4 9.2  TSH  --   --  2.03   Liver Function Tests: Recent Labs    03/04/20 1211 04/15/20 1351 08/07/20 1223  AST '27 30 25  '$ ALT 24 34 25  ALKPHOS  --  77  --   BILITOT 0.6 0.9 0.8  PROT 6.6 6.6 5.9*  ALBUMIN  --  4.1  --    No results for input(s):  LIPASE, AMYLASE in the last 8760 hours. No results for input(s): AMMONIA in the last 8760 hours. CBC: Recent Labs    08/07/20 1223 08/13/20 0817 08/23/20 1143  WBC 9.8 10.3 11.5*  NEUTROABS 6,948 7,653 8,464*  HGB 7.8* 7.5* 8.2*  HCT 25.5* 25.0* 27.9*  MCV 108.5* 106.8* 107.3*  PLT 112* 148 145   Lipid Panel: Recent Labs    04/16/20 0446  CHOL 105  HDL 49  LDLCALC 43  TRIG 66  CHOLHDL 2.1   TSH: Recent Labs    08/07/20 1223  TSH 2.03   A1C: Lab Results  Component Value Date   HGBA1C 5.5 04/16/2020     Assessment/Plan 1. Anemia, unspecified type -noted on last labs, no signs of acute blood loss. Will follow up - CBC with Differential/Platelet  2. Vitamin B12 deficiency -continue on supplement.   3. History of TIA (transient ischemic attack) Continues on plavix, no recent events  4. Memory loss -stable, continues to have on and off days. Daughter is helping her with appts. Will continue to monitor.   5. Essential hypertension Goal bp <140/90.   6. Seizure ([place]) -no recent seizures, referral has been made to neurology, daughter will call to set up appt.    Next appt: 4 months.  Carlos American. [place], [facility] [phone]

## 2020-11-09 LAB — CBC WITH DIFFERENTIAL/PLATELET
Absolute Monocytes: 902 cells/uL (ref 200–950)
Basophils Absolute: 129 cells/uL (ref 0–200)
Basophils Relative: 1.4 %
Eosinophils Absolute: 294 cells/uL (ref 15–500)
Eosinophils Relative: 3.2 %
HCT: 26.3 % — ABNORMAL LOW (ref 35.0–45.0)
Hemoglobin: 8.2 g/dL — ABNORMAL LOW (ref 11.7–15.5)
Lymphs Abs: 1628 cells/uL (ref 850–3900)
MCH: 32.9 pg (ref 27.0–33.0)
MCHC: 31.2 g/dL — ABNORMAL LOW (ref 32.0–36.0)
MCV: 105.6 fL — ABNORMAL HIGH (ref 80.0–100.0)
Monocytes Relative: 9.8 %
Neutro Abs: 6247 cells/uL (ref 1500–7800)
Neutrophils Relative %: 67.9 %
Platelets: 116 10*3/uL — ABNORMAL LOW (ref 140–400)
RBC: 2.49 10*6/uL — ABNORMAL LOW (ref 3.80–5.10)
RDW: 19.8 % — ABNORMAL HIGH (ref 11.0–15.0)
Total Lymphocyte: 17.7 %
WBC: 9.2 10*3/uL (ref 3.8–10.8)

## 2020-11-26 ENCOUNTER — Telehealth: Payer: Self-pay

## 2020-11-26 ENCOUNTER — Ambulatory Visit (INDEPENDENT_AMBULATORY_CARE_PROVIDER_SITE_OTHER): Payer: Medicare HMO | Admitting: Nurse Practitioner

## 2020-11-26 ENCOUNTER — Encounter: Payer: Self-pay | Admitting: Nurse Practitioner

## 2020-11-26 ENCOUNTER — Other Ambulatory Visit: Payer: Self-pay

## 2020-11-26 DIAGNOSIS — Z0000 Encounter for general adult medical examination without abnormal findings: Secondary | ICD-10-CM | POA: Diagnosis not present

## 2020-11-26 NOTE — Patient Instructions (Addendum)
Sarah Hampton , Thank you for taking time to come for your Medicare Wellness Visit. I appreciate your ongoing commitment to your health goals. Please review the following plan we discussed and let me know if I can assist you in the future.   Screening recommendations/referrals: Colonoscopy aged out Mammogram aged out Bone Density up to date Recommended yearly ophthalmology/optometry visit for glaucoma screening and checkup Recommended yearly dental visit for hygiene and checkup  Vaccinations: Influenza vaccine recommended to get annually- due at this time.  Pneumococcal vaccine up to date Tdap vaccine - please send records of TDap Shingles vaccine RECOMMENDED- to get at local pharmacy    Advanced directives: on file  Conditions/risks identified: hearing loss, advance age, hx of TIA  Next appointment: 1 year   Preventive Care 23 Years and Older, Female Preventive care refers to lifestyle choices and visits with your health care provider that can promote health and wellness. What does preventive care include? A yearly physical exam. This is also called an annual well check. Dental exams once or twice a year. Routine eye exams. Ask your health care provider how often you should have your eyes checked. Personal lifestyle choices, including: Daily care of your teeth and gums. Regular physical activity. Eating a healthy diet. Avoiding tobacco and drug use. Limiting alcohol use. Practicing safe sex. Taking low-dose aspirin every day. Taking vitamin and mineral supplements as recommended by your health care provider. What happens during an annual well check? The services and screenings done by your health care provider during your annual well check will depend on your age, overall health, lifestyle risk factors, and family history of disease. Counseling  Your health care provider may ask you questions about your: Alcohol use. Tobacco use. Drug use. Emotional well-being. Home and  relationship well-being. Sexual activity. Eating habits. History of falls. Memory and ability to understand (cognition). Work and work Statistician. Reproductive health. Screening  You may have the following tests or measurements: Height, weight, and BMI. Blood pressure. Lipid and cholesterol levels. These may be checked every 5 years, or more frequently if you are over 31 years old. Skin check. Lung cancer screening. You may have this screening every year starting at age 73 if you have a 30-pack-year history of smoking and currently smoke or have quit within the past 15 years. Fecal occult blood test (FOBT) of the stool. You may have this test every year starting at age 58. Flexible sigmoidoscopy or colonoscopy. You may have a sigmoidoscopy every 5 years or a colonoscopy every 10 years starting at age 54. Hepatitis C blood test. Hepatitis B blood test. Sexually transmitted disease (STD) testing. Diabetes screening. This is done by checking your blood sugar (glucose) after you have not eaten for a while (fasting). You may have this done every 1-3 years. Bone density scan. This is done to screen for osteoporosis. You may have this done starting at age 82. Mammogram. This may be done every 1-2 years. Talk to your health care provider about how often you should have regular mammograms. Talk with your health care provider about your test results, treatment options, and if necessary, the need for more tests. Vaccines  Your health care provider may recommend certain vaccines, such as: Influenza vaccine. This is recommended every year. Tetanus, diphtheria, and acellular pertussis (Tdap, Td) vaccine. You may need a Td booster every 10 years. Zoster vaccine. You may need this after age 1. Pneumococcal 13-valent conjugate (PCV13) vaccine. One dose is recommended after age 86. Pneumococcal polysaccharide (  PPSV23) vaccine. One dose is recommended after age 28. Talk to your health care provider  about which screenings and vaccines you need and how often you need them. This information is not intended to replace advice given to you by your health care provider. Make sure you discuss any questions you have with your health care provider. Document Released: 04/05/2015 Document Revised: 11/27/2015 Document Reviewed: 01/08/2015 Elsevier Interactive Patient Education  2017 [place] Prevention in the Home Falls can cause injuries. They can happen to people of all ages. There are many things you can do to make your home safe and to help prevent falls. What can I do on the outside of my home? Regularly fix the edges of walkways and driveways and fix any cracks. Remove anything that might make you trip as you walk through a door, such as a raised step or threshold. Trim any bushes or trees on the path to your home. Use bright outdoor lighting. Clear any walking paths of anything that might make someone trip, such as rocks or tools. Regularly check to see if handrails are loose or broken. Make sure that both sides of any steps have handrails. Any raised decks and porches should have guardrails on the edges. Have any leaves, snow, or ice cleared regularly. Use sand or salt on walking paths during winter. Clean up any spills in your garage right away. This includes oil or grease spills. What can I do in the bathroom? Use night lights. Install grab bars by the toilet and in the tub and shower. Do not use towel bars as grab bars. Use non-skid mats or decals in the tub or shower. If you need to sit down in the shower, use a plastic, non-slip stool. Keep the floor dry. Clean up any water that spills on the floor as soon as it happens. Remove soap buildup in the tub or shower regularly. Attach bath mats securely with double-sided non-slip rug tape. Do not have throw rugs and other things on the floor that can make you trip. What can I do in the bedroom? Use night lights. Make sure  that you have a light by your bed that is easy to reach. Do not use any sheets or blankets that are too big for your bed. They should not hang down onto the floor. Have a firm chair that has side arms. You can use this for support while you get dressed. Do not have throw rugs and other things on the floor that can make you trip. What can I do in the kitchen? Clean up any spills right away. Avoid walking on wet floors. Keep items that you use a lot in easy-to-reach places. If you need to reach something above you, use a strong step stool that has a grab bar. Keep electrical cords out of the way. Do not use floor polish or wax that makes floors slippery. If you must use wax, use non-skid floor wax. Do not have throw rugs and other things on the floor that can make you trip. What can I do with my stairs? Do not leave any items on the stairs. Make sure that there are handrails on both sides of the stairs and use them. Fix handrails that are broken or loose. Make sure that handrails are as long as the stairways. Check any carpeting to make sure that it is firmly attached to the stairs. Fix any carpet that is loose or worn. Avoid having throw rugs at the top or  bottom of the stairs. If you do have throw rugs, attach them to the floor with carpet tape. Make sure that you have a light switch at the top of the stairs and the bottom of the stairs. If you do not have them, ask someone to add them for you. What else can I do to help prevent falls? Wear shoes that: Do not have high heels. Have rubber bottoms. Are comfortable and fit you well. Are closed at the toe. Do not wear sandals. If you use a stepladder: Make sure that it is fully opened. Do not climb a closed stepladder. Make sure that both sides of the stepladder are locked into place. Ask someone to hold it for you, if possible. Clearly mark and make sure that you can see: Any grab bars or handrails. First and last steps. Where the edge of  each step is. Use tools that help you move around (mobility aids) if they are needed. These include: Canes. Walkers. Scooters. Crutches. Turn on the lights when you go into a dark area. Replace any light bulbs as soon as they burn out. Set up your furniture so you have a clear path. Avoid moving your furniture around. If any of your floors are uneven, fix them. If there are any pets around you, be aware of where they are. Review your medicines with your doctor. Some medicines can make you feel dizzy. This can increase your chance of falling. Ask your doctor what other things that you can do to help prevent falls. This information is not intended to replace advice given to you by your health care provider. Make sure you discuss any questions you have with your health care provider. Document Released: 01/03/2009 Document Revised: 08/15/2015 Document Reviewed: 04/13/2014 Elsevier Interactive Patient Education  2017 [employer].

## 2020-11-26 NOTE — Telephone Encounter (Signed)
Ms. lyndora, arceo are scheduled for a virtual visit with your provider today.    Just as we do with appointments in the office, we must obtain your consent to participate.  Your consent will be active for this visit and any virtual visit you may have with one of our providers in the next 365 days.    If you have a MyChart account, I can also send a copy of this consent to you electronically.  All virtual visits are billed to your insurance company just like a traditional visit in the office.  As this is a virtual visit, video technology does not allow for your provider to perform a traditional examination.  This may limit your provider's ability to fully assess your condition.  If your provider identifies any concerns that need to be evaluated in person or the need to arrange testing such as labs, EKG, etc, we will make arrangements to do so.    Although advances in technology are sophisticated, we cannot ensure that it will always work on either your end or our end.  If the connection with a video visit is poor, we may have to switch to a telephone visit.  With either a video or telephone visit, we are not always able to ensure that we have a secure connection.   I need to obtain your verbal consent now.   Are you willing to proceed with your visit today?   RIGBY GROVE has provided verbal consent on 11/26/2020 for a virtual visit (video or telephone).   Carroll Kinds, CMA 11/26/2020  9:49 AM

## 2020-11-26 NOTE — Progress Notes (Signed)
This service is provided via telemedicine  No vital signs collected/recorded due to the encounter was a telemedicine visit.   Location of patient (ex: home, work):  Home  Patient consents to a telephone visit:  Yes, see encounter dated 11/26/2020  Location of the provider (ex: office, home):  [place]  Name of any referring provider:  N/A  Names of all persons participating in the telemedicine service and their role in the encounter:  Sherrie Mustache, Nurse Practitioner, Carroll Kinds, CMA, and patient.   Time spent on call:  11 minutes with medical assistant

## 2020-11-26 NOTE — Progress Notes (Signed)
Subjective:   Sarah Hampton is a 87 y.o. female who presents for Medicare Annual (Subsequent) preventive examination.  Review of Systems     Cardiac Risk Factors include: advanced age (>36mn, >>26women);hypertension;dyslipidemia     Objective:    There were no vitals filed for this visit. There is no height or weight on file to calculate BMI.  Advanced Directives 11/26/2020 11/08/2020 08/07/2020 04/19/2020 04/15/2020 03/04/2020 08/30/2019  Does Patient Have a Medical Advance Directive? Yes Yes Yes Yes Yes Yes Yes  Type of AParamedicof [place]edar HighlandsLiving will Living will [place] will Living will Living will Living will Living will  Does patient want to make changes to medical advance directive? No - Patient declined No - Patient declined No - Patient declined No - Patient declined No - Patient declined No - Patient declined No - Patient declined  Copy of [place] Chart? Yes - validated most recent copy scanned in chart (See row information) - Yes - validated most recent copy scanned in chart (See row information) - - - -    Current Medications (verified) Outpatient Encounter Medications as of 11/26/2020  Medication Sig   atorvastatin (LIPITOR) 80 MG tablet TAKE 1 TABLET EVERY DAY  AT  6PM   Calcium Carbonate-Vitamin D 600-400 MG-UNIT tablet Take 1 tablet by mouth 2 (two) times daily.   clopidogrel (PLAVIX) 75 MG tablet TAKE 1 TABLET EVERY DAY   fluticasone (FLONASE) 50 MCG/ACT nasal spray Place 1 spray into both nostrils daily as needed (seasonal allergies).   lisinopril (ZESTRIL) 20 MG tablet TAKE 1 TABLET EVERY DAY   OVER THE COUNTER MEDICATION Apply 1 application topically See admin instructions. CBD Recovery Rollon (3oz/936m use as needed for back pain   vitamin B-12 (CYANOCOBALAMIN) 1000 MCG tablet Take 1 tablet (1,000 mcg total) by mouth daily.   No facility-administered encounter medications on file as of  11/26/2020.    Allergies (verified) Patient has no known allergies.   History: Past Medical History:  Diagnosis Date   Arthritis    History of melanoma    shoulder   History of squamous cell carcinoma    History of TIA (transient ischemic attack)    Hypertension    Macrocytic anemia    Mini stroke ([facility]   Per [facility]    Other hyperlipidemia    Personal history of malignant neoplasm of other organs and systems    [facility]   Past Surgical History:  Procedure Laterality Date   CATARACT EXTRACTION     DENTAL SURGERY     SKIN SURGERY  07/2018   SQUAMOUS CELL CARCINOMA EXCISION     TONSILLECTOMY  1946   Per [facility]    Family History  Problem Relation Age of Onset   Dementia Mother 982 Breast cancer Mother    Pneumonia Father 9327 Cancer Father    Thyroid disease Daughter    Thyroid disease Daughter    Stroke Neg Hx    Social History   Socioeconomic History   Marital status: Widowed    Spouse name: Not on file   Number of children: Not on file   Years of education: Not on file   Highest education level: Not on file  Occupational History   Occupation: retired  Tobacco Use   Smoking status: Former    Packs/day: 0.50    Years: 30.00    Pack  years: 15.00    Types: Cigarettes    Quit date: 09/28/1968    Years since quitting: 52.1   Smokeless tobacco: Never  Vaping Use   Vaping Use: Never used  Substance and Sexual Activity   Alcohol use: Not Currently    Comment: rare- 1 glass of wine   Drug use: Never   Sexual activity: Not on file  Other Topics Concern   Not on file  Social History Narrative   Per [place] Patient Packet 01/13/2019       Diet: No answer       Caffeine: Coffee       Married, if yes what year: Widowed, married in [place] you live in a house, apartment, assisted living, condo, trailer, ect: One stories, one person (apartment)       Pets: No      Current/Past profession: Museum/gallery conservator        Exercise:Yes, 5-6 x weekly          Living Will: Yes   DNR: Yes   POA/HPOA: Yes      Functional Status:   Do you have difficulty bathing or dressing yourself?No   Do you have difficulty preparing food or eating? No   Do you have difficulty managing your medications? No   Do you have difficulty managing your finances? No   Do you have difficulty affording your medications? No   Social Determinants of Radio broadcast assistant Strain: Not on file  Food Insecurity: Not on file  Transportation Needs: Not on file  Physical Activity: Not on file  Stress: Not on file  Social Connections: Not on file    Tobacco Counseling Counseling given: Not Answered   Clinical Intake:  Pre-visit preparation completed: Yes  Pain : No/denies pain     BMI - recorded: 20 Nutritional Status: BMI of 19-24  Normal  How often do you need to have someone help you when you read instructions, pamphlets, or other written materials from your doctor or pharmacy?: 1 - Never  Diabetic?no         Activities of Daily Living In your present state of health, do you have any difficulty performing the following activities: 11/26/2020 11/26/2020  Hearing? Tempie Donning  Vision? Y -  Difficulty concentrating or making decisions? N -  Walking or climbing stairs? N -  Dressing or bathing? N -  Doing errands, shopping? Y -  Comment does not drive -  Preparing Food and eating ? N -  Using the Toilet? N -  In the past six months, have you accidently leaked urine? Y -  Do you have problems with loss of bowel control? Y -  Managing your Medications? N -  Managing your Finances? N -  Housekeeping or managing your Housekeeping? N -  Some recent data might be hidden    Patient Care Team: Lauree Chandler, NP as PCP - General (Geriatric Medicine) Ngetich, Nelda Bucks, NP as Nurse Practitioner (Family Medicine)  Indicate any recent Medical Services you may have received from other than [facility] providers in the  past year (date may be approximate).     Assessment:   This is a routine wellness examination for Najwa.  Hearing/Vision screen Hearing Screening - Comments:: Patient wears hearing aids Vision Screening - Comments:: Patient wears reading glasses.Patient had last eye exam about a year ago. Patient goes to [facility]  Dietary issues and exercise activities discussed: Current  Exercise Habits: Structured exercise class, Type of exercise: stretching;calisthenics, Time (Minutes): 45, Frequency (Times/Week): 2, Weekly Exercise (Minutes/Week): 90   Goals Addressed   None    Depression Screen PHQ 2/9 Scores 11/26/2020 03/04/2020 02/24/2019 01/13/2019  PHQ - 2 Score 0 0 0 0    Fall Risk Fall Risk  11/26/2020 11/08/2020 04/15/2020 03/04/2020 06/15/2019  Falls in the past year? 0 1 0 0 0  Number falls in past yr: 0 0 0 0 0  Injury with Fall? 0 0 0 0 0  Risk for fall due to : No Fall Risks No Fall Risks - - -  Follow up Falls evaluation completed Falls evaluation completed - - -    FALL RISK PREVENTION PERTAINING TO THE HOME:  Any stairs in or around the home? No  If so, are there any without handrails? No  Home free of loose throw rugs in walkways, pet beds, electrical cords, etc? Yes  Adequate lighting in your home to reduce risk of falls? Yes   ASSISTIVE DEVICES UTILIZED TO PREVENT FALLS:  Life alert? Yes  Use of a cane, walker or w/c? Yes  Grab bars in the bathroom? Yes  Shower chair or bench in shower? No  Elevated toilet seat or a handicapped toilet? No   TIMED UP AND GO:  Was the test performed? No .    Cognitive Function: MMSE - Mini Mental State Exam 02/24/2019  Orientation to time 5  Orientation to Place 5  Registration 3  Attention/ Calculation 5  Recall 2  Language- name 2 objects 2  Language- repeat 1  Language- follow 3 step command 3  Language- read & follow direction 1  Write a sentence 1  Copy design 0  Total score 28     6CIT Screen 11/26/2020  What  Year? 0 points  What month? 0 points  What time? 0 points  Count back from 20 0 points  Months in reverse 4 points  Repeat phrase 6 points  Total Score 10    Immunizations Immunization History  Administered Date(s) Administered   Hep A / Hep B 02/15/2012, 03/17/2012, 08/16/2012   Influenza Split 02/12/2009, 01/09/2010   Influenza, High Dose Seasonal PF 01/28/2012, 02/10/2013, 01/23/2014, 02/07/2015, 12/18/2018, 01/30/2020   Influenza, Quadrivalent, Recombinant, Inj, Pf 01/10/2018   Influenza, Seasonal, Injecte, Preservative Fre 12/23/2010   Influenza,inj,Quad PF,6+ Mos 01/11/2017   Moderna Sars-Covid-2 Vaccination 04/24/2019, 05/22/2019, 01/30/2020   Pneumococcal Conjugate-13 01/23/2014   Pneumococcal Polysaccharide-23 01/27/2007   Typhoid Inactivated 02/15/2012    TDAP status: Up to date  Flu Vaccine status: Due, Education has been provided regarding the importance of this vaccine. Advised may receive this vaccine at local pharmacy or Health Dept. Aware to provide a copy of the vaccination record if obtained from local pharmacy or Health Dept. Verbalized acceptance and understanding.  Pneumococcal vaccine status: Up to date  Covid-19 vaccine status: Information provided on how to obtain vaccines.   Qualifies for Shingles Vaccine? Yes   Zostavax completed No   Shingrix Completed?: No.    Education has been provided regarding the importance of this vaccine. Patient has been advised to call insurance company to determine out of pocket expense if they have not yet received this vaccine. Advised may also receive vaccine at local pharmacy or Health Dept. Verbalized acceptance and understanding.  Screening Tests Health Maintenance  Topic Date Due   TETANUS/TDAP  Never done   Zoster Vaccines- Shingrix (1 of 2) Never done   COVID-19 Vaccine (4 -  Booster for Moderna series) 05/01/2020   INFLUENZA VACCINE  10/21/2020   DEXA SCAN  Completed   PNA vac Low Risk Adult  Completed   HPV  VACCINES  Aged Out    Health Maintenance  Health Maintenance Due  Topic Date Due   TETANUS/TDAP  Never done   Zoster Vaccines- Shingrix (1 of 2) Never done   COVID-19 Vaccine (4 - Booster for Moderna series) 05/01/2020   INFLUENZA VACCINE  10/21/2020    Colorectal cancer screening: No longer required.   Mammogram status: No longer required due to age.  Bone Density status: Completed 6/22. Results reflect: Bone density results: OSTEOPENIA. Repeat every 2 years.  Lung Cancer Screening: (Low Dose CT Chest recommended if Age 72-80 years, 30 pack-year currently smoking OR have quit w/in 15years.) does not qualify.   Lung Cancer Screening Referral: na  Additional Screening:  Hepatitis C Screening: does not qualify  Vision Screening: Recommended annual ophthalmology exams for early detection of glaucoma and other disorders of the eye. Is the patient up to date with their annual eye exam?  Yes  Who is the provider or what is the name of the office in which the patient attends annual eye exams? [facility] If pt is not established with a provider, would they like to be referred to a provider to establish care? No .   Dental Screening: Recommended annual dental exams for proper [place] Resource Referral / Chronic Care Management: CRR required this visit?  No   CCM required this visit?  No      Plan:     I have personally reviewed and noted the following in the patient's chart:   Medical and social history Use of alcohol, tobacco or illicit drugs  Current medications and supplements including opioid prescriptions.  Functional ability and status Nutritional status Physical activity Advanced directives List of other physicians Hospitalizations, surgeries, and ER visits in previous 12 months Vitals Screenings to include cognitive, depression, and falls Referrals and appointments  In addition, I have reviewed and discussed with patient certain preventive  protocols, quality metrics, and best practice recommendations. A written personalized care plan for preventive services as well as general preventive health recommendations were provided to patient.     Lauree Chandler, NP   11/26/2020    Virtual Visit via Telephone Note  I connected withNAME@ on 11/26/20 at  9:00 AM EDT by telephone and verified that I am speaking with the correct person using two identifiers.  Location: Patient: home Provider: [facility]   I discussed the limitations, risks, security and privacy concerns of performing an evaluation and management service by telephone and the availability of in person appointments. I also discussed with the patient that there may be a patient responsible charge related to this service. The patient expressed understanding and agreed to proceed.   I discussed the assessment and treatment plan with the patient. The patient was provided an opportunity to ask questions and all were answered. The patient agreed with the plan and demonstrated an understanding of the instructions.   The patient was advised to call back or seek an in-person evaluation if the symptoms worsen or if the condition fails to improve as anticipated.  I provided 18 minutes of non-face-to-face time during this encounter.  Carlos American. Harle Battiest Avs printed and mailed

## 2020-12-04 ENCOUNTER — Encounter: Payer: Self-pay | Admitting: Family

## 2020-12-04 ENCOUNTER — Ambulatory Visit (INDEPENDENT_AMBULATORY_CARE_PROVIDER_SITE_OTHER): Payer: Medicare HMO | Admitting: Family

## 2020-12-04 ENCOUNTER — Other Ambulatory Visit: Payer: Self-pay

## 2020-12-04 VITALS — BP 140/82 | HR 75 | Temp 96.9°F | Ht 59.0 in | Wt 99.0 lb

## 2020-12-04 DIAGNOSIS — S51011A Laceration without foreign body of right elbow, initial encounter: Secondary | ICD-10-CM | POA: Diagnosis not present

## 2020-12-04 DIAGNOSIS — W19XXXA Unspecified fall, initial encounter: Secondary | ICD-10-CM

## 2020-12-04 DIAGNOSIS — R2681 Unsteadiness on feet: Secondary | ICD-10-CM

## 2020-12-04 DIAGNOSIS — M1711 Unilateral primary osteoarthritis, right knee: Secondary | ICD-10-CM

## 2020-12-04 NOTE — Progress Notes (Signed)
Provider: Hadia Minier FNP-C  Lauree Chandler, NP  Patient Care Team: Lauree Chandler, NP as PCP - General (Geriatric Medicine) Charod Slawinski, Nelda Bucks, NP as Nurse Practitioner (Family Medicine)  Extended Emergency Contact Information Primary Emergency Contact: Melynda Ripple) Mobile Phone: [phone] Relation: Daughter Preferred language: English  Code Status:  DNR Goals of care: Advanced Directive information Advanced Directives 11/26/2020  Does Patient Have a Medical Advance Directive? Yes  Type of Paramedic of [place];Living will  Does patient want to make changes to medical advance directive? No - Patient declined  Copy of [place] in Chart? Yes - validated most recent copy scanned in chart (See row information)     Chief Complaint  Patient presents with   Acute Visit    Patient fell last night and c/o soreness on right side. Moderate fall risk.  Patient with a lack of awareness at times. Patient lives alone. Patients daughter is concerned that patient is not eating enough and they have discuss the possibility of assisted living. Here with daughter, [name].     HPI:  Pt is a 87 y.o. female seen today for an acute visit for evaluation of Fall last night.she is here with her daughter [name]. Fell on right side while taking a dish to the kitchen.denies hitting her head or loss of consciousness. Denies any denies any headache,dizziness,vision changes,fatigue,chest tightness,palpitation,chest pain or shortness of breath. Also denies any S/Sx of URI.  She was able to scoot herself on the floor and call the daughter.  Daughter is concerned that patient lives alone and not eating well.Has discussed transfer into an Assisted Living.Discussed with patient need to transfer to higher level of care due to her high risk for falls.Daughter already has few choices in mind which she will identify then will notify provider to fill FL 2  form whenever there's bed availability.     Past Medical History:  Diagnosis Date   Arthritis    History of melanoma    shoulder   History of squamous cell carcinoma    History of TIA (transient ischemic attack)    Hypertension    Macrocytic anemia    Mini stroke ([place])    Per [facility] New Patient Packet    Other hyperlipidemia    Personal history of malignant neoplasm of other organs and systems    Seizure [facility])    Past Surgical History:  Procedure Laterality Date   CATARACT EXTRACTION     DENTAL SURGERY     SKIN SURGERY  07/2018   SQUAMOUS CELL CARCINOMA EXCISION     TONSILLECTOMY  1946   Per [facility] New Patient Packet     No Known Allergies  Outpatient Encounter Medications as of 12/04/2020  Medication Sig   atorvastatin (LIPITOR) 80 MG tablet TAKE 1 TABLET EVERY DAY  AT  6PM   Calcium Carbonate-Vitamin D 600-400 MG-UNIT tablet Take 1 tablet by mouth 2 (two) times daily.   clopidogrel (PLAVIX) 75 MG tablet TAKE 1 TABLET EVERY DAY   fluticasone (FLONASE) 50 MCG/ACT nasal spray Place 1 spray into both nostrils daily as needed (seasonal allergies).   lisinopril (ZESTRIL) 20 MG tablet TAKE 1 TABLET EVERY DAY   OVER THE COUNTER MEDICATION Apply 1 application topically See admin instructions. CBD Recovery Rollon (3oz/34m) use as needed for back pain   vitamin B-12 (CYANOCOBALAMIN) 1000 MCG tablet Take 1 tablet (1,000 mcg total) by mouth daily.   No facility-administered encounter medications on file  as of 12/04/2020.    Review of Systems  Constitutional:  Negative for appetite change, chills, fatigue, fever and unexpected weight change.  HENT:  Negative for congestion, dental problem, ear discharge, ear pain, facial swelling, hearing loss, nosebleeds, postnasal drip, rhinorrhea, sinus pressure, sinus pain, sneezing, sore throat, tinnitus and trouble swallowing.   Eyes:  Negative for pain, discharge, redness, itching and visual disturbance.  Respiratory:  Negative for cough, chest  tightness, shortness of breath and wheezing.   Cardiovascular:  Negative for chest pain, palpitations and leg swelling.  Gastrointestinal:  Negative for abdominal distention, abdominal pain, blood in stool, constipation, diarrhea, nausea and vomiting.  Endocrine: Negative for cold intolerance, heat intolerance, polydipsia, polyphagia and polyuria.  Genitourinary:  Negative for difficulty urinating, dysuria, flank pain, frequency and urgency.  Musculoskeletal:  Positive for arthralgias and gait problem. Negative for back pain, joint swelling, myalgias, neck pain and neck stiffness.       Right knee arthritic pain   Skin:  Negative for color change, pallor, rash and wound.  Neurological:  Negative for dizziness, syncope, speech difficulty, weakness, light-headedness, numbness and headaches.  Hematological:  Does not bruise/bleed easily.  Psychiatric/Behavioral:  Negative for agitation, behavioral problems, confusion, hallucinations, self-injury, sleep disturbance and suicidal ideas. The patient is not nervous/anxious.    Immunization History  Administered Date(s) Administered   Hep A / Hep B 02/15/2012, 03/17/2012, 08/16/2012   Influenza Split 02/12/2009, 01/09/2010   Influenza, High Dose Seasonal PF 01/28/2012, 02/10/2013, 01/23/2014, 02/07/2015, 12/18/2018, 01/30/2020   Influenza, Quadrivalent, Recombinant, Inj, Pf 01/10/2018   Influenza, Seasonal, Injecte, Preservative Fre 12/23/2010   Influenza,inj,Quad PF,6+ Mos 01/11/2017   Moderna Sars-Covid-2 Vaccination 04/24/2019, 05/22/2019, 01/30/2020   Pneumococcal Conjugate-13 01/23/2014   Pneumococcal Polysaccharide-23 01/27/2007   Typhoid Inactivated 02/15/2012   Pertinent  Health Maintenance Due  Topic Date Due   INFLUENZA VACCINE  10/21/2020   DEXA SCAN  Completed   PNA vac Low Risk Adult  Completed   Fall Risk  12/04/2020 11/26/2020 11/08/2020 04/15/2020 03/04/2020  Falls in the past year? 1 0 1 0 0  Number falls in past yr: 1 0 0 0 0   Injury with Fall? 0 0 0 0 0  Risk for fall due to : History of fall(s);Impaired balance/gait;Impaired mobility No Fall Risks No Fall Risks - -  Follow up Falls evaluation completed Falls evaluation completed Falls evaluation completed - -   Functional Status Survey:    Vitals:   12/04/20 1138  BP: 140/82  Pulse: 75  Temp: (!) 96.9 F (36.1 C)  TempSrc: Temporal  SpO2: 99%  Weight: 99 lb (44.9 kg)  Height: 4' 11"  (1.499 m)   Body mass index is 20 kg/m. Physical Exam Vitals reviewed.  Constitutional:      General: She is not in acute distress.    Appearance: Normal appearance. She is normal weight. She is not ill-appearing or diaphoretic.  HENT:     Head: Normocephalic.     Right Ear: Tympanic membrane, ear canal and external ear normal. There is no impacted cerumen.     Left Ear: Tympanic membrane, ear canal and external ear normal. There is no impacted cerumen.     Nose: Nose normal. No congestion or rhinorrhea.     Mouth/Throat:     Mouth: Mucous membranes are moist.     Pharynx: Oropharynx is clear. No oropharyngeal exudate or posterior oropharyngeal erythema.  Eyes:     General: No scleral icterus.       Right  eye: No discharge.        Left eye: No discharge.     Extraocular Movements: Extraocular movements intact.     Conjunctiva/sclera: Conjunctivae normal.     Pupils: Pupils are equal, round, and reactive to light.  Neck:     Vascular: No carotid bruit.  Cardiovascular:     Rate and Rhythm: Normal rate and regular rhythm.     Pulses: Normal pulses.     Heart sounds: Normal heart sounds. No murmur heard.   No friction rub. No gallop.  Pulmonary:     Effort: Pulmonary effort is normal. No respiratory distress.     Breath sounds: Normal breath sounds. No wheezing, rhonchi or rales.  Chest:     Chest wall: No tenderness.  Abdominal:     General: Bowel sounds are normal. There is no distension.     Palpations: Abdomen is soft. There is no mass.      Tenderness: There is no abdominal tenderness. There is no right CVA tenderness, left CVA tenderness, guarding or rebound.  Musculoskeletal:        General: No swelling or tenderness. Normal range of motion.     Cervical back: Normal range of motion. No rigidity or tenderness.     Right lower leg: No edema.     Left lower leg: No edema.     Comments: Unsteady gait   Lymphadenopathy:     Cervical: No cervical adenopathy.  Skin:    General: Skin is warm and dry.     Coloration: Skin is not pale.     Findings: No bruising, erythema, lesion or rash.     Comments: Right skin tear wound bed red without any skin flap.No signs of infection.Cleansed with saline,pat dry and apply dry TAB and covered with foam dressing for extra protection  Advised daughter to Cleanse right elbow skin tear with saline,pat dry apply small amount of TAB and cover with dry bandage or foam dressing for extra protection.  Neurological:     Mental Status: She is alert and oriented to person, place, and time.     Cranial Nerves: No cranial nerve deficit.     Sensory: No sensory deficit.     Motor: No weakness.     Coordination: Coordination normal.     Gait: Gait abnormal.  Psychiatric:        Mood and Affect: Mood normal.        Speech: Speech normal.        Behavior: Behavior normal.        Thought Content: Thought content normal.        Judgment: Judgment normal.    Labs reviewed: Recent Labs    04/16/20 0206 04/19/20 0000 08/07/20 1223  NA 132* 133* 139  K 4.3 4.7 4.8  CL 100 100 105  CO2 24 25 22   GLUCOSE 111* 82 88  BUN 28* 26* 22  CREATININE 1.22* 1.08* 0.96*  CALCIUM 8.9 9.4 9.2   Recent Labs    03/04/20 1211 04/15/20 1351 08/07/20 1223  AST 27 30 25   ALT 24 34 25  ALKPHOS  --  77  --   BILITOT 0.6 0.9 0.8  PROT 6.6 6.6 5.9*  ALBUMIN  --  4.1  --    Recent Labs    08/13/20 0817 08/23/20 1143 11/08/20 1458  WBC 10.3 11.5* 9.2  NEUTROABS 7,653 8,464* 6,247  HGB 7.5* 8.2* 8.2*  HCT  25.0* 27.9* 26.3*  MCV 106.8* 107.3* 105.6*  PLT 148 145 116*   Lab Results  Component Value Date   TSH 2.03 08/07/2020   Lab Results  Component Value Date   HGBA1C 5.5 04/16/2020   Lab Results  Component Value Date   CHOL 105 04/16/2020   HDL 49 04/16/2020   LDLCALC 43 04/16/2020   TRIG 66 04/16/2020   CHOLHDL 2.1 04/16/2020    Significant Diagnostic Results in last 30 days:  No results found.  Assessment/Plan 1. Fall, initial encounter Status post fall last night. She remains high risk for falls due to her unsteady gait  Will rule out other acute and metabolic causes  - CBC with Differential/Platelet - BMP with eGFR([facility])  2. Unsteady gait Fall and safety precaution  Daughter will identify ALF to transfer to high level of care then will notify provider to fill out FL 2 form for transfer   - Ambulatory referral to [facility]  for Physical therapy for ROM,exercise,gait stability and muscle strengthening - CBC with Differential/Platelet - BMP with eGFR([facility])  3. Primary osteoarthritis of right knee Could be a contributory to her falls. - Ambulatory referral to Home Health Physical Therapy as above   4. Skin tear of elbow without complication, right, initial encounter Wound bed red without any skin flap.Cleansed with saline,pat dry and apply dry TAB and covered with foam dressing for extra protection  Advised daughter to Cleanse right elbow skin tear with saline,pat dry apply small amount of TAB and cover with dry bandage or foam dressing for extra protection.change dressing every 3 days until healed.   Family/ staff Communication: Reviewed plan of care with patient and daughter verbalized understanding   Labs/tests ordered:   - CBC with Differential/Platelet - BMP with eGFR([facility])  Next Appointment:   Sandrea Hughs, NP

## 2020-12-04 NOTE — Patient Instructions (Signed)
Cleanse right elbow skin tear with saline,pat dry and apply dry bandage or foam dressing for extra protection

## 2020-12-05 LAB — BASIC METABOLIC PANEL WITH GFR
BUN/Creatinine Ratio: 19 (calc) (ref 6–22)
BUN: 21 mg/dL (ref 7–25)
CO2: 26 mmol/L (ref 20–32)
Calcium: 9.8 mg/dL (ref 8.6–10.4)
Chloride: 97 mmol/L — ABNORMAL LOW (ref 98–110)
Creat: 1.09 mg/dL — ABNORMAL HIGH (ref 0.60–0.95)
Glucose, Bld: 87 mg/dL (ref 65–139)
Potassium: 4.9 mmol/L (ref 3.5–5.3)
Sodium: 133 mmol/L — ABNORMAL LOW (ref 135–146)
eGFR: 49 mL/min/{1.73_m2} — ABNORMAL LOW (ref >=60)

## 2020-12-05 LAB — CBC WITH DIFFERENTIAL/PLATELET
Absolute Monocytes: 1091 cells/uL — ABNORMAL HIGH (ref 200–950)
Basophils Absolute: 124 cells/uL (ref 0–200)
Basophils Relative: 1 %
Eosinophils Absolute: 223 cells/uL (ref 15–500)
Eosinophils Relative: 1.8 %
HCT: 29 % — ABNORMAL LOW (ref 35.0–45.0)
Hemoglobin: 8.7 g/dL — ABNORMAL LOW (ref 11.7–15.5)
Lymphs Abs: 2579 cells/uL (ref 850–3900)
MCH: 32.1 pg (ref 27.0–33.0)
MCHC: 30 g/dL — ABNORMAL LOW (ref 32.0–36.0)
MCV: 107 fL — ABNORMAL HIGH (ref 80.0–100.0)
Monocytes Relative: 8.8 %
Neutro Abs: 8382 cells/uL — ABNORMAL HIGH (ref 1500–7800)
Neutrophils Relative %: 67.6 %
Platelets: 120 10*3/uL — ABNORMAL LOW (ref 140–400)
RBC: 2.71 10*6/uL — ABNORMAL LOW (ref 3.80–5.10)
RDW: 19.8 % — ABNORMAL HIGH (ref 11.0–15.0)
Total Lymphocyte: 20.8 %
WBC: 12.4 10*3/uL — ABNORMAL HIGH (ref 3.8–10.8)

## 2020-12-06 ENCOUNTER — Other Ambulatory Visit: Payer: Self-pay

## 2020-12-06 DIAGNOSIS — D649 Anemia, unspecified: Secondary | ICD-10-CM

## 2020-12-09 ENCOUNTER — Encounter: Payer: Self-pay | Admitting: Nurse Practitioner

## 2020-12-09 ENCOUNTER — Other Ambulatory Visit: Payer: Self-pay

## 2020-12-09 ENCOUNTER — Ambulatory Visit (INDEPENDENT_AMBULATORY_CARE_PROVIDER_SITE_OTHER): Payer: Medicare HMO | Admitting: Nurse Practitioner

## 2020-12-09 ENCOUNTER — Telehealth: Payer: Self-pay | Admitting: Nurse Practitioner

## 2020-12-09 VITALS — BP 120/60 | HR 69 | Temp 96.6°F | Resp 16 | Ht 59.0 in | Wt 101.6 lb

## 2020-12-09 DIAGNOSIS — Z751 Person awaiting admission to adequate facility elsewhere: Secondary | ICD-10-CM

## 2020-12-09 DIAGNOSIS — F0150 Vascular dementia without behavioral disturbance: Secondary | ICD-10-CM | POA: Diagnosis not present

## 2020-12-09 DIAGNOSIS — Z7189 Other specified counseling: Secondary | ICD-10-CM

## 2020-12-09 DIAGNOSIS — L608 Other nail disorders: Secondary | ICD-10-CM

## 2020-12-09 DIAGNOSIS — D72829 Elevated white blood cell count, unspecified: Secondary | ICD-10-CM

## 2020-12-09 DIAGNOSIS — Z111 Encounter for screening for respiratory tuberculosis: Secondary | ICD-10-CM

## 2020-12-09 DIAGNOSIS — Z8673 Personal history of transient ischemic attack (TIA), and cerebral infarction without residual deficits: Secondary | ICD-10-CM

## 2020-12-09 DIAGNOSIS — R2681 Unsteadiness on feet: Secondary | ICD-10-CM

## 2020-12-09 NOTE — Telephone Encounter (Signed)
Patients daughter wanted to know if you thought she would be able to administer her own medication with a Sat-Sun med box it has a morning and evening box. The daughter is gonna watch her do it for the next couple of days and see how does. She just wanted your opinion if you thought she would be find doing her own medication.  CALL BACK # [phone]

## 2020-12-09 NOTE — Progress Notes (Signed)
Careteam: Patient Care Team: Lauree Chandler, NP as PCP - General (Geriatric Medicine) Ngetich, Nelda Bucks, NP as Nurse Practitioner (Family Medicine)  PLACE OF SERVICE:  [place] Directive information Does Patient Have a Medical Advance Directive?: Yes, Type of Advance Directive: Healthcare Power of [place];Living will, Does patient want to make changes to medical advance directive?: No - Patient declined  No Known Allergies  Chief Complaint  Patient presents with   Form Completion    Patient is here for FL2 from completion.      HPI: Patient is a 87 y.o. female  Needs 24/7 care. Daughter agrees.  Feels like she may have had another TIA or several over the weekend.  Having trouble dressing and feeing herself.  Dropping food when eating Can get undressed but has trouble dressing.   Having a harder time following direction and understanding.   No signs of infection.   Review of Systems:  Review of Systems  Constitutional:  Negative for chills, fever and weight loss.  HENT:  Negative for tinnitus.   Respiratory:  Negative for cough, sputum production and shortness of breath.   Cardiovascular:  Negative for chest pain, palpitations and leg swelling.  Gastrointestinal:  Negative for abdominal pain, constipation, diarrhea and heartburn.  Genitourinary:  Negative for dysuria, frequency and urgency.  Musculoskeletal:  Negative for back pain, falls, joint pain and myalgias.  Skin: Negative.   Neurological:  Negative for dizziness and headaches.  Psychiatric/Behavioral:  Positive for memory loss. Negative for depression. The patient does not have insomnia.    Past Medical History:  Diagnosis Date   Arthritis    History of melanoma    shoulder   History of squamous cell carcinoma    History of TIA (transient ischemic attack)    Hypertension    Macrocytic anemia    Mini stroke ([place])    Per [facility] New Patient Packet    Other hyperlipidemia    Personal  history of malignant neoplasm of other organs and systems    Seizure [facility])    Past Surgical History:  Procedure Laterality Date   CATARACT EXTRACTION     DENTAL SURGERY     SKIN SURGERY  07/2018   SQUAMOUS CELL CARCINOMA EXCISION     TONSILLECTOMY  1946   Per [facility] New Patient Packet    Social History:   reports that she quit smoking about 52 years ago. Her smoking use included cigarettes. She has a 15.00 pack-year smoking history. She has never used smokeless tobacco. She reports that she does not currently use alcohol. She reports that she does not use drugs.  Family History  Problem Relation Age of Onset   Dementia Mother 26   Breast cancer Mother    Pneumonia Father 76   Cancer Father    Thyroid disease Daughter    Thyroid disease Daughter    Stroke Neg Hx     Medications: Patient's Medications  New Prescriptions   No medications on file  Previous Medications   ATORVASTATIN (LIPITOR) 80 MG TABLET    TAKE 1 TABLET EVERY DAY  AT  6PM   CALCIUM CARBONATE-VITAMIN D 600-400 MG-UNIT TABLET    Take 1 tablet by mouth 2 (two) times daily.   CLOPIDOGREL (PLAVIX) 75 MG TABLET    TAKE 1 TABLET EVERY DAY   FLUTICASONE (FLONASE) 50 MCG/ACT NASAL SPRAY    Place 1 spray into both nostrils daily as needed (seasonal allergies).   LISINOPRIL (ZESTRIL) 20  MG TABLET    TAKE 1 TABLET EVERY DAY   OVER THE COUNTER MEDICATION    Apply 1 application topically See admin instructions. CBD Recovery Rollon (3oz/18m) use as needed for back pain   VITAMIN B-12 (CYANOCOBALAMIN) 1000 MCG TABLET    Take 1 tablet (1,000 mcg total) by mouth daily.  Modified Medications   No medications on file  Discontinued Medications   No medications on file    Physical Exam:  Vitals:   12/09/20 1305  BP: 120/60  Pulse: 69  Resp: 16  Temp: (!) 96.6 F (35.9 C)  SpO2: 97%  Weight: 101 lb 9.6 oz (46.1 kg)  Height: '4\' 11"'$  (1.499 m)   Body mass index is 20.52 kg/m. Wt Readings from Last 3 Encounters:   12/09/20 101 lb 9.6 oz (46.1 kg)  12/04/20 99 lb (44.9 kg)  11/08/20 100 lb 6.4 oz (45.5 kg)    Physical Exam Constitutional:      General: She is not in acute distress.    Appearance: She is well-developed. She is not diaphoretic.  HENT:     Head: Normocephalic and atraumatic.     Mouth/Throat:     Pharynx: No oropharyngeal exudate.  Eyes:     Conjunctiva/sclera: Conjunctivae normal.     Pupils: Pupils are equal, round, and reactive to light.  Cardiovascular:     Rate and Rhythm: Normal rate and regular rhythm.     Heart sounds: Normal heart sounds.  Pulmonary:     Effort: Pulmonary effort is normal.     Breath sounds: Normal breath sounds.  Abdominal:     General: Bowel sounds are normal.     Palpations: Abdomen is soft.  Musculoskeletal:     Cervical back: Normal range of motion and neck supple.     Right lower leg: No edema.     Left lower leg: No edema.  Skin:    General: Skin is warm and dry.  Neurological:     Mental Status: She is alert.  Psychiatric:        Mood and Affect: Mood normal.        Cognition and Memory: Cognition is impaired. Memory is impaired.    Labs reviewed: Basic Metabolic Panel: Recent Labs    04/19/20 0000 08/07/20 1223 12/04/20 1224  NA 133* 139 133*  K 4.7 4.8 4.9  CL 100 105 97*  CO2 '25 22 26  '$ GLUCOSE 82 88 87  BUN 26* 22 21  CREATININE 1.08* 0.96* 1.09*  CALCIUM 9.4 9.2 9.8  TSH  --  2.03  --    Liver Function Tests: Recent Labs    03/04/20 1211 04/15/20 1351 08/07/20 1223  AST '27 30 25  '$ ALT 24 34 25  ALKPHOS  --  77  --   BILITOT 0.6 0.9 0.8  PROT 6.6 6.6 5.9*  ALBUMIN  --  4.1  --    No results for input(s): LIPASE, AMYLASE in the last 8760 hours. No results for input(s): AMMONIA in the last 8760 hours. CBC: Recent Labs    08/23/20 1143 11/08/20 1458 12/04/20 1224  WBC 11.5* 9.2 12.4*  NEUTROABS 8,464* 6,247 8,382*  HGB 8.2* 8.2* 8.7*  HCT 27.9* 26.3* 29.0*  MCV 107.3* 105.6* 107.0*  PLT 145 116*  120*   Lipid Panel: Recent Labs    04/16/20 0446  CHOL 105  HDL 49  LDLCALC 43  TRIG 66  CHOLHDL 2.1   TSH: Recent Labs    08/07/20 1223  TSH 2.03   A1C: Lab Results  Component Value Date   HGBA1C 5.5 04/16/2020     Assessment/Plan 1. Vascular dementia without behavioral disturbance (HCC) -progressive decline. Plan to move into assisted living for increase supervision and help with ADLs. FL2 completed.  - DNR (Do Not Resuscitate)  2. Unsteady gait Ongoing, one she has moved into facility will order for home health PT/OT  3. History of TIA (transient ischemic attack) -continues on plavix, daughter feels like she had TIAs this weekend.   4. Discoloration and thickening of nails both feet -long thick toenails.  - Ambulatory referral to Podiatry  5. Leukocytosis, unspecified type -noted on last labs. No signs of infection and educated to report any signs or symptoms of infection such as fever, sputum production, nasal discharge, wounds, pain with urination, diarrhea, etc.   6. Advance care planning -MOST form completed.    Next appt: 03/10/2021 [facility]. [place], [facility] [phone]

## 2020-12-10 ENCOUNTER — Other Ambulatory Visit: Payer: Medicare HMO

## 2020-12-10 ENCOUNTER — Ambulatory Visit (INDEPENDENT_AMBULATORY_CARE_PROVIDER_SITE_OTHER): Payer: Medicare HMO | Admitting: *Deleted

## 2020-12-10 DIAGNOSIS — Z23 Encounter for immunization: Secondary | ICD-10-CM | POA: Diagnosis not present

## 2020-12-10 NOTE — Telephone Encounter (Signed)
I think it would be good to do a trial on this to see if she was able to with a pill box.

## 2020-12-10 NOTE — Telephone Encounter (Signed)
Patient daughter notified and agreed.  

## 2020-12-10 NOTE — Telephone Encounter (Signed)
LMOM to return call.

## 2020-12-13 ENCOUNTER — Other Ambulatory Visit: Payer: Self-pay

## 2020-12-13 ENCOUNTER — Telehealth: Payer: Self-pay

## 2020-12-13 ENCOUNTER — Ambulatory Visit: Payer: Medicare HMO | Admitting: Podiatry

## 2020-12-13 VITALS — BP 129/68 | Temp 97.9°F

## 2020-12-13 DIAGNOSIS — M2042 Other hammer toe(s) (acquired), left foot: Secondary | ICD-10-CM

## 2020-12-13 DIAGNOSIS — F0150 Vascular dementia without behavioral disturbance: Secondary | ICD-10-CM

## 2020-12-13 DIAGNOSIS — M79675 Pain in left toe(s): Secondary | ICD-10-CM

## 2020-12-13 DIAGNOSIS — M2041 Other hammer toe(s) (acquired), right foot: Secondary | ICD-10-CM | POA: Diagnosis not present

## 2020-12-13 DIAGNOSIS — R2681 Unsteadiness on feet: Secondary | ICD-10-CM

## 2020-12-13 DIAGNOSIS — M79674 Pain in right toe(s): Secondary | ICD-10-CM

## 2020-12-13 DIAGNOSIS — B351 Tinea unguium: Secondary | ICD-10-CM

## 2020-12-13 LAB — QUANTIFERON-TB GOLD PLUS
Mitogen-NIL: 7.78 IU/mL
NIL: 0.02 IU/mL
QuantiFERON-TB Gold Plus: NEGATIVE
TB1-NIL: 0 IU/mL
TB2-NIL: 0 IU/mL

## 2020-12-13 NOTE — Telephone Encounter (Signed)
Faxed orders to [facility] and daughter notified.

## 2020-12-13 NOTE — Telephone Encounter (Signed)
Left voicemail for DJ to return call with agency name/fax number. Tb result up front for pickup.

## 2020-12-13 NOTE — Telephone Encounter (Signed)
If she can get their fax number or the name of the agency they use we can have an order faxed over to them.

## 2020-12-13 NOTE — Addendum Note (Signed)
Addended by: Lauree Chandler on: 12/13/2020 03:52 PM   Modules accepted: Orders

## 2020-12-13 NOTE — Telephone Encounter (Signed)
Sarah Hampton (Patient's POA) states she still hasn't heard from [facility] for PT for the patient and now she has been moved to assisted Living at [facility] and would like to pick up an order to take to them.

## 2020-12-13 NOTE — Telephone Encounter (Signed)
DJ, daughter, called and stated that we are suppose to fax TB results and Physical Therapy Orders to [facility] Attn: Maryjean Ka Fax: [phone]  Please Advise.

## 2020-12-13 NOTE — Telephone Encounter (Signed)
Quantiferon-TB gold has been resulted in labs and negative. Please fax New order placed for PT at facility.

## 2020-12-17 ENCOUNTER — Encounter: Payer: Self-pay | Admitting: Podiatry

## 2020-12-17 NOTE — Progress Notes (Signed)
Subjective: Sarah Hampton presents today referred by Sarah Chandler, NP for complaint of painful thick toenails that are difficult to trim. Duration is about one year. Pain interferes with ambulation. Aggravating factors include wearing enclosed shoe gear. She has attempted no treatment, but sought the recommendation of her PCP.  Patient's daughter, Sarah Hampton, is present during today's visit.  Past Medical History:  Diagnosis Date   Arthritis    History of melanoma    shoulder   History of squamous cell carcinoma    History of TIA (transient ischemic attack)    Hypertension    Macrocytic anemia    Mini stroke ([place])    Per [facility] New Patient Packet    Other hyperlipidemia    Personal history of malignant neoplasm of other organs and systems    Seizure [facility])      Patient Active Problem List   Diagnosis Date Noted   Macrocytic anemia 04/15/2020   Leukocytosis 04/15/2020   History of TIA (transient ischemic attack) 04/15/2020   Other hyperlipidemia 11/12/2017   TIA (transient ischemic attack) 09/28/2017   Essential hypertension 09/28/2017   CKD (chronic kidney disease), stage III ([place]) 09/28/2017   Arthritis 08/24/2017   Squamous cell carcinoma 08/24/2017   Widowed 08/24/2017   Personal history of malignant neoplasm of other organs and systems 10/16/2016     Past Surgical History:  Procedure Laterality Date   CATARACT EXTRACTION     DENTAL SURGERY     SKIN SURGERY  07/2018   SQUAMOUS CELL CARCINOMA EXCISION     TONSILLECTOMY  1946   Per [facility] New Patient Packet      Current Outpatient Medications on File Prior to Visit  Medication Sig Dispense Refill   atorvastatin (LIPITOR) 80 MG tablet TAKE 1 TABLET EVERY DAY  AT  6PM 90 tablet 1   Calcium Carbonate-Vitamin D 600-400 MG-UNIT tablet Take 1 tablet by mouth 2 (two) times daily. 60 tablet 11   clopidogrel (PLAVIX) 75 MG tablet TAKE 1 TABLET EVERY DAY 90 tablet 1   fluticasone (FLONASE) 50 MCG/ACT nasal spray Place 1 spray  into both nostrils daily as needed (seasonal allergies).     lisinopril (ZESTRIL) 20 MG tablet TAKE 1 TABLET EVERY DAY 90 tablet 1   OVER THE COUNTER MEDICATION Apply 1 application topically See admin instructions. CBD Recovery Rollon (3oz/38mL) use as needed for back pain     vitamin B-12 (CYANOCOBALAMIN) 1000 MCG tablet Take 1 tablet (1,000 mcg total) by mouth daily. 90 tablet 2   No current facility-administered medications on file prior to visit.     No Known Allergies   Social History   Occupational History   Occupation: retired  Tobacco Use   Smoking status: Former    Packs/day: 0.50    Years: 30.00    Pack years: 15.00    Types: Cigarettes    Quit date: 09/28/1968    Years since quitting: 52.2   Smokeless tobacco: Never  Vaping Use   Vaping Use: Never used  Substance and Sexual Activity   Alcohol use: Not Currently    Comment: rare- 1 glass of wine   Drug use: Never   Sexual activity: Not on file     Family History  Problem Relation Age of Onset   Dementia Mother 49   Breast cancer Mother    Pneumonia Father 66   Cancer Father    Thyroid disease Daughter    Thyroid disease Daughter    Stroke Neg Hx  Immunization History  Administered Date(s) Administered   Fluad Quad(high Dose 65+) 12/10/2020   Hep A / Hep B 02/15/2012, 03/17/2012, 08/16/2012   Influenza Split 02/12/2009, 01/09/2010   Influenza, High Dose Seasonal PF 01/28/2012, 02/10/2013, 01/23/2014, 02/07/2015, 12/18/2018, 01/30/2020   Influenza, Quadrivalent, Recombinant, Inj, Pf 01/10/2018   Influenza, Seasonal, Injecte, Preservative Fre 12/23/2010   Influenza,inj,Quad PF,6+ Mos 01/11/2017   Moderna Sars-Covid-2 Vaccination 04/24/2019, 05/22/2019, 01/30/2020   Pneumococcal Conjugate-13 01/23/2014   Pneumococcal Polysaccharide-23 01/27/2007   Typhoid Inactivated 02/15/2012     Objective: Sarah Hampton is a pleasant 87 y.o. female WD, WN in NAD. AAO x 3.  Vitals:   12/13/20 2325  BP:  129/68  Temp: 97.9 F (36.6 C)   Vascular Examination:  Capillary refill time to digits immediate b/l. Palpable DP pulse(s) b/l lower extremities Faintly palpable PT pulse(s) b/l lower extremities. Pedal hair absent. Lower extremity skin temperature gradient within normal limits. No pain with calf compression b/l.  Dermatological Examination: Pedal skin is thin shiny, atrophic b/l lower extremities. No open wounds b/l lower extremities. No interdigital macerations b/l lower extremities. Toenails 1-5 b/l elongated, discolored, dystrophic, thickened, crumbly with subungual debris and tenderness to dorsal palpation.  Musculoskeletal: Normal muscle strength 5/5 to all lower extremity muscle groups bilaterally. Hammertoe(s) noted to the 2-5 bilaterally. Patient ambulates independent of any assistive aids.  Neurological: Protective sensation intact 5/5 intact bilaterally with 10g monofilament b/l. Clonus negative b/l.  Assessment: 1. Pain due to onychomycosis of toenails of both feet   2. Acquired hammertoes of both feet     Plan: -Examined patient. -Patient to continue soft, supportive shoe gear daily. -Toenails 1-5 b/l were debrided in length and girth with sterile nail nippers and dremel without iatrogenic bleeding.  -Patient to report any pedal injuries to medical professional immediately. -Patient/POA to call should there be question/concern in the interim.  Return in about 3 months (around 03/14/2021).  Sarah Hampton, DPM

## 2020-12-25 ENCOUNTER — Ambulatory Visit (INDEPENDENT_AMBULATORY_CARE_PROVIDER_SITE_OTHER): Payer: Medicare HMO | Admitting: Family

## 2020-12-25 ENCOUNTER — Encounter: Payer: Self-pay | Admitting: Family

## 2020-12-25 ENCOUNTER — Other Ambulatory Visit: Payer: Self-pay

## 2020-12-25 ENCOUNTER — Ambulatory Visit
Admission: RE | Admit: 2020-12-25 | Discharge: 2020-12-25 | Disposition: A | Discharge status: Home / Self Care | Payer: Medicare HMO | Source: Ambulatory Visit | Attending: Family | Admitting: Family

## 2020-12-25 VITALS — BP 128/60 | HR 63 | Temp 97.5°F | Resp 16 | Ht 59.0 in | Wt 102.0 lb

## 2020-12-25 DIAGNOSIS — R059 Cough, unspecified: Secondary | ICD-10-CM

## 2020-12-25 DIAGNOSIS — R5383 Other fatigue: Secondary | ICD-10-CM

## 2020-12-25 NOTE — Progress Notes (Signed)
Provider: Sabria Florido FNP-C  Lauree Chandler, NP  Patient Care Team: Lauree Chandler, NP as PCP - General (Geriatric Medicine) Tayvin Preslar, Nelda Bucks, NP as Nurse Practitioner (Family Medicine)  Extended Emergency Contact Information Primary Emergency Contact: Melynda Ripple) Mobile Phone: [phone] Relation: Daughter Preferred language: English  Code Status:  DNR Goals of care: Advanced Directive information Advanced Directives 12/25/2020  Does Patient Have a Medical Advance Directive? Yes  Type of Paramedic of [place];Living will  Does patient want to make changes to medical advance directive? No - Patient declined  Copy of [place] in Chart? Yes - validated most recent copy scanned in chart (See row information)     Chief Complaint  Patient presents with   Acute Visit    Patient c/o left side abdomen pain, pain associated with breathing, and fatigue. Patient took an at home covid test and it was negative.     HPI:  Pt is a 87 y.o. female seen today for an acute visit for evaluation of left side abdominal pain x 1 days.pain worst with breathing.Also has fatigue.Daughter states fatigue worsening. Did a COVID-19 test at home was negative.  Wonders whether she strained while she was exercising.No recent fall episode.  She denies any wheezing,shortness of breath,chest pain,chest tightness or palpitation.Also denies any fever,chills,nausea,vomiting,abdominal pain,flank pain,urgency,frequency,dysuria,difficult urination or hematuria. She will be moving from [facility] to [place] per daughter was not being assisted and did not have a call button working. Wonders whether she was being given someone else medication making her tired all the time.   Past Medical History:  Diagnosis Date   Arthritis    History of melanoma    shoulder   History of squamous cell carcinoma    History of  TIA (transient ischemic attack)    Hypertension    Macrocytic anemia    Mini stroke    Per [facility] New Patient Packet    Other hyperlipidemia    Personal history of malignant neoplasm of other organs and systems    Seizure [facility])    Past Surgical History:  Procedure Laterality Date   CATARACT EXTRACTION     DENTAL SURGERY     SKIN SURGERY  07/2018   SQUAMOUS CELL CARCINOMA EXCISION     TONSILLECTOMY  1946   Per [facility] New Patient Packet     No Known Allergies  Outpatient Encounter Medications as of 12/25/2020  Medication Sig   atorvastatin (LIPITOR) 80 MG tablet TAKE 1 TABLET EVERY DAY  AT  6PM   Calcium Carbonate-Vitamin D 600-400 MG-UNIT tablet Take 1 tablet by mouth 2 (two) times daily.   clopidogrel (PLAVIX) 75 MG tablet TAKE 1 TABLET EVERY DAY   fluticasone (FLONASE) 50 MCG/ACT nasal spray Place 1 spray into both nostrils daily as needed (seasonal allergies).   lisinopril (ZESTRIL) 20 MG tablet TAKE 1 TABLET EVERY DAY   OVER THE COUNTER MEDICATION Apply 1 application topically See admin instructions. CBD Recovery Rollon (3oz/57m) use as needed for back pain   vitamin B-12 (CYANOCOBALAMIN) 1000 MCG tablet Take 1 tablet (1,000 mcg total) by mouth daily.   No facility-administered encounter medications on file as of 12/25/2020.    Review of Systems  Constitutional:  Positive for fatigue. Negative for appetite change, chills, fever and unexpected weight change.  HENT:  Negative for congestion, dental problem, ear discharge, ear pain, facial swelling, hearing loss, nosebleeds, postnasal drip, rhinorrhea, sinus pressure, sinus  pain, sneezing, sore throat, tinnitus and trouble swallowing.   Eyes:  Negative for pain, discharge, redness, itching and visual disturbance.  Respiratory:  Negative for cough, chest tightness, shortness of breath and wheezing.   Cardiovascular:  Negative for chest pain, palpitations and leg swelling.       Left chest wall pain with breathing  Gastrointestinal:   Negative for abdominal distention, abdominal pain, blood in stool, constipation, diarrhea, nausea and vomiting.  Genitourinary:  Negative for difficulty urinating, dysuria, flank pain, frequency and urgency.  Musculoskeletal:  Positive for gait problem. Negative for arthralgias, back pain, joint swelling, myalgias, neck pain and neck stiffness.  Skin:  Negative for color change, pallor, rash and wound.  Neurological:  Negative for dizziness, speech difficulty, weakness, light-headedness and headaches.  Hematological:  Does not bruise/bleed easily.  Psychiatric/Behavioral:  Negative for agitation, behavioral problems, confusion, hallucinations and sleep disturbance. The patient is not nervous/anxious.    Immunization History  Administered Date(s) Administered   Fluad Quad(high Dose 65+) 12/10/2020   Hep A / Hep B 02/15/2012, 03/17/2012, 08/16/2012   Influenza Split 02/12/2009, 01/09/2010   Influenza, High Dose Seasonal PF 01/28/2012, 02/10/2013, 01/23/2014, 02/07/2015, 12/18/2018, 01/30/2020   Influenza, Quadrivalent, Recombinant, Inj, Pf 01/10/2018   Influenza, Seasonal, Injecte, Preservative Fre 12/23/2010   Influenza,inj,Quad PF,6+ Mos 01/11/2017   Moderna Sars-Covid-2 Vaccination 04/24/2019, 05/22/2019, 01/30/2020   Pneumococcal Conjugate-13 01/23/2014   Pneumococcal Polysaccharide-23 01/27/2007   Typhoid Inactivated 02/15/2012   Pertinent  Health Maintenance Due  Topic Date Due   INFLUENZA VACCINE  Completed   DEXA SCAN  Completed   Fall Risk  12/25/2020 12/09/2020 12/04/2020 11/26/2020 11/08/2020  Falls in the past year? 1 0 1 0 1  Number falls in past yr: 0 0 1 0 0  Injury with Fall? 0 0 0 0 0  Risk for fall due to : History of fall(s) No Fall Risks History of fall(s);Impaired balance/gait;Impaired mobility No Fall Risks No Fall Risks  Follow up Falls evaluation completed Falls evaluation completed Falls evaluation completed Falls evaluation completed Falls evaluation completed    Functional Status Survey:    Vitals:   12/25/20 1322  BP: 128/60  Resp: 16  Temp: (!) 97.5 F (36.4 C)  Weight: 102 lb (46.3 kg)  Height: 4' 11"  (1.499 m)   Body mass index is 20.6 kg/m. Physical Exam Vitals reviewed.  Constitutional:      General: She is not in acute distress.    Appearance: Normal appearance. She is normal weight. She is not ill-appearing or diaphoretic.  HENT:     Head: Normocephalic.     Right Ear: Tympanic membrane, ear canal and external ear normal. There is no impacted cerumen.     Left Ear: Tympanic membrane, ear canal and external ear normal. There is no impacted cerumen.     Nose: Nose normal. No congestion or rhinorrhea.     Mouth/Throat:     Mouth: Mucous membranes are moist.     Pharynx: Oropharynx is clear. No oropharyngeal exudate or posterior oropharyngeal erythema.  Eyes:     General: No scleral icterus.       Right eye: No discharge.        Left eye: No discharge.     Conjunctiva/sclera: Conjunctivae normal.     Pupils: Pupils are equal, round, and reactive to light.  Neck:     Vascular: No carotid bruit.  Cardiovascular:     Rate and Rhythm: Normal rate and regular rhythm.     Pulses:  Normal pulses.     Heart sounds: Normal heart sounds. No murmur heard.   No friction rub. No gallop.  Pulmonary:     Effort: Pulmonary effort is normal. No respiratory distress.     Breath sounds: Normal breath sounds. No wheezing, rhonchi or rales.  Chest:     Chest wall: No tenderness.  Abdominal:     General: Bowel sounds are normal. There is no distension.     Palpations: Abdomen is soft. There is no mass.     Tenderness: There is no abdominal tenderness. There is no right CVA tenderness, left CVA tenderness, guarding or rebound.  Musculoskeletal:        General: No swelling or tenderness.     Cervical back: Normal range of motion. No rigidity or tenderness.     Right lower leg: No edema.     Left lower leg: No edema.     Comments:  Unsteady gait   Lymphadenopathy:     Cervical: No cervical adenopathy.  Skin:    General: Skin is warm and dry.     Coloration: Skin is not pale.     Findings: No bruising, erythema, lesion or rash.  Neurological:     Mental Status: She is alert and oriented to person, place, and time.     Cranial Nerves: No cranial nerve deficit.     Sensory: No sensory deficit.     Motor: No weakness.     Coordination: Coordination normal.     Gait: Gait abnormal.  Psychiatric:        Mood and Affect: Mood normal.        Speech: Speech normal.        Behavior: Behavior normal.        Thought Content: Thought content normal.        Judgment: Judgment normal.    Labs reviewed: Recent Labs    04/19/20 0000 08/07/20 1223 12/04/20 1224  NA 133* 139 133*  K 4.7 4.8 4.9  CL 100 105 97*  CO2 25 22 26   GLUCOSE 82 88 87  BUN 26* 22 21  CREATININE 1.08* 0.96* 1.09*  CALCIUM 9.4 9.2 9.8   Recent Labs    03/04/20 1211 04/15/20 1351 08/07/20 1223  AST 27 30 25   ALT 24 34 25  ALKPHOS  --  77  --   BILITOT 0.6 0.9 0.8  PROT 6.6 6.6 5.9*  ALBUMIN  --  4.1  --    Recent Labs    08/23/20 1143 11/08/20 1458 12/04/20 1224  WBC 11.5* 9.2 12.4*  NEUTROABS 8,464* 6,247 8,382*  HGB 8.2* 8.2* 8.7*  HCT 27.9* 26.3* 29.0*  MCV 107.3* 105.6* 107.0*  PLT 145 116* 120*   Lab Results  Component Value Date   TSH 2.03 08/07/2020   Lab Results  Component Value Date   HGBA1C 5.5 04/16/2020   Lab Results  Component Value Date   CHOL 105 04/16/2020   HDL 49 04/16/2020   LDLCALC 43 04/16/2020   TRIG 66 04/16/2020   CHOLHDL 2.1 04/16/2020    Significant Diagnostic Results in last 30 days:  No results found.  Assessment/Plan  1. Cough in adult Pain with deep breathing  Non-productive. Will obtain CXR to rule out acute abnormalities  - DG Chest 2 View; Future  2. Fatigue, unspecified type Unclear etiology will obtain blood to rule out acute and metabolic etiologies  - CBC with  Differential/Platelet - CMP with eGFR([facility]) - TSH  Family/ staff  Communication: Reviewed plan of care with patient and daughter  Labs/tests ordered:  - CBC with Differential/Platelet - CMP with eGFR([facility]) - TSH - DG Chest 2 View; Future  Next Appointment: As needed if symptoms worsen or fail to improve    Sandrea Hughs, NP

## 2020-12-25 NOTE — Patient Instructions (Addendum)
-   Please get chest X-ray at [place] at [facility] then will call you with results. - Notify provide if symptoms worsen or fail to improve

## 2020-12-26 ENCOUNTER — Telehealth: Payer: Self-pay

## 2020-12-26 ENCOUNTER — Other Ambulatory Visit: Payer: Self-pay

## 2020-12-26 DIAGNOSIS — E538 Deficiency of other specified B group vitamins: Secondary | ICD-10-CM

## 2020-12-26 LAB — CBC WITH DIFFERENTIAL/PLATELET
Absolute Monocytes: 1093 cells/uL — ABNORMAL HIGH (ref 200–950)
Basophils Absolute: 124 cells/uL (ref 0–200)
Basophils Relative: 1.3 %
Eosinophils Absolute: 276 cells/uL (ref 15–500)
Eosinophils Relative: 2.9 %
HCT: 24.7 % — ABNORMAL LOW (ref 35.0–45.0)
Hemoglobin: 7.8 g/dL — ABNORMAL LOW (ref 11.7–15.5)
Lymphs Abs: 1558 cells/uL (ref 850–3900)
MCH: 33.6 pg — ABNORMAL HIGH (ref 27.0–33.0)
MCHC: 31.6 g/dL — ABNORMAL LOW (ref 32.0–36.0)
MCV: 106.5 fL — ABNORMAL HIGH (ref 80.0–100.0)
Monocytes Relative: 11.5 %
Neutro Abs: 6451 cells/uL (ref 1500–7800)
Neutrophils Relative %: 67.9 %
Platelets: 99 10*3/uL — ABNORMAL LOW (ref 140–400)
RBC: 2.32 10*6/uL — ABNORMAL LOW (ref 3.80–5.10)
RDW: 19.5 % — ABNORMAL HIGH (ref 11.0–15.0)
Total Lymphocyte: 16.4 %
WBC: 9.5 10*3/uL (ref 3.8–10.8)

## 2020-12-26 LAB — COMPLETE METABOLIC PANEL WITH GFR
AG Ratio: 2.2 (calc) (ref 1.0–2.5)
ALT: 13 U/L (ref 6–29)
AST: 20 U/L (ref 10–35)
Albumin: 4.2 g/dL (ref 3.6–5.1)
Alkaline phosphatase (APISO): 107 U/L (ref 37–153)
BUN/Creatinine Ratio: 18 (calc) (ref 6–22)
BUN: 21 mg/dL (ref 7–25)
CO2: 25 mmol/L (ref 20–32)
Calcium: 9 mg/dL (ref 8.6–10.4)
Chloride: 101 mmol/L (ref 98–110)
Creat: 1.17 mg/dL — ABNORMAL HIGH (ref 0.60–0.95)
Globulin: 1.9 g/dL (calc) (ref 1.9–3.7)
Glucose, Bld: 96 mg/dL (ref 65–139)
Potassium: 4.7 mmol/L (ref 3.5–5.3)
Sodium: 136 mmol/L (ref 135–146)
Total Bilirubin: 0.7 mg/dL (ref 0.2–1.2)
Total Protein: 6.1 g/dL (ref 6.1–8.1)
eGFR: 45 mL/min/{1.73_m2} — ABNORMAL LOW (ref >=60)

## 2020-12-26 LAB — TSH: TSH: 1.87 mIU/L (ref 0.40–4.50)

## 2020-12-26 MED ORDER — VITAMIN B-12 1000 MCG PO TABS: 1000.0000 ug | ORAL_TABLET | Freq: Every day | ORAL | 2 refills | Status: DC

## 2020-12-26 NOTE — Telephone Encounter (Signed)
Patient daughter called and states that she wants to know results for patient X-Ray. Patient daughter states that patient stays at [facility]. Patient needs orders from PCP Dewaine Oats Carlos American, NP sent to facility to say that Atorvastatin and Calcium pills can be crushed. Orders can be faxed to facility. Patient daughter states that she also needs Vitamin B12 refilled as well. I will send Vitamin B12 into pharmacy for patient.

## 2020-12-26 NOTE — Telephone Encounter (Signed)
Xray results are not back at this time.   Okay to crush atorvastatin and calcium

## 2021-01-02 ENCOUNTER — Other Ambulatory Visit: Payer: Self-pay

## 2021-01-02 DIAGNOSIS — D649 Anemia, unspecified: Secondary | ICD-10-CM

## 2021-01-07 ENCOUNTER — Telehealth: Payer: Self-pay | Admitting: Nurse Practitioner

## 2021-01-07 DIAGNOSIS — F0150 Vascular dementia without behavioral disturbance: Secondary | ICD-10-CM

## 2021-01-07 DIAGNOSIS — Z8673 Personal history of transient ischemic attack (TIA), and cerebral infarction without residual deficits: Secondary | ICD-10-CM

## 2021-01-07 NOTE — Telephone Encounter (Signed)
Pts daughter(Sarah Hampton) has noticed that each time Sarah Hampton has had these TIA's she's not boucing back as quickly & feels there may be some more cognitive issues going on now.   Sarah Hampton is asking for Neuro referral to have mom evaluated & see what changes have occurred & if any meds need to be changed since her last fall  Please advise Sarah Hampton

## 2021-01-09 NOTE — Telephone Encounter (Signed)
Kathyrn Lass K  You; Lauree Chandler, NP 4 hours ago (12:14 PM)   LM You're right. Referral was placed in Mirelle Biskup 2022 & pt did have some "testing" per daughter with [facility] Feb 2022 with Dr Jannifer Franklin. I told the daughter that she Aarya Robinson need an appt to get an updated referral for issues that need to be addressed?  Please advise  Lattie Haw

## 2021-01-09 NOTE — Telephone Encounter (Signed)
It was my understanding that we have already placed a referral for neurologist due to her hx of TIA and seizures, did she not keep appt?

## 2021-01-09 NOTE — Telephone Encounter (Signed)
Yes lets touch base with an appt

## 2021-01-09 NOTE — Telephone Encounter (Signed)
A Neurology referral was placed on 08/07/2020

## 2021-01-09 NOTE — Telephone Encounter (Signed)
Called and spoke with daughter, Radonna Ricker  She stated that they were just in to be seen and it is hard to keep taking off of work to bring her mother into the office. Stated that she will have to take off twice if she brings her here and then if a referral is placed to a Neurologist.   Daughter is requesting a Neurology appointment to be placed. Stated that the last Neurology referral placed no one reached out to her to schedule the appointment.   Please Advise.

## 2021-01-10 NOTE — Telephone Encounter (Signed)
That is fine, referral placed

## 2021-01-10 NOTE — Telephone Encounter (Signed)
Daughter notified 

## 2021-01-15 LAB — FECAL GLOBIN BY IMMUNOCHEMISTRY
FECAL GLOBIN RESULT:: NOT DETECTED
MICRO NUMBER:: 12542114
SPECIMEN QUALITY:: ADEQUATE

## 2021-02-12 ENCOUNTER — Encounter: Payer: Self-pay | Admitting: Neurology

## 2021-02-12 ENCOUNTER — Ambulatory Visit (INDEPENDENT_AMBULATORY_CARE_PROVIDER_SITE_OTHER): Payer: Medicare HMO | Admitting: Neurology

## 2021-02-12 VITALS — BP 140/53 | HR 85 | Ht 59.0 in | Wt 103.5 lb

## 2021-02-12 DIAGNOSIS — R569 Unspecified convulsions: Secondary | ICD-10-CM | POA: Diagnosis not present

## 2021-02-12 DIAGNOSIS — G459 Transient cerebral ischemic attack, unspecified: Secondary | ICD-10-CM | POA: Diagnosis not present

## 2021-02-12 NOTE — Progress Notes (Signed)
[facility]  PATIENT: Sarah Hampton DOB: [birth year]  REQUESTING CLINICIAN: Lauree Chandler, NP HISTORY FROM: Patient and daughter  REASON FOR VISIT: Multiple TIA   HISTORICAL  CHIEF COMPLAINT:  Chief Complaint  Patient presents with   New Patient (Initial Visit)    Rm 12. Pt accompanied by daughter. History of TIA, vascular dementia without behavioral disturbance. Pt had a TIA in the spring and another TIA in the fall. Pt's daughter reports she had a seizure before TIA. Pt reports she had a fall around one week ago. Daughter is concerned about memory issues.    HISTORY OF PRESENT ILLNESS:  This is a 87 year old woman with past medical history of dementia, multiple TIA, seizure-like activity, hypertension, hyperlipidemia who is presenting to establish care for the multiple TIAs.  Per daughter patient had multiple TIAs, has been seen in the hospital numerous times here in [place] and in [place] in which all the work-up including vessel images and MRI brain did not show any acute stroke.  At one point she was on DAPT for total 21 days and currently she is on Plavix, she is also on Lipitor and lisinopril.  Patient stated that most of her TIA, she is able to tell because she does have bilateral hand numbness followed by inability to speak.  At one point in January she did have seizure-like activity at her daughter's house described as shaking in bilateral upper extremities and leaning to the left side of body.  Since the multiple TIAs patient now is living in assisted living, reported cognition is better, daughter also acknowledged that patient is doing better other than she had a fall last week.  She denies any previous history of seizures other than the event in January, at that time she did have a EEG which showed mild background slowing, no epileptiform discharge no seizures.   Report daily exercise at the assisted living facility.    OTHER MEDICAL  CONDITIONS: Hyperlipidemia, hypertension, vitamin D deficiency, and coronary artery disease, TIA, seizures.   REVIEW OF SYSTEMS: Full 14 system review of systems performed and negative with exception of: as noted in the HPI  ALLERGIES: No Known Allergies  HOME MEDICATIONS: Outpatient Medications Prior to Visit  Medication Sig Dispense Refill   atorvastatin (LIPITOR) 80 MG tablet TAKE 1 TABLET EVERY DAY  AT  6PM 90 tablet 1   Calcium Carbonate-Vitamin D 600-400 MG-UNIT tablet Take 1 tablet by mouth 2 (two) times daily. 60 tablet 11   clopidogrel (PLAVIX) 75 MG tablet TAKE 1 TABLET EVERY DAY 90 tablet 1   fluticasone (FLONASE) 50 MCG/ACT nasal spray Place 1 spray into both nostrils daily as needed (seasonal allergies).     lisinopril (ZESTRIL) 20 MG tablet TAKE 1 TABLET EVERY DAY 90 tablet 1   OVER THE COUNTER MEDICATION Apply 1 application topically See admin instructions. CBD Recovery Rollon (3oz/39mL) use as needed for back pain     vitamin B-12 (CYANOCOBALAMIN) 1000 MCG tablet Take 1 tablet (1,000 mcg total) by mouth daily. 90 tablet 2   No facility-administered medications prior to visit.    PAST MEDICAL HISTORY: Past Medical History:  Diagnosis Date   Arthritis    History of melanoma    shoulder   History of squamous cell carcinoma    History of TIA (transient ischemic attack)    Hypertension    Macrocytic anemia    Mini stroke    Per [facility] New Patient Packet    Other hyperlipidemia  Personal history of malignant neoplasm of other organs and systems    Seizure ([place])     PAST SURGICAL HISTORY: Past Surgical History:  Procedure Laterality Date   CATARACT EXTRACTION     DENTAL SURGERY     SKIN SURGERY  07/2018   SQUAMOUS CELL CARCINOMA EXCISION     TONSILLECTOMY  1946   Per [facility] New Patient Packet     FAMILY HISTORY: Family History  Problem Relation Age of Onset   Dementia Mother 76   Breast cancer Mother    Pneumonia Father 58   Cancer Father    Thyroid  disease Daughter    Thyroid disease Daughter    Stroke Neg Hx     SOCIAL HISTORY: Social History   Socioeconomic History   Marital status: Widowed    Spouse name: Not on file   Number of children: Not on file   Years of education: Not on file   Highest education level: Not on file  Occupational History   Occupation: retired  Tobacco Use   Smoking status: Former    Packs/day: 0.50    Years: 30.00    Pack years: 15.00    Types: Cigarettes    Quit date: 09/28/1968    Years since quitting: 52.4   Smokeless tobacco: Never  Vaping Use   Vaping Use: Never used  Substance and Sexual Activity   Alcohol use: Not Currently    Comment: rare- 1 glass of wine   Drug use: Never   Sexual activity: Not on file  Other Topics Concern   Not on file  Social History Narrative   Per [place] Patient Packet 01/13/2019       Diet: No answer       Caffeine: Coffee       Married, if yes what year: Widowed, married in [place] you live in a house, apartment, assisted living, condo, trailer, ect: One stories, one person (apartment)       Pets: No      Current/Past profession: Museum/gallery conservator       Exercise:Yes, 5-6 x weekly          Living Will: Yes   DNR: Yes   POA/HPOA: Yes      Functional Status:   Do you have difficulty bathing or dressing yourself?No   Do you have difficulty preparing food or eating? No   Do you have difficulty managing your medications? No   Do you have difficulty managing your finances? No   Do you have difficulty affording your medications? No   Social Determinants of Radio broadcast assistant Strain: Not on file  Food Insecurity: Not on file  Transportation Needs: Not on file  Physical Activity: Not on file  Stress: Not on file  Social Connections: Not on file  Intimate Partner Violence: Not on file    PHYSICAL EXAM  GENERAL EXAM/CONSTITUTIONAL: Vitals:  Vitals:   02/12/21 0905  BP: (!) 140/53  Pulse: 85  Weight: 103 lb 8 oz  (46.9 kg)  Height: 4\' 11"  (1.499 m)   Body mass index is 20.9 kg/m. Wt Readings from Last 3 Encounters:  02/12/21 103 lb 8 oz (46.9 kg)  12/25/20 102 lb (46.3 kg)  12/09/20 101 lb 9.6 oz (46.1 kg)   Patient is in no distress; well developed, nourished and groomed; neck is supple  CARDIOVASCULAR: Examination of carotid arteries is normal; no carotid bruits Regular rate and rhythm, no  murmurs Examination of peripheral vascular system by observation and palpation is normal  EYES: Pupils round and reactive to light, Visual fields full to confrontation, Extraocular movements intacts,   MUSCULOSKELETAL: Gait, strength, tone, movements noted in Neurologic exam below  NEUROLOGIC: MENTAL STATUS:  MMSE - [place] Exam 02/12/2021 02/24/2019  Orientation to time 3 5  Orientation to Place 5 5  Registration 3 3  Attention/ Calculation 0 5  Recall 3 2  Language- name 2 objects 2 2  Language- repeat 1 1  Language- follow 3 step command 3 3  Language- read & follow direction 1 1  Write a sentence 1 1  Copy design 0 0  Total score 22 28    CRANIAL NERVE:  2nd, 3rd, 4th, 6th - pupils equal and reactive to light, visual fields full to confrontation, extraocular muscles intact, no nystagmus 5th - facial sensation symmetric 7th - facial strength symmetric 8th - hearing intact 9th - palate elevates symmetrically, uvula midline 11th - shoulder shrug symmetric 12th - tongue protrusion midline  MOTOR:  normal bulk and tone, full strength in the BUE, BLE  SENSORY:  normal and symmetric to light touch, pinprick, temperature, vibration  COORDINATION:  finger-nose-finger, fine finger movements normal  REFLEXES:  deep tendon reflexes present and symmetric  GAIT/STATION:  normal   DIAGNOSTIC DATA (LABS, IMAGING, TESTING) - I reviewed patient records, labs, notes, testing and imaging myself where available.  Lab Results  Component Value Date   WBC 9.5 12/25/2020   HGB  7.8 (L) 12/25/2020   HCT 24.7 (L) 12/25/2020   MCV 106.5 (H) 12/25/2020   PLT 99 (L) 12/25/2020      Component Value Date/Time   NA 136 12/25/2020 1351   NA 132 (A) 06/10/2019 0000   K 4.7 12/25/2020 1351   CL 101 12/25/2020 1351   CO2 25 12/25/2020 1351   GLUCOSE 96 12/25/2020 1351   BUN 21 12/25/2020 1351   BUN 20 06/10/2019 0000   CREATININE 1.17 (H) 12/25/2020 1351   CALCIUM 9.0 12/25/2020 1351   PROT 6.1 12/25/2020 1351   ALBUMIN 4.1 04/15/2020 1351   AST 20 12/25/2020 1351   ALT 13 12/25/2020 1351   ALKPHOS 77 04/15/2020 1351   BILITOT 0.7 12/25/2020 1351   GFRNONAA 53 (L) 08/07/2020 1223   GFRAA 61 08/07/2020 1223   Lab Results  Component Value Date   CHOL 105 04/16/2020   HDL 49 04/16/2020   LDLCALC 43 04/16/2020   TRIG 66 04/16/2020   CHOLHDL 2.1 04/16/2020   Lab Results  Component Value Date   HGBA1C 5.5 04/16/2020   Lab Results  Component Value Date   VITAMINB12 710 08/23/2020   Lab Results  Component Value Date   TSH 1.87 12/25/2020    MRI Brain 03/2020 1. No acute intracranial finding. Advanced chronic small-vessel ischemic changes throughout the brain as outlined above. 2. Low signal of the marrow spaces of the skull base and cervical spine. Whereas this could simply be due to increased hematopoietic elements, the appearance is somewhat patchy and therefore concerning for osseous metastatic disease. No fracture or extraosseous extension.   CTA Head and neck 03/2020 1. No intracranial large or medium vessel occlusion. 2. Atherosclerotic disease at both carotid bifurcations but without stenosis. 3. 30-50% stenoses at both vertebral artery origins. 30-50% stenosis of both vertebral artery V4 segments. 4. 7 mm ground-glass opacity in the right upper lobe. Emphysema and aortic atherosclerosis. Initial follow-up with CT at 6-12 months is  recommended to confirm persistence. If persistent, repeat CT is recommended every 2 years until 5 years of stability  has been established.    ASSESSMENT AND PLAN  87 y.o. year old female with with vascular dementia currently not on medication, additional vascular risk factors including hypertension, hyperlipidemia who is presenting after being diagnosed with multiple TIAs but per history patient reported she is able to tell when she is about to have a TIA because the always start with bilateral hand numbness and then she will have trouble speaking.  There was one episode of seizure-like activity described as shaking in the bilateral upper extremities lasting less than a minute.  Due to stereotypical events, I do suspect these are seizures, she did have a previous EEG that showed minimal slowing but will plan for a repeat EEG and depending on results, will decide about starting antiseizure medication.  Advised the patient to continue monitor the symptoms and to inform me if and when they occur.  I will contact the patient to go over the results of the EEG otherwise I will see her in one year for follow-up.   1. Seizure-like activity ([place])   2. TIA (transient ischemic attack)      PLAN: Continue current medications  Routine EEG  Follow up in 1 year I will contact the patient to go over the results otherwise I will see her in 1 year for follow-up.   Orders Placed This Encounter  Procedures   EEG adult     No orders of the defined types were placed in this encounter.   Return in about 1 year (around 02/12/2022).    Alric Ran, MD 02/12/2021, 12:43 PM  [facility] [address], [place], [place] [address] [phone]

## 2021-02-12 NOTE — Patient Instructions (Addendum)
Continue current medications  Routine EEG  Follow up in 1 year I will contact the patient to go over the results otherwise I will see her in 1 year for follow-up.

## 2021-02-19 ENCOUNTER — Other Ambulatory Visit: Payer: Medicare HMO | Admitting: *Deleted

## 2021-02-20 DIAGNOSIS — R2689 Other abnormalities of gait and mobility: Secondary | ICD-10-CM | POA: Diagnosis not present

## 2021-02-20 DIAGNOSIS — M6281 Muscle weakness (generalized): Secondary | ICD-10-CM | POA: Diagnosis not present

## 2021-02-24 DIAGNOSIS — M6281 Muscle weakness (generalized): Secondary | ICD-10-CM | POA: Diagnosis not present

## 2021-02-24 DIAGNOSIS — R2689 Other abnormalities of gait and mobility: Secondary | ICD-10-CM | POA: Diagnosis not present

## 2021-02-26 ENCOUNTER — Ambulatory Visit: Payer: Medicare HMO | Admitting: Neurology

## 2021-02-26 DIAGNOSIS — R569 Unspecified convulsions: Secondary | ICD-10-CM | POA: Diagnosis not present

## 2021-03-01 DIAGNOSIS — M6281 Muscle weakness (generalized): Secondary | ICD-10-CM | POA: Diagnosis not present

## 2021-03-01 DIAGNOSIS — R2689 Other abnormalities of gait and mobility: Secondary | ICD-10-CM | POA: Diagnosis not present

## 2021-03-04 DIAGNOSIS — M6281 Muscle weakness (generalized): Secondary | ICD-10-CM | POA: Diagnosis not present

## 2021-03-04 DIAGNOSIS — R2689 Other abnormalities of gait and mobility: Secondary | ICD-10-CM | POA: Diagnosis not present

## 2021-03-05 DIAGNOSIS — R2689 Other abnormalities of gait and mobility: Secondary | ICD-10-CM | POA: Diagnosis not present

## 2021-03-05 DIAGNOSIS — M6281 Muscle weakness (generalized): Secondary | ICD-10-CM | POA: Diagnosis not present

## 2021-03-05 NOTE — Procedures (Signed)
° ° °  History:  87 year old woman with dementia and seizure like activity   EEG classification: Awake and drowsy  Description of the recording: The background rhythms of this recording consists of a fairly well modulated medium amplitude alpha rhythm of 4-6 Hz that is reactive to eye opening and closure. As the record progresses, the patient appears to remain in the waking state throughout the recording. Photic stimulation was performed, did not show any abnormalities. Hyperventilation was not performed. Toward the end of the recording, the patient enters the drowsy state with slight symmetric slowing seen. The patient never enters stage II sleep. No abnormal epileptiform discharges seen during this recording. There was no focal slowing. EKG monitor shows no evidence of cardiac rhythm abnormalities with a heart rate of 72.  Impression: This is an abnormal EEG recording in the waking and drowsy state due to diffuse generalized slowing. Generalized slowing consistent with a diffuse brain dysfunction such as in encephalopathy. These findings can also be seen in dementia patient.    Alric Ran, MD [facility]

## 2021-03-06 ENCOUNTER — Other Ambulatory Visit: Payer: Self-pay | Admitting: Neurology

## 2021-03-06 MED ORDER — MEMANTINE HCL 5 MG PO TABS: 5.0000 mg | ORAL_TABLET | Freq: Two times a day (BID) | ORAL | 3 refills | Status: DC

## 2021-03-06 NOTE — Progress Notes (Signed)
Spoke wight daughter, discussed EEG results showing diffuse slowing which can be seen in dementia patient. She has not had any more events concerning for seizures but daughter reports that memory is getting worse. I will start her on Namenda 5 mg BID and advise daughter to call if worse. I will see her as scheduled.

## 2021-03-06 NOTE — Progress Notes (Signed)
Spoke wight daughter, discussed EEG results showing diffuse slowing which can be seen in dementia patient. She has not had any more events concerning for seizures but daughter reports that memory is getting worse. I will start her on Namenda 5 mg BID and advise daughter to call if worse. I will see her as scheduled.   Alric Ran, MD

## 2021-03-10 ENCOUNTER — Other Ambulatory Visit: Payer: Self-pay

## 2021-03-10 ENCOUNTER — Encounter: Payer: Self-pay | Admitting: Nurse Practitioner

## 2021-03-10 ENCOUNTER — Ambulatory Visit (INDEPENDENT_AMBULATORY_CARE_PROVIDER_SITE_OTHER): Payer: Medicare HMO | Admitting: Nurse Practitioner

## 2021-03-10 ENCOUNTER — Ambulatory Visit (HOSPITAL_COMMUNITY)
Admission: RE | Admit: 2021-03-10 | Discharge: 2021-03-10 | Disposition: A | Discharge status: Home / Self Care | Payer: Medicare HMO | Source: Ambulatory Visit | Attending: Nurse Practitioner | Admitting: Nurse Practitioner

## 2021-03-10 VITALS — BP 142/78 | HR 89 | Temp 96.9°F | Ht 59.0 in | Wt 100.2 lb

## 2021-03-10 DIAGNOSIS — F0150 Vascular dementia without behavioral disturbance: Secondary | ICD-10-CM | POA: Diagnosis not present

## 2021-03-10 DIAGNOSIS — E785 Hyperlipidemia, unspecified: Secondary | ICD-10-CM | POA: Diagnosis not present

## 2021-03-10 DIAGNOSIS — J069 Acute upper respiratory infection, unspecified: Secondary | ICD-10-CM

## 2021-03-10 DIAGNOSIS — E538 Deficiency of other specified B group vitamins: Secondary | ICD-10-CM

## 2021-03-10 DIAGNOSIS — I10 Essential (primary) hypertension: Secondary | ICD-10-CM

## 2021-03-10 DIAGNOSIS — D649 Anemia, unspecified: Secondary | ICD-10-CM

## 2021-03-10 DIAGNOSIS — U071 COVID-19: Secondary | ICD-10-CM

## 2021-03-10 DIAGNOSIS — R1310 Dysphagia, unspecified: Secondary | ICD-10-CM | POA: Diagnosis not present

## 2021-03-10 DIAGNOSIS — R059 Cough, unspecified: Secondary | ICD-10-CM | POA: Diagnosis not present

## 2021-03-10 MED ORDER — AZITHROMYCIN 250 MG PO TABS: ORAL_TABLET | ORAL | 0 refills | Status: AC

## 2021-03-10 MED ORDER — CEFUROXIME AXETIL 500 MG PO TABS: 500.0000 mg | ORAL_TABLET | Freq: Two times a day (BID) | ORAL | 0 refills | Status: AC

## 2021-03-10 NOTE — Patient Instructions (Addendum)
Mucinex DM by mouth twice daily with full glass of water   -recommended to take Vit C 1000 mg twice daily, Vit D 2000 units daily and zinc 50 mg daily for 7 days -maintain proper hydration -tylenol 500 mg by mouth 1-2 tablets every 8 hours as needed fever/body aches.  -do not sit in bed all day, sit up in chair and walk around as tolerated -nasal wash daily and nasal saline as needed throughout the day.  humidifier in the home to help with the dry air Avoid forcefully blowing nose  Probiotic twice daily for 7 days To start 2 antibiotic  - azithromycin (ZITHROMAX) 250 MG tablet; Take 2 tablets on day 1, then 1 tablet daily on days 2 through 5  Dispense: 6 tablet; Refill: 0 - cefUROXime (CEFTIN) 500 MG tablet; Take 1 tablet (500 mg total) by mouth 2 (two) times daily for 10 days.  Dispense: 20 tablet; Refill: 0

## 2021-03-10 NOTE — Progress Notes (Signed)
Careteam: Sarah Hampton Care Team: Lauree Chandler, NP as PCP - General (Geriatric Medicine) Ngetich, Nelda Bucks, NP as Nurse Practitioner (Family Medicine)  PLACE OF SERVICE:  [place] Directive information Does Sarah Hampton Have a Medical Advance Directive?: Yes, Type of Advance Directive: Healthcare Power of [place];Living will;Out of facility DNR (pink MOST or yellow form), Pre-existing out of facility DNR order (yellow form or pink MOST form): Yellow form placed in chart (order not valid for inpatient use);Pink MOST form placed in chart (order not valid for inpatient use), Does Sarah Hampton want to make changes to medical advance directive?: No - Sarah Hampton declined  No Known Allergies  Chief Complaint  Sarah Hampton presents with   Medical Management of Chronic Issues    4 month follow-up. Discuss need for td/tdap, shingrix and covid booster or postpone if Sarah Hampton does not qualify or refuses. Sarah Hampton c/o breathing issues, cough, that are causing chest discomfort. Here with daughter Sarah Hampton     HPI: Sarah Hampton is a 87 y.o. female for follow up.  Tested positive for COVID ~1 week ago. Daughter reports she was feeling bad for at least 1 week before that. She was having increase cough nonproductive.  No shortness of breath but back hurts.  Using cough drops which have helped.  No sore throat  Having nasal congestion.  Daughter reports she has had worsening confusion (this is better today)  Weakness and unsteady gait- improving with therapy   Review of Systems:  Review of Systems  Constitutional:  Negative for chills, fever and weight loss.  HENT:  Negative for tinnitus.   Respiratory:  Negative for cough, sputum production and shortness of breath.   Cardiovascular:  Negative for chest pain, palpitations and leg swelling.  Gastrointestinal:  Negative for abdominal pain, constipation, diarrhea and heartburn.  Genitourinary:  Negative for dysuria, frequency and urgency.  Musculoskeletal:   Negative for back pain, falls, joint pain and myalgias.  Skin: Negative.   Neurological:  Negative for dizziness and headaches.  Psychiatric/Behavioral:  Negative for depression and memory loss. The Sarah Hampton does not have insomnia.    Past Medical History:  Diagnosis Date   Arthritis    History of melanoma    shoulder   History of squamous cell carcinoma    History of TIA (transient ischemic attack)    Hypertension    Macrocytic anemia    Mini stroke    Per [facility] Sarah Hampton Packet    Other hyperlipidemia    Personal history of malignant neoplasm of other organs and systems    Seizure [facility])    Past Surgical History:  Procedure Laterality Date   CATARACT EXTRACTION     DENTAL SURGERY     SKIN SURGERY  07/2018   SQUAMOUS CELL CARCINOMA EXCISION     TONSILLECTOMY  1946   Per [place] New Sarah Hampton Packet    Social History:   reports that she quit smoking about 52 years ago. Her smoking use included cigarettes. She has a 15.00 pack-year smoking history. She has never used smokeless tobacco. She reports that she does not currently use alcohol. She reports that she does not use drugs.  Family History  Problem Relation Age of Onset   Dementia Mother 24   Breast cancer Mother    Pneumonia Father 36   Cancer Father    Thyroid disease Daughter    Thyroid disease Daughter    Stroke Neg Hx     Medications: Sarah Hampton's Medications  New Prescriptions   No  medications on file  Previous Medications   ATORVASTATIN (LIPITOR) 80 MG TABLET    TAKE 1 TABLET EVERY DAY  AT  6PM   CALCIUM CARBONATE-VITAMIN D 600-400 MG-UNIT TABLET    Take 1 tablet by mouth 2 (two) times daily.   CLOPIDOGREL (PLAVIX) 75 MG TABLET    TAKE 1 TABLET EVERY DAY   FLUTICASONE (FLONASE) 50 MCG/ACT NASAL SPRAY    Place 1 spray into both nostrils daily as needed (seasonal allergies).   LISINOPRIL (ZESTRIL) 20 MG TABLET    TAKE 1 TABLET EVERY DAY   MEMANTINE (NAMENDA) 5 MG TABLET    Take 1 tablet (5 mg total) by mouth 2 (two)  times daily.   OVER THE COUNTER MEDICATION    Apply 1 application topically See admin instructions. CBD Recovery Rollon (3oz/41mL) use as needed for back pain   VITAMIN B-12 (CYANOCOBALAMIN) 1000 MCG TABLET    Take 1 tablet (1,000 mcg total) by mouth daily.  Modified Medications   No medications on file  Discontinued Medications   No medications on file    Physical Exam:  Vitals:   03/10/21 1453  BP: (!) 142/78  Pulse: 89  Temp: (!) 96.9 F (36.1 C)  TempSrc: Temporal  SpO2: 99%  Weight: 100 lb 3.2 oz (45.5 kg)  Height: 4\' 11"  (1.499 m)   Body mass index is 20.24 kg/m. Wt Readings from Last 3 Encounters:  03/10/21 100 lb 3.2 oz (45.5 kg)  02/12/21 103 lb 8 oz (46.9 kg)  12/25/20 102 lb (46.3 kg)    Physical Exam Constitutional:      General: She is not in acute distress.    Appearance: She is well-developed. She is not diaphoretic.  HENT:     Head: Normocephalic and atraumatic.     Mouth/Throat:     Pharynx: No oropharyngeal exudate.  Eyes:     Conjunctiva/sclera: Conjunctivae normal.     Pupils: Pupils are equal, round, and reactive to light.  Cardiovascular:     Rate and Rhythm: Normal rate and regular rhythm.     Heart sounds: Normal heart sounds.  Pulmonary:     Effort: Pulmonary effort is normal.     Breath sounds: Rhonchi (right lung base) present.  Abdominal:     General: Bowel sounds are normal.     Palpations: Abdomen is soft.  Musculoskeletal:     Cervical back: Normal range of motion and neck supple.     Right lower leg: No edema.     Left lower leg: No edema.  Skin:    General: Skin is warm and dry.  Neurological:     Mental Status: She is alert. Mental status is at baseline.     Motor: Weakness present.     Gait: Gait abnormal.  Psychiatric:        Mood and Affect: Mood normal.    Labs reviewed: Basic Metabolic Panel: Recent Labs    08/07/20 1223 12/04/20 1224 12/25/20 1351  NA 139 133* 136  K 4.8 4.9 4.7  CL 105 97* 101  CO2 22  26 25   GLUCOSE 88 87 96  BUN 22 21 21   CREATININE 0.96* 1.09* 1.17*  CALCIUM 9.2 9.8 9.0  TSH 2.03  --  1.87   Liver Function Tests: Recent Labs    04/15/20 1351 08/07/20 1223 12/25/20 1351  AST 30 25 20   ALT 34 25 13  ALKPHOS 77  --   --   BILITOT 0.9 0.8 0.7  PROT  6.6 5.9* 6.1  ALBUMIN 4.1  --   --    No results for input(s): LIPASE, AMYLASE in the last 8760 hours. No results for input(s): AMMONIA in the last 8760 hours. CBC: Recent Labs    11/08/20 1458 12/04/20 1224 12/25/20 1351  WBC 9.2 12.4* 9.5  NEUTROABS 6,247 8,382* 6,451  HGB 8.2* 8.7* 7.8*  HCT 26.3* 29.0* 24.7*  MCV 105.6* 107.0* 106.5*  PLT 116* 120* 99*   Lipid Panel: Recent Labs    04/16/20 0446  CHOL 105  HDL 49  LDLCALC 43  TRIG 66  CHOLHDL 2.1   TSH: Recent Labs    08/07/20 1223 12/25/20 1351  TSH 2.03 1.87   A1C: Lab Results  Component Value Date   HGBA1C 5.5 04/16/2020     Assessment/Plan 1. Upper respiratory infection with cough and congestion - DG Chest 2 View; Future - azithromycin (ZITHROMAX) 250 MG tablet; Take 2 tablets on day 1, then 1 tablet daily on days 2 through 5  Dispense: 6 tablet; Refill: 0 - cefUROXime (CEFTIN) 500 MG tablet; Take 1 tablet (500 mg total) by mouth 2 (two) times daily with a meal for 10 days.  Dispense: 20 tablet; Refill: 0  2. COVID-19 - COVID positive a week ago but with ongoing cough, now with back discomfort.  -suspect pneumonia and will treat at this time. Will follow up chest xray Mucinex DM by mouth twice daily with full glass of water  -recommended to take Vit C 1000 mg twice daily, Vit D 2000 units daily and zinc 50 mg daily for 7 days -maintain proper hydration -tylenol 500 mg by mouth 1-2 tablets every 8 hours as needed fever/body aches.  -do not sit in bed all day, sit up in chair and walk around as tolerated -nasal wash daily and nasal saline as needed throughout the day.  humidifier in the home to help with the dry air Avoid  forcefully blowing nose - azithromycin (ZITHROMAX) 250 MG tablet; Take 2 tablets on day 1, then 1 tablet daily on days 2 through 5  Dispense: 6 tablet; Refill: 0 - cefUROXime (CEFTIN) 500 MG tablet; Take 1 tablet (500 mg total) by mouth 2 (two) times daily with a meal for 10 days.  Dispense: 20 tablet; Refill: 0  3. Anemia, unspecified type -continues on supplement - CBC with Differential/Platelet - Iron, TIBC and Ferritin Panel - Vitamin B12  4. Vascular dementia without behavioral disturbance ([place]) -ongoing, has had worsening confusion with recent COVID.  Continues on namenda  5. Essential hypertension --stable. Goal bp <140/90. Continue on current regimen with low sodium diet.   6. Hyperlipidemia LDL goal <70 -continues on lipitor. Will follow up lab today - Lipid panel  7. B12 deficiency -continues on supplement  - Vitamin B12  8. Dysphagia, unspecified type -reports some trouble swallowing pills. Will get ST for further evaluation at this time.  - Ambulatory referral to [place]    Next appt: 3 months.  Sooner if needed  [employer]. [place], [facility] [phone]

## 2021-03-11 DIAGNOSIS — M6281 Muscle weakness (generalized): Secondary | ICD-10-CM | POA: Diagnosis not present

## 2021-03-11 DIAGNOSIS — R2689 Other abnormalities of gait and mobility: Secondary | ICD-10-CM | POA: Diagnosis not present

## 2021-03-11 LAB — CBC WITH DIFFERENTIAL/PLATELET
Absolute Monocytes: 944 cells/uL (ref 200–950)
Basophils Absolute: 186 cells/uL (ref 0–200)
Basophils Relative: 1.3 %
Eosinophils Absolute: 443 cells/uL (ref 15–500)
Eosinophils Relative: 3.1 %
HCT: 26.1 % — ABNORMAL LOW (ref 35.0–45.0)
Hemoglobin: 8.3 g/dL — ABNORMAL LOW (ref 11.7–15.5)
Lymphs Abs: 1788 cells/uL (ref 850–3900)
MCH: 34.2 pg — ABNORMAL HIGH (ref 27.0–33.0)
MCHC: 31.8 g/dL — ABNORMAL LOW (ref 32.0–36.0)
MCV: 107.4 fL — ABNORMAL HIGH (ref 80.0–100.0)
MPV: 13.7 fL — ABNORMAL HIGH (ref 7.5–12.5)
Monocytes Relative: 6.6 %
Neutro Abs: 10940 cells/uL — ABNORMAL HIGH (ref 1500–7800)
Neutrophils Relative %: 76.5 %
Platelets: 179 10*3/uL (ref 140–400)
RBC: 2.43 10*6/uL — ABNORMAL LOW (ref 3.80–5.10)
RDW: 18.4 % — ABNORMAL HIGH (ref 11.0–15.0)
Total Lymphocyte: 12.5 %
WBC: 14.3 10*3/uL — ABNORMAL HIGH (ref 3.8–10.8)

## 2021-03-11 LAB — LIPID PANEL
Cholesterol: 94 mg/dL (ref <200)
HDL: 39 mg/dL — ABNORMAL LOW (ref >=50)
LDL Cholesterol (Calc): 35 mg/dL (calc)
Non-HDL Cholesterol (Calc): 55 mg/dL (calc) (ref <130)
Total CHOL/HDL Ratio: 2.4 (calc) (ref <5.0)
Triglycerides: 122 mg/dL (ref <150)

## 2021-03-11 LAB — IRON,TIBC AND FERRITIN PANEL
%SAT: 27 % (calc) (ref 16–45)
Ferritin: 150 ng/mL (ref 16–288)
Iron: 66 ug/dL (ref 45–160)
TIBC: 245 mcg/dL (calc) — ABNORMAL LOW (ref 250–450)

## 2021-03-11 LAB — VITAMIN B12: Vitamin B-12: 1231 pg/mL — ABNORMAL HIGH (ref 200–1100)

## 2021-03-12 ENCOUNTER — Telehealth: Payer: Self-pay | Admitting: *Deleted

## 2021-03-12 NOTE — Telephone Encounter (Signed)
Tiffany with [facility] called requesting verbal order to Evaluate and Treat patient for PT.   Verbal order given.

## 2021-03-14 DIAGNOSIS — R2689 Other abnormalities of gait and mobility: Secondary | ICD-10-CM | POA: Diagnosis not present

## 2021-03-14 DIAGNOSIS — M6281 Muscle weakness (generalized): Secondary | ICD-10-CM | POA: Diagnosis not present

## 2021-03-16 ENCOUNTER — Emergency Department (HOSPITAL_COMMUNITY)
Admission: EM | Admit: 2021-03-16 | Discharge: 2021-03-16 | Disposition: A | Discharge status: Home / Self Care | Payer: Medicare HMO | Attending: Emergency Medicine | Admitting: Emergency Medicine

## 2021-03-16 ENCOUNTER — Emergency Department (HOSPITAL_COMMUNITY): Payer: Medicare HMO

## 2021-03-16 ENCOUNTER — Other Ambulatory Visit: Payer: Self-pay

## 2021-03-16 ENCOUNTER — Telehealth: Payer: Self-pay | Admitting: Family

## 2021-03-16 DIAGNOSIS — R410 Disorientation, unspecified: Secondary | ICD-10-CM | POA: Diagnosis not present

## 2021-03-16 DIAGNOSIS — N183 Chronic kidney disease, stage 3 unspecified: Secondary | ICD-10-CM | POA: Diagnosis not present

## 2021-03-16 DIAGNOSIS — R29810 Facial weakness: Secondary | ICD-10-CM | POA: Diagnosis not present

## 2021-03-16 DIAGNOSIS — I129 Hypertensive chronic kidney disease with stage 1 through stage 4 chronic kidney disease, or unspecified chronic kidney disease: Secondary | ICD-10-CM | POA: Diagnosis not present

## 2021-03-16 DIAGNOSIS — Z85828 Personal history of other malignant neoplasm of skin: Secondary | ICD-10-CM | POA: Insufficient documentation

## 2021-03-16 DIAGNOSIS — R569 Unspecified convulsions: Secondary | ICD-10-CM | POA: Diagnosis not present

## 2021-03-16 DIAGNOSIS — Z79899 Other long term (current) drug therapy: Secondary | ICD-10-CM | POA: Insufficient documentation

## 2021-03-16 DIAGNOSIS — Z87891 Personal history of nicotine dependence: Secondary | ICD-10-CM | POA: Diagnosis not present

## 2021-03-16 DIAGNOSIS — G40909 Epilepsy, unspecified, not intractable, without status epilepticus: Secondary | ICD-10-CM | POA: Diagnosis not present

## 2021-03-16 LAB — COMPREHENSIVE METABOLIC PANEL
ALT: 21 U/L (ref 0–44)
AST: 25 U/L (ref 15–41)
Albumin: 4.1 g/dL (ref 3.5–5.0)
Alkaline Phosphatase: 143 U/L — ABNORMAL HIGH (ref 38–126)
Anion gap: 9 (ref 5–15)
BUN: 22 mg/dL (ref 8–23)
CO2: 27 mmol/L (ref 22–32)
Calcium: 9.4 mg/dL (ref 8.9–10.3)
Chloride: 93 mmol/L — ABNORMAL LOW (ref 98–111)
Creatinine, Ser: 1.04 mg/dL — ABNORMAL HIGH (ref 0.44–1.00)
GFR, Estimated: 52 mL/min — ABNORMAL LOW (ref >60)
Glucose, Bld: 120 mg/dL — ABNORMAL HIGH (ref 70–99)
Potassium: 4.7 mmol/L (ref 3.5–5.1)
Sodium: 129 mmol/L — ABNORMAL LOW (ref 135–145)
Total Bilirubin: 0.8 mg/dL (ref 0.3–1.2)
Total Protein: 6.3 g/dL — ABNORMAL LOW (ref 6.5–8.1)

## 2021-03-16 LAB — CBC WITH DIFFERENTIAL/PLATELET
Abs Immature Granulocytes: 0.6 10*3/uL — ABNORMAL HIGH (ref 0.00–0.07)
Basophils Absolute: 0.2 10*3/uL — ABNORMAL HIGH (ref 0.0–0.1)
Basophils Relative: 2 %
Eosinophils Absolute: 0.2 10*3/uL (ref 0.0–0.5)
Eosinophils Relative: 2 %
HCT: 27.9 % — ABNORMAL LOW (ref 36.0–46.0)
Hemoglobin: 8.8 g/dL — ABNORMAL LOW (ref 12.0–15.0)
Lymphocytes Relative: 19 %
Lymphs Abs: 2.2 10*3/uL (ref 0.7–4.0)
MCH: 34.6 pg — ABNORMAL HIGH (ref 26.0–34.0)
MCHC: 31.5 g/dL (ref 30.0–36.0)
MCV: 109.8 fL — ABNORMAL HIGH (ref 80.0–100.0)
Metamyelocytes Relative: 1 %
Monocytes Absolute: 1 10*3/uL (ref 0.1–1.0)
Monocytes Relative: 9 %
Myelocytes: 3 %
Neutro Abs: 7.2 10*3/uL (ref 1.7–7.7)
Neutrophils Relative %: 63 %
Platelets: 150 10*3/uL (ref 150–400)
Promyelocytes Relative: 1 %
RBC: 2.54 MIL/uL — ABNORMAL LOW (ref 3.87–5.11)
RDW: 20.4 % — ABNORMAL HIGH (ref 11.5–15.5)
Smear Review: NORMAL
WBC: 11.4 10*3/uL — ABNORMAL HIGH (ref 4.0–10.5)
nRBC: 2 /100 WBC — ABNORMAL HIGH
nRBC: 2.2 % — ABNORMAL HIGH (ref 0.0–0.2)

## 2021-03-16 MED ORDER — LEVETIRACETAM 250 MG PO TABS
250.0000 mg | ORAL_TABLET | Freq: Once | ORAL | Status: AC
  Administered 2021-03-16: 23:00:00 250 mg via ORAL
  Filled 2021-03-16: qty 1

## 2021-03-16 MED ORDER — LEVETIRACETAM 250 MG PO TABS: 250.0000 mg | ORAL_TABLET | Freq: Two times a day (BID) | ORAL | 0 refills | Status: DC

## 2021-03-16 NOTE — Telephone Encounter (Signed)
Late Entry 03/16/2021: 6 Pm  Patient's daughter called states patient had symptoms of TIA yesterday.Has had similar symptoms in the past.POA concerned because patient had another episode today son in law noticed patient's face was asymmetrical when asked to smile.No weakness reported.Daughter wonders whether she should wait and bring in patient to office next week for evaluation. POA advised to send patient to ED as soon as possible due to new symptoms of asymmetry of face. POA verbalized understanding.

## 2021-03-16 NOTE — ED Notes (Signed)
Informed by [facility] EMT Orene Desanctis that patient is coming from CT to room 18.

## 2021-03-16 NOTE — ED Provider Notes (Signed)
Emergency Medicine Provider Triage Evaluation Note  Sarah Hampton , a 87 y.o. female  was evaluated in triage.  Pt here with daughter for strokelike symptoms that occurred 1.5 hours ago and resolved on their own within 10 minutes.  Symptoms include right-sided facial droop, slurred speech, right-sided weakness.  Patient does have a neurologist for seizures.  Previously was expected that she had frequent TIAs, but per daughter neurologist stated this was actually her seizure activity.  According to daughter, patient may have had a similar episode yesterday lasting for about 10 minutes.  Currently she feels weak and slightly off balance.  Review of Systems  Positive: Weakness; at time of symptoms, right-sided facial droop, sided weakness, slurred speech Negative: Headache, fall, fever  Physical Exam  BP (!) 140/55 (BP Location: Right Arm)    Pulse 87    Temp 98.5 F (36.9 C) (Oral)    Resp 16    Ht 4\' 11"  (1.499 m)    Wt 45.5 kg    SpO2 98%    BMI 20.26 kg/m  Gen:   Awake, no distress   Resp:  Normal effort  MSK:   Moves extremities without difficulty  Other: Grip strength slightly diminished bilaterally. Speech is clear, able to follow commands CN III-XII intact Sensation normal to light and sharp touch Moves extremities without ataxia, coordination intact Normal finger to nose and rapid alternating movements No pronator drift    Medical Decision Making  Medically screening exam initiated at 8:01 PM.  Appropriate orders placed.  Sarah Hampton was informed that the remainder of the evaluation will be completed by another provider, this initial triage assessment does not replace that evaluation, and the importance of remaining in the ED until their evaluation is complete.  Charge nurse notified, CT and labs ordered.   Rodena Piety 03/16/21 2004    Wyvonnia Dusky, MD 03/16/21 2015

## 2021-03-16 NOTE — ED Provider Notes (Signed)
[facility] EMERGENCY DEPARTMENT Provider Note   CSN: [id number] Arrival date & time: 03/16/21  1848     History Chief Complaint  Patient presents with   Facial Droop    Sarah Hampton is a 87 y.o. female with PMH HTN, suspected seizure disorder who presents to the emergency department for evaluation of a facial droop.  Prior to today, the patient has had multiple episodes of confusion, word finding difficulty that sometimes are preceded by shaking events.  Today, the patient had an episode of word finding difficulty that was followed by an hour-long left-sided facial droop that since resolved.  On evaluation here in the emergency department, the patient has no symptoms including chest pain, shortness of breath, nausea, vomiting, numbness, tingling, weakness or any other systemic or neurologic complaints.  The patient's neurologists appear to be concerned about possible focal seizure behavior and instructed the patient to be evaluated by neurology if the symptoms increase or return.  HPI     Past Medical History:  Diagnosis Date   Arthritis    History of melanoma    shoulder   History of squamous cell carcinoma    History of TIA (transient ischemic attack)    Hypertension    Macrocytic anemia    Mini stroke    Per [facility] New Patient Packet    Other hyperlipidemia    Personal history of malignant neoplasm of other organs and systems    Seizure [facility])     Patient Active Problem List   Diagnosis Date Noted   Macrocytic anemia 04/15/2020   Leukocytosis 04/15/2020   History of TIA (transient ischemic attack) 04/15/2020   Other hyperlipidemia 11/12/2017   TIA (transient ischemic attack) 09/28/2017   Essential hypertension 09/28/2017   CKD (chronic kidney disease), stage III ([place]) 09/28/2017   Arthritis 08/24/2017   Squamous cell carcinoma 08/24/2017   Widowed 08/24/2017   Personal history of malignant neoplasm of other organs and systems 10/16/2016     Past Surgical History:  Procedure Laterality Date   CATARACT EXTRACTION     DENTAL SURGERY     SKIN SURGERY  07/2018   SQUAMOUS CELL CARCINOMA EXCISION     TONSILLECTOMY  1946   Per [facility] New Patient Packet      OB History   No obstetric history on file.     Family History  Problem Relation Age of Onset   Dementia Mother 40   Breast cancer Mother    Pneumonia Father 42   Cancer Father    Thyroid disease Daughter    Thyroid disease Daughter    Stroke Neg Hx     Social History   Tobacco Use   Smoking status: Former    Packs/day: 0.50    Years: 30.00    Pack years: 15.00    Types: Cigarettes    Quit date: 09/28/1968    Years since quitting: 52.4   Smokeless tobacco: Never  Vaping Use   Vaping Use: Never used  Substance Use Topics   Alcohol use: Not Currently    Comment: rare- 1 glass of wine   Drug use: Never    Home Medications Prior to Admission medications   Medication Sig Start Date End Date Taking? Authorizing Provider  levETIRAcetam (KEPPRA) 250 MG tablet Take 1 tablet (250 mg total) by mouth 2 (two) times daily. 03/16/21 04/15/21 Yes Geniya Fulgham, MD  atorvastatin (LIPITOR) 80 MG tablet TAKE 1 TABLET EVERY DAY  AT  [facility] 09/30/20  Lauree Chandler, NP  Calcium Carbonate-Vitamin D 600-400 MG-UNIT tablet Take 1 tablet by mouth 2 (two) times daily. 09/20/20   Lauree Chandler, NP  cefUROXime (CEFTIN) 500 MG tablet Take 1 tablet (500 mg total) by mouth 2 (two) times daily with a meal for 10 days. 03/10/21 03/20/21  Lauree Chandler, NP  clopidogrel (PLAVIX) 75 MG tablet TAKE 1 TABLET EVERY DAY 09/30/20   Lauree Chandler, NP  fluticasone (FLONASE) 50 MCG/ACT nasal spray Place 1 spray into both nostrils daily as needed (seasonal allergies).    [provider]  lisinopril (ZESTRIL) 20 MG tablet TAKE 1 TABLET EVERY DAY 09/30/20   Lauree Chandler, NP  memantine (NAMENDA) 5 MG tablet Take 1 tablet (5 mg total) by mouth 2 (two) times daily. Patient  not taking: Reported on 03/10/2021 03/06/21 04/05/21  Alric Ran, MD  OVER THE COUNTER MEDICATION Apply 1 application topically See admin instructions. CBD Recovery Rollon (3oz/60mL) use as needed for back pain    [provider]  vitamin B-12 (CYANOCOBALAMIN) 1000 MCG tablet Take 1 tablet (1,000 mcg total) by mouth daily. 12/26/20   Lauree Chandler, NP    Allergies    Patient has no known allergies.  Review of Systems   Review of Systems  Constitutional:  Negative for chills and fever.  HENT:  Negative for ear pain and sore throat.   Eyes:  Negative for pain and visual disturbance.  Respiratory:  Negative for cough and shortness of breath.   Cardiovascular:  Negative for chest pain and palpitations.  Gastrointestinal:  Negative for abdominal pain and vomiting.  Genitourinary:  Negative for dysuria and hematuria.  Musculoskeletal:  Negative for arthralgias and back pain.  Skin:  Negative for color change and rash.  Neurological:  Positive for facial asymmetry. Negative for seizures and syncope.  Psychiatric/Behavioral:  Positive for confusion.   All other systems reviewed and are negative.  Physical Exam Updated Vital Signs BP (!) 146/58    Pulse 79    Temp 98.2 F (36.8 C) (Oral)    Resp 20    Ht 4\' 11"  (1.499 m)    Wt 45.5 kg    SpO2 94%    BMI 20.26 kg/m   Physical Exam Vitals and nursing note reviewed.  Constitutional:      General: She is not in acute distress.    Appearance: She is well-developed.  HENT:     Head: Normocephalic and atraumatic.  Eyes:     Conjunctiva/sclera: Conjunctivae normal.  Cardiovascular:     Rate and Rhythm: Normal rate and regular rhythm.     Heart sounds: No murmur heard. Pulmonary:     Effort: Pulmonary effort is normal. No respiratory distress.     Breath sounds: Normal breath sounds.  Abdominal:     Palpations: Abdomen is soft.     Tenderness: There is no abdominal tenderness.  Musculoskeletal:        General: No  swelling.     Cervical back: Neck supple.  Skin:    General: Skin is warm and dry.     Capillary Refill: Capillary refill takes less than 2 seconds.  Neurological:     Mental Status: She is alert and oriented to person, place, and time.     Cranial Nerves: No cranial nerve deficit.     Sensory: No sensory deficit.     Motor: No weakness.  Psychiatric:        Mood and Affect: Mood normal.  ED Results / Procedures / Treatments   Labs (all labs ordered are listed, but only abnormal results are displayed) Labs Reviewed  COMPREHENSIVE METABOLIC PANEL - Abnormal; Notable for the following components:      Result Value   Sodium 129 (*)    Chloride 93 (*)    Glucose, Bld 120 (*)    Creatinine, Ser 1.04 (*)    Total Protein 6.3 (*)    Alkaline Phosphatase 143 (*)    GFR, Estimated 52 (*)    All other components within normal limits  CBC WITH DIFFERENTIAL/PLATELET - Abnormal; Notable for the following components:   WBC 11.4 (*)    RBC 2.54 (*)    Hemoglobin 8.8 (*)    HCT 27.9 (*)    MCV 109.8 (*)    MCH 34.6 (*)    RDW 20.4 (*)    nRBC 2.2 (*)    Basophils Absolute 0.2 (*)    nRBC 2 (*)    Abs Immature Granulocytes 0.60 (*)    All other components within normal limits  URINALYSIS, ROUTINE W REFLEX MICROSCOPIC    EKG None  Radiology CT Head Wo Contrast  Result Date: 03/16/2021 CLINICAL DATA:  facial droop for 1 hour, initial encounter EXAM: CT HEAD WITHOUT CONTRAST TECHNIQUE: Contiguous axial images were obtained from the base of the skull through the vertex without intravenous contrast. COMPARISON:  04/15/2020 FINDINGS: Brain: No evidence of acute infarction, hemorrhage, hydrocephalus, extra-axial collection or mass lesion/mass effect. Chronic atrophic and ischemic changes are noted. Vascular: No hyperdense vessel or unexpected calcification. Skull: Normal. Negative for fracture or focal lesion. Sinuses/Orbits: Air-fluid levels are noted in the sphenoid maxillary antrum is  consistent with acute sinusitis. Other: None IMPRESSION: Chronic atrophic and ischemic changes without acute abnormality. Multifocal air-fluid levels within the paranasal sinuses consistent with acute sinusitis. Electronically Signed   By: Inez Catalina M.D.   On: 03/16/2021 21:28    Procedures Procedures   Medications Ordered in ED Medications  levETIRAcetam (KEPPRA) tablet 250 mg (250 mg Oral Given 03/16/21 2255)    ED Course  I have reviewed the triage vital signs and the nursing notes.  Pertinent labs & imaging results that were available during my care of the patient were reviewed by me and considered in my medical decision making (see chart for details).  Clinical Course as of 03/16/21 2342  Nancy Fetter Mar 16, 2021  2207 250 bid [MK]    Clinical Course User Index [MK] Teressa Lower, MD   MDM Rules/Calculators/A&P                          Patient seen the emergency department for evaluation of facial droop and abnormal behavior.  Physical exam is unremarkable with no evidence of facial droop or other cranial nerve deficits, no focal motor or sensory deficits.  Neurologic exam otherwise unremarkable, physical exam unremarkable.  Laboratory evaluation with leukocytosis to 11.4, hemoglobin 8.8 with MCV of 109.8 which is chronic for this patient, patient has a mild hyponatremia to 129 but she has been hovering around the 130s chronically.  CT head with no acute abnormality.  Patient is currently on antibiotics for a sinus infection which is also seen on CT head.  I spoke with neurology who agrees that the patient's presentation are likely seizure behavior with today's event may be being Todd's paralysis.  As patient has [place] no symptoms here in the emergency department, we will start the patient on  Keppra 250 twice daily and this medication was sent to her pharmacy.  Patient then discharged   Final Clinical Impression(s) / ED Diagnoses Final diagnoses:  Facial droop  Seizure-like activity  ([place])    Rx / DC Orders ED Discharge Orders          Ordered    levETIRAcetam (KEPPRA) 250 MG tablet  2 times daily        03/16/21 2217             Teressa Lower, MD 03/16/21 2348

## 2021-03-16 NOTE — ED Triage Notes (Addendum)
Pt reports facial droop that started about an hour ago. Pt's family states they first noticed the facial droop yesterday.

## 2021-03-18 ENCOUNTER — Telehealth: Payer: Self-pay | Admitting: Neurology

## 2021-03-18 DIAGNOSIS — R2689 Other abnormalities of gait and mobility: Secondary | ICD-10-CM | POA: Diagnosis not present

## 2021-03-18 DIAGNOSIS — M6281 Muscle weakness (generalized): Secondary | ICD-10-CM | POA: Diagnosis not present

## 2021-03-18 MED ORDER — MEMANTINE HCL 5 MG PO TABS: 5.0000 mg | ORAL_TABLET | Freq: Two times a day (BID) | ORAL | 11 refills | Status: DC

## 2021-03-18 MED ORDER — LEVETIRACETAM 250 MG PO TABS: 250.0000 mg | ORAL_TABLET | Freq: Two times a day (BID) | ORAL | 11 refills | Status: DC

## 2021-03-18 NOTE — Addendum Note (Signed)
Addended by: Noberto Retort C on: 03/18/2021 12:02 PM   Modules accepted: Orders

## 2021-03-18 NOTE — Telephone Encounter (Signed)
I spoke to the patient's daughter. Her mother resides at [facility]. They need orders faxed over to allow them to give her memantine and levetiracetam.  Reports her mother going to the ED on 03/16/21 for seizure-like activity. She was started on levetiracetam 250mg , one tab BID. There are no further refills and she is asking for Dr. April Manson to manage this medication.   [facility]: Ph: [phone] Fax: [phone]

## 2021-03-18 NOTE — Telephone Encounter (Signed)
Yes. Please send her a prescription for Keppra 250 mg BID.Thanks

## 2021-03-18 NOTE — Telephone Encounter (Signed)
Memantine and levetiracetam prescriptions printed,signed by MD and faxed to facility w/ confirmation of receipt.

## 2021-03-18 NOTE — Telephone Encounter (Signed)
Pt's daughter [name] called states her mother is in a assisted living facility and they are needing an order to giver her mother the memantine (NAMENDA) 5 MG tablet and the levETIRAcetam (KEPPRA) 250 MG tablet. [name] is requesting a call back.

## 2021-03-20 DIAGNOSIS — M6281 Muscle weakness (generalized): Secondary | ICD-10-CM | POA: Diagnosis not present

## 2021-03-20 DIAGNOSIS — R2689 Other abnormalities of gait and mobility: Secondary | ICD-10-CM | POA: Diagnosis not present

## 2021-03-24 DIAGNOSIS — R2681 Unsteadiness on feet: Secondary | ICD-10-CM | POA: Diagnosis not present

## 2021-03-24 DIAGNOSIS — M6281 Muscle weakness (generalized): Secondary | ICD-10-CM | POA: Diagnosis not present

## 2021-03-25 ENCOUNTER — Telehealth: Payer: Self-pay | Admitting: Neurology

## 2021-03-25 MED ORDER — MEMANTINE HCL 5 MG PO TABS: 5.0000 mg | ORAL_TABLET | Freq: Two times a day (BID) | ORAL | 3 refills | Status: DC

## 2021-03-25 MED ORDER — LEVETIRACETAM 250 MG PO TABS
250.0000 mg | ORAL_TABLET | Freq: Two times a day (BID) | ORAL | 3 refills | Status: DC
Start: 2021-03-25 — End: 2022-06-18

## 2021-03-25 NOTE — Telephone Encounter (Signed)
I spoke to the patient's daughter. She needs the memantine and levetiracetam prescribed by our office to be sent to the mail order pharmacy below. This request has been completed.

## 2021-03-25 NOTE — Telephone Encounter (Signed)
Pt's daughter, Eulas Post (on [place]) request that her medication for her seizures  be sent [facility]. Would like a call from the nurse.  Ms. Francena Hanly did not know the name of the medication

## 2021-03-27 DIAGNOSIS — M6281 Muscle weakness (generalized): Secondary | ICD-10-CM | POA: Diagnosis not present

## 2021-03-27 DIAGNOSIS — R2681 Unsteadiness on feet: Secondary | ICD-10-CM | POA: Diagnosis not present

## 2021-03-28 ENCOUNTER — Other Ambulatory Visit: Payer: Self-pay

## 2021-03-28 ENCOUNTER — Telehealth: Payer: Self-pay | Admitting: *Deleted

## 2021-03-28 ENCOUNTER — Ambulatory Visit: Payer: Medicare HMO | Admitting: Podiatry

## 2021-03-28 ENCOUNTER — Encounter: Payer: Self-pay | Admitting: Podiatry

## 2021-03-28 DIAGNOSIS — M79674 Pain in right toe(s): Secondary | ICD-10-CM

## 2021-03-28 DIAGNOSIS — B351 Tinea unguium: Secondary | ICD-10-CM

## 2021-03-28 DIAGNOSIS — M79675 Pain in left toe(s): Secondary | ICD-10-CM

## 2021-03-28 NOTE — Telephone Encounter (Signed)
Okay to have tylenol 325 mg 2 tablets every 6 hours as needed headache/pain

## 2021-03-28 NOTE — Telephone Encounter (Signed)
DJ, Caregiver, called and stated that patient is having headaches. No other symptoms noted. Has had on and off for years. See's Neurologist. Requesting an order to be faxed to [facility] for Tylenol for patient to take when she has these Headaches.   [facility]: [phone]  Please Advise.

## 2021-03-28 NOTE — Telephone Encounter (Signed)
Spoke with Pam at [facility] and instructed to fax to Fax: [phone]  Medication list updated and faxed.

## 2021-03-31 DIAGNOSIS — R2681 Unsteadiness on feet: Secondary | ICD-10-CM | POA: Diagnosis not present

## 2021-03-31 DIAGNOSIS — M6281 Muscle weakness (generalized): Secondary | ICD-10-CM | POA: Diagnosis not present

## 2021-04-02 NOTE — Progress Notes (Signed)
°  Subjective:  Patient ID: Sarah Hampton, female    DOB: [birth year],  MRN: [id number]  Sarah Hampton presents to clinic today for painful elongated mycotic toenails 1-5 bilaterally which are tender when wearing enclosed shoe gear. Pain is relieved with periodic professional debridement.  She is accompanied by her daughter, Sarah Hampton, on today's visit. They note no new pedal problems today.  PCP is Sarah Chandler, NP , and last visit was 03/10/2021.  No Known Allergies  Review of Systems: Negative except as noted in the HPI. Objective:   Constitutional Sarah Hampton is a pleasant 88 y.o. Caucasian female, WD, WN in NAD. AAO x 3.   Vascular Capillary refill time to digits immediate b/l. Palpable DP pulse(s) b/l LE. Faintly palpable PT pulse(s) b/l LE. Pedal hair absent. No pain with calf compression b/l. Lower extremity skin temperature gradient within normal limits. No edema noted b/l LE. No cyanosis or clubbing noted b/l LE.  Neurologic Normal speech. Oriented to person, place, and time. Protective sensation intact 5/5 intact bilaterally with 10g monofilament b/l.  Dermatologic Pedal skin thin, shiny and atrophic b/l LE. No open wounds b/l LE. No interdigital macerations noted b/l LE. Toenails 1-5 b/l elongated, discolored, dystrophic, thickened, crumbly with subungual debris and tenderness to dorsal palpation. No hyperkeratotic nor porokeratotic lesions present on today's visit.  Orthopedic: Muscle strength 5/5 to all lower extremity muscle groups bilaterally. Hammertoe deformity noted 2-5 b/l.   Radiographs: None  Last A1c:  Hemoglobin A1C Latest Ref Rng & Units 04/16/2020  HGBA1C 4.8 - 5.6 % 5.5  Some recent data might be hidden   Assessment:   1. Pain due to onychomycosis of toenails of both feet    Plan:  Patient was evaluated and treated and all questions answered. Consent given for treatment as described below: -No new findings. No new orders. -Mycotic toenails 1-5  bilaterally were debrided in length and girth with sterile nail nippers and dremel without incident. -Patient/POA to call should there be question/concern in the interim.  Return in about 3 months (around 06/26/2021).  Marzetta Board, DPM

## 2021-04-03 DIAGNOSIS — R2681 Unsteadiness on feet: Secondary | ICD-10-CM | POA: Diagnosis not present

## 2021-04-03 DIAGNOSIS — M6281 Muscle weakness (generalized): Secondary | ICD-10-CM | POA: Diagnosis not present

## 2021-04-08 DIAGNOSIS — R2681 Unsteadiness on feet: Secondary | ICD-10-CM | POA: Diagnosis not present

## 2021-04-08 DIAGNOSIS — M6281 Muscle weakness (generalized): Secondary | ICD-10-CM | POA: Diagnosis not present

## 2021-04-10 DIAGNOSIS — M6281 Muscle weakness (generalized): Secondary | ICD-10-CM | POA: Diagnosis not present

## 2021-04-10 DIAGNOSIS — R2681 Unsteadiness on feet: Secondary | ICD-10-CM | POA: Diagnosis not present

## 2021-04-11 DIAGNOSIS — R2681 Unsteadiness on feet: Secondary | ICD-10-CM | POA: Diagnosis not present

## 2021-04-11 DIAGNOSIS — M6281 Muscle weakness (generalized): Secondary | ICD-10-CM | POA: Diagnosis not present

## 2021-04-15 DIAGNOSIS — M6281 Muscle weakness (generalized): Secondary | ICD-10-CM | POA: Diagnosis not present

## 2021-04-15 DIAGNOSIS — R2681 Unsteadiness on feet: Secondary | ICD-10-CM | POA: Diagnosis not present

## 2021-04-16 DIAGNOSIS — M6281 Muscle weakness (generalized): Secondary | ICD-10-CM | POA: Diagnosis not present

## 2021-04-16 DIAGNOSIS — R2681 Unsteadiness on feet: Secondary | ICD-10-CM | POA: Diagnosis not present

## 2021-04-17 DIAGNOSIS — M6281 Muscle weakness (generalized): Secondary | ICD-10-CM | POA: Diagnosis not present

## 2021-04-17 DIAGNOSIS — R2681 Unsteadiness on feet: Secondary | ICD-10-CM | POA: Diagnosis not present

## 2021-04-22 DIAGNOSIS — M6281 Muscle weakness (generalized): Secondary | ICD-10-CM | POA: Diagnosis not present

## 2021-04-22 DIAGNOSIS — R2681 Unsteadiness on feet: Secondary | ICD-10-CM | POA: Diagnosis not present

## 2021-04-24 DIAGNOSIS — R2689 Other abnormalities of gait and mobility: Secondary | ICD-10-CM | POA: Diagnosis not present

## 2021-04-28 DIAGNOSIS — R2689 Other abnormalities of gait and mobility: Secondary | ICD-10-CM | POA: Diagnosis not present

## 2021-04-30 DIAGNOSIS — R2689 Other abnormalities of gait and mobility: Secondary | ICD-10-CM | POA: Diagnosis not present

## 2021-05-05 ENCOUNTER — Ambulatory Visit: Payer: Medicare HMO | Admitting: Nurse Practitioner

## 2021-05-06 DIAGNOSIS — R2689 Other abnormalities of gait and mobility: Secondary | ICD-10-CM | POA: Diagnosis not present

## 2021-05-08 ENCOUNTER — Other Ambulatory Visit: Payer: Self-pay | Admitting: Nurse Practitioner

## 2021-05-08 DIAGNOSIS — Z8673 Personal history of transient ischemic attack (TIA), and cerebral infarction without residual deficits: Secondary | ICD-10-CM

## 2021-05-08 DIAGNOSIS — E785 Hyperlipidemia, unspecified: Secondary | ICD-10-CM

## 2021-05-08 DIAGNOSIS — R2689 Other abnormalities of gait and mobility: Secondary | ICD-10-CM | POA: Diagnosis not present

## 2021-05-12 DIAGNOSIS — R2689 Other abnormalities of gait and mobility: Secondary | ICD-10-CM | POA: Diagnosis not present

## 2021-05-15 DIAGNOSIS — R2689 Other abnormalities of gait and mobility: Secondary | ICD-10-CM | POA: Diagnosis not present

## 2021-05-16 ENCOUNTER — Other Ambulatory Visit: Payer: Self-pay

## 2021-05-16 ENCOUNTER — Encounter: Payer: Self-pay | Admitting: Nurse Practitioner

## 2021-05-16 ENCOUNTER — Ambulatory Visit (INDEPENDENT_AMBULATORY_CARE_PROVIDER_SITE_OTHER): Payer: Medicare HMO | Admitting: Nurse Practitioner

## 2021-05-16 VITALS — BP 122/58 | HR 98 | Temp 97.7°F | Ht 59.0 in | Wt 99.4 lb

## 2021-05-16 DIAGNOSIS — I10 Essential (primary) hypertension: Secondary | ICD-10-CM

## 2021-05-16 DIAGNOSIS — L989 Disorder of the skin and subcutaneous tissue, unspecified: Secondary | ICD-10-CM

## 2021-05-16 DIAGNOSIS — F0150 Vascular dementia without behavioral disturbance: Secondary | ICD-10-CM

## 2021-05-16 DIAGNOSIS — Z8673 Personal history of transient ischemic attack (TIA), and cerebral infarction without residual deficits: Secondary | ICD-10-CM

## 2021-05-16 DIAGNOSIS — R569 Unspecified convulsions: Secondary | ICD-10-CM

## 2021-05-16 DIAGNOSIS — R1310 Dysphagia, unspecified: Secondary | ICD-10-CM

## 2021-05-16 DIAGNOSIS — E871 Hypo-osmolality and hyponatremia: Secondary | ICD-10-CM

## 2021-05-16 LAB — COMPLETE METABOLIC PANEL WITH GFR
AG Ratio: 1.9 (calc) (ref 1.0–2.5)
ALT: 21 U/L (ref 6–29)
AST: 24 U/L (ref 10–35)
Albumin: 4.1 g/dL (ref 3.6–5.1)
Alkaline phosphatase (APISO): 134 U/L (ref 37–153)
BUN/Creatinine Ratio: 18 (calc) (ref 6–22)
BUN: 21 mg/dL (ref 7–25)
CO2: 29 mmol/L (ref 20–32)
Calcium: 9.7 mg/dL (ref 8.6–10.4)
Chloride: 96 mmol/L — ABNORMAL LOW (ref 98–110)
Creat: 1.18 mg/dL — ABNORMAL HIGH (ref 0.60–0.95)
Globulin: 2.2 g/dL (calc) (ref 1.9–3.7)
Glucose, Bld: 91 mg/dL (ref 65–139)
Potassium: 4.4 mmol/L (ref 3.5–5.3)
Sodium: 133 mmol/L — ABNORMAL LOW (ref 135–146)
Total Bilirubin: 0.7 mg/dL (ref 0.2–1.2)
Total Protein: 6.3 g/dL (ref 6.1–8.1)
eGFR: 44 mL/min/{1.73_m2} — ABNORMAL LOW (ref >=60)

## 2021-05-16 NOTE — Progress Notes (Signed)
Careteam: Patient Care Team: Lauree Chandler, NP as PCP - General (Geriatric Medicine) Ngetich, Nelda Bucks, NP as Nurse Practitioner (Family Medicine)  PLACE OF SERVICE:  [place] Directive information Does Patient Have a Medical Advance Directive?: Yes, Type of Advance Directive: Healthcare Power of [place];Living will;Out of facility DNR (pink MOST or yellow form), Pre-existing out of facility DNR order (yellow form or pink MOST form): Pink MOST form placed in chart (order not valid for inpatient use), Does patient want to make changes to medical advance directive?: No - Patient declined  No Known Allergies  Chief Complaint  Patient presents with   Medical Management of Chronic Issues    3 month follow-up follow-up and form completion. Discuss need for td/tdap, shingrix, and covid booster or post pone if patient refuses. Patient denies receiving any vaccines since last visit. Here with daughter Bruna Potter.      HPI: Patient is a 88 y.o. female for follow up.  She needs several forms filled out.   Daughter notes some trouble swallowing, coughin with liquids and feeling that food is getting stuck.   2 skin lesions noted one on right thigh and one on right upper arm  Review of Systems:  Review of Systems  Constitutional:  Negative for chills, fever and weight loss.  HENT:  Negative for tinnitus.   Respiratory:  Negative for cough, sputum production and shortness of breath.   Cardiovascular:  Negative for chest pain, palpitations and leg swelling.  Gastrointestinal:  Negative for abdominal pain, constipation, diarrhea and heartburn.  Genitourinary:  Negative for dysuria, frequency and urgency.  Musculoskeletal:  Negative for back pain, falls, joint pain and myalgias.  Neurological:  Negative for dizziness and headaches.  Psychiatric/Behavioral:  Positive for memory loss. Negative for depression. The patient does not have insomnia.    Past Medical History:  Diagnosis  Date   Arthritis    History of melanoma    shoulder   History of squamous cell carcinoma    History of TIA (transient ischemic attack)    Hypertension    Macrocytic anemia    Mini stroke    Per [facility] New Patient Packet    Other hyperlipidemia    Personal history of malignant neoplasm of other organs and systems    Seizure [facility])    Past Surgical History:  Procedure Laterality Date   CATARACT EXTRACTION     DENTAL SURGERY     SKIN SURGERY  07/2018   SQUAMOUS CELL CARCINOMA EXCISION     TONSILLECTOMY  1946   Per [facility] New Patient Packet    Social History:   reports that she quit smoking about 52 years ago. Her smoking use included cigarettes. She has a 15.00 pack-year smoking history. She has never used smokeless tobacco. She reports that she does not currently use alcohol. She reports that she does not use drugs.  Family History  Problem Relation Age of Onset   Dementia Mother 31   Breast cancer Mother    Pneumonia Father 52   Cancer Father    Thyroid disease Daughter    Thyroid disease Daughter    Stroke Neg Hx     Medications: Patient's Medications  New Prescriptions   No medications on file  Previous Medications   ACETAMINOPHEN (TYLENOL) 325 MG TABLET    Take 650 mg by mouth every 6 (six) hours as needed.   ATORVASTATIN (LIPITOR) 80 MG TABLET    TAKE 1 TABLET EVERY DAY  AT  6PM   CALCIUM CARBONATE-VITAMIN D 600-400 MG-UNIT TABLET    Take 1 tablet by mouth 2 (two) times daily.   CLOPIDOGREL (PLAVIX) 75 MG TABLET    TAKE 1 TABLET EVERY DAY   FLUTICASONE (FLONASE) 50 MCG/ACT NASAL SPRAY    Place 1 spray into both nostrils daily as needed (seasonal allergies).   LEVETIRACETAM (KEPPRA) 250 MG TABLET    Take 1 tablet (250 mg total) by mouth 2 (two) times daily.   LISINOPRIL (ZESTRIL) 20 MG TABLET    TAKE 1 TABLET EVERY DAY   MEMANTINE (NAMENDA) 5 MG TABLET    Take 1 tablet (5 mg total) by mouth 2 (two) times daily.   OVER THE COUNTER MEDICATION    Apply 1 application  topically See admin instructions. CBD Recovery Rollon (3oz/30m) use as needed for back pain   VITAMIN B-12 (CYANOCOBALAMIN) 1000 MCG TABLET    Take 1 tablet (1,000 mcg total) by mouth daily.  Modified Medications   No medications on file  Discontinued Medications   No medications on file    Physical Exam:  Vitals:   05/16/21 1516  BP: (!) 122/58  Pulse: 98  Temp: 97.7 F (36.5 C)  TempSrc: Temporal  SpO2: 98%  Weight: 99 lb 6.4 oz (45.1 kg)  Height: 4' 11"  (1.499 m)   Body mass index is 20.08 kg/m. Wt Readings from Last 3 Encounters:  05/16/21 99 lb 6.4 oz (45.1 kg)  03/16/21 100 lb 5 oz (45.5 kg)  03/10/21 100 lb 3.2 oz (45.5 kg)    Physical Exam Constitutional:      General: She is not in acute distress.    Appearance: She is well-developed. She is not diaphoretic.  HENT:     Head: Normocephalic and atraumatic.     Mouth/Throat:     Pharynx: No oropharyngeal exudate.  Eyes:     Conjunctiva/sclera: Conjunctivae normal.     Pupils: Pupils are equal, round, and reactive to light.  Cardiovascular:     Rate and Rhythm: Normal rate and regular rhythm.     Heart sounds: Normal heart sounds.  Pulmonary:     Effort: Pulmonary effort is normal.     Breath sounds: Normal breath sounds.  Abdominal:     General: Bowel sounds are normal.     Palpations: Abdomen is soft.  Musculoskeletal:     Cervical back: Normal range of motion and neck supple.     Right lower leg: No edema.     Left lower leg: No edema.  Skin:    General: Skin is warm and dry.     Findings: Lesion (raised crushed lesion noted to right upper thigh, small crusted lesion on upper right arm) present.  Neurological:     Mental Status: She is alert.  Psychiatric:        Mood and Affect: Mood normal.    Labs reviewed: Basic Metabolic Panel: Recent Labs    08/07/20 1223 12/04/20 1224 12/25/20 1351 03/16/21 2043  NA 139 133* 136 129*  K 4.8 4.9 4.7 4.7  CL 105 97* 101 93*  CO2 22 26 25 27    GLUCOSE 88 87 96 120*  BUN 22 21 21 22   CREATININE 0.96* 1.09* 1.17* 1.04*  CALCIUM 9.2 9.8 9.0 9.4  TSH 2.03  --  1.87  --    Liver Function Tests: Recent Labs    08/07/20 1223 12/25/20 1351 03/16/21 2043  AST 25 20 25   ALT 25 13 21   ALKPHOS  --   --  143*  BILITOT 0.8 0.7 0.8  PROT 5.9* 6.1 6.3*  ALBUMIN  --   --  4.1   No results for input(s): LIPASE, AMYLASE in the last 8760 hours. No results for input(s): AMMONIA in the last 8760 hours. CBC: Recent Labs    12/25/20 1351 03/10/21 1544 03/16/21 2043  WBC 9.5 14.3* 11.4*  NEUTROABS 6,451 10,940* 7.2  HGB 7.8* 8.3* 8.8*  HCT 24.7* 26.1* 27.9*  MCV 106.5* 107.4* 109.8*  PLT 99* 179 150   Lipid Panel: Recent Labs    03/10/21 1544  CHOL 94  HDL 39*  LDLCALC 35  TRIG 122  CHOLHDL 2.4   TSH: Recent Labs    08/07/20 1223 12/25/20 1351  TSH 2.03 1.87   A1C: Lab Results  Component Value Date   HGBA1C 5.5 04/16/2020     Assessment/Plan 1. Vascular dementia without behavioral disturbance ([place]) -ongoing decline, forms completed for financial assistance due to needing increase in care for ADLs.   2. Seizure ([place]) Stable, continues to follow up with neurology, continues on keppra twice daily   3. Essential hypertension -Blood pressure well controlled Continue current medications Recheck metabolic panel  4. History of TIA (transient ischemic attack) Stable, continues on plavix   5. Hyponatremia Will follow up lab  - CMP with eGFR([facility])  6. Dysphagia, unspecified type - SLP modified barium swallow; Future for further evaluation.   7. Skin lesion -she will make appt with her dermatologist for follow up regarding skin lesions.    Return in about 4 months (around 09/13/2021) for routine follow up. Carlos American. [place], [facility] [phone]

## 2021-05-20 DIAGNOSIS — R2689 Other abnormalities of gait and mobility: Secondary | ICD-10-CM | POA: Diagnosis not present

## 2021-05-22 ENCOUNTER — Telehealth (HOSPITAL_COMMUNITY): Payer: Self-pay

## 2021-05-22 DIAGNOSIS — R2689 Other abnormalities of gait and mobility: Secondary | ICD-10-CM | POA: Diagnosis not present

## 2021-05-22 NOTE — Telephone Encounter (Signed)
Attempted to contact patient to schedule OP MBS - left voicemail. ?

## 2021-05-26 DIAGNOSIS — R2689 Other abnormalities of gait and mobility: Secondary | ICD-10-CM | POA: Diagnosis not present

## 2021-05-28 DIAGNOSIS — R2689 Other abnormalities of gait and mobility: Secondary | ICD-10-CM | POA: Diagnosis not present

## 2021-05-29 ENCOUNTER — Other Ambulatory Visit (HOSPITAL_COMMUNITY): Payer: Self-pay

## 2021-05-29 DIAGNOSIS — R233 Spontaneous ecchymoses: Secondary | ICD-10-CM | POA: Diagnosis not present

## 2021-05-29 DIAGNOSIS — L988 Other specified disorders of the skin and subcutaneous tissue: Secondary | ICD-10-CM | POA: Diagnosis not present

## 2021-05-29 DIAGNOSIS — D485 Neoplasm of uncertain behavior of skin: Secondary | ICD-10-CM | POA: Diagnosis not present

## 2021-05-29 DIAGNOSIS — C44722 Squamous cell carcinoma of skin of right lower limb, including hip: Secondary | ICD-10-CM | POA: Diagnosis not present

## 2021-05-29 DIAGNOSIS — L821 Other seborrheic keratosis: Secondary | ICD-10-CM | POA: Diagnosis not present

## 2021-05-29 DIAGNOSIS — R1310 Dysphagia, unspecified: Secondary | ICD-10-CM

## 2021-05-29 DIAGNOSIS — L570 Actinic keratosis: Secondary | ICD-10-CM | POA: Diagnosis not present

## 2021-05-29 DIAGNOSIS — L814 Other melanin hyperpigmentation: Secondary | ICD-10-CM | POA: Diagnosis not present

## 2021-06-03 DIAGNOSIS — R2689 Other abnormalities of gait and mobility: Secondary | ICD-10-CM | POA: Diagnosis not present

## 2021-06-05 DIAGNOSIS — R2689 Other abnormalities of gait and mobility: Secondary | ICD-10-CM | POA: Diagnosis not present

## 2021-06-10 DIAGNOSIS — R2689 Other abnormalities of gait and mobility: Secondary | ICD-10-CM | POA: Diagnosis not present

## 2021-06-12 DIAGNOSIS — R2689 Other abnormalities of gait and mobility: Secondary | ICD-10-CM | POA: Diagnosis not present

## 2021-06-13 ENCOUNTER — Ambulatory Visit: Payer: Medicare HMO | Admitting: Podiatry

## 2021-06-16 ENCOUNTER — Ambulatory Visit: Payer: Medicare HMO | Admitting: Nurse Practitioner

## 2021-06-16 DIAGNOSIS — R2689 Other abnormalities of gait and mobility: Secondary | ICD-10-CM | POA: Diagnosis not present

## 2021-06-18 ENCOUNTER — Ambulatory Visit (HOSPITAL_COMMUNITY)
Admission: RE | Admit: 2021-06-18 | Discharge: 2021-06-18 | Disposition: A | Discharge status: Home / Self Care | Payer: Medicare HMO | Source: Ambulatory Visit | Attending: Nurse Practitioner | Admitting: Nurse Practitioner

## 2021-06-18 ENCOUNTER — Other Ambulatory Visit: Payer: Self-pay

## 2021-06-18 DIAGNOSIS — R1310 Dysphagia, unspecified: Secondary | ICD-10-CM | POA: Diagnosis not present

## 2021-06-19 DIAGNOSIS — R2689 Other abnormalities of gait and mobility: Secondary | ICD-10-CM | POA: Diagnosis not present

## 2021-06-23 DIAGNOSIS — R2689 Other abnormalities of gait and mobility: Secondary | ICD-10-CM | POA: Diagnosis not present

## 2021-06-25 DIAGNOSIS — C44722 Squamous cell carcinoma of skin of right lower limb, including hip: Secondary | ICD-10-CM | POA: Diagnosis not present

## 2021-06-25 DIAGNOSIS — R2689 Other abnormalities of gait and mobility: Secondary | ICD-10-CM | POA: Diagnosis not present

## 2021-06-30 DIAGNOSIS — R2689 Other abnormalities of gait and mobility: Secondary | ICD-10-CM | POA: Diagnosis not present

## 2021-07-03 DIAGNOSIS — R2689 Other abnormalities of gait and mobility: Secondary | ICD-10-CM | POA: Diagnosis not present

## 2021-07-07 ENCOUNTER — Ambulatory Visit: Payer: Medicare HMO | Admitting: Podiatry

## 2021-07-07 ENCOUNTER — Encounter: Payer: Self-pay | Admitting: Podiatry

## 2021-07-07 DIAGNOSIS — B351 Tinea unguium: Secondary | ICD-10-CM | POA: Diagnosis not present

## 2021-07-07 DIAGNOSIS — M79675 Pain in left toe(s): Secondary | ICD-10-CM | POA: Diagnosis not present

## 2021-07-07 DIAGNOSIS — M79674 Pain in right toe(s): Secondary | ICD-10-CM | POA: Diagnosis not present

## 2021-07-08 DIAGNOSIS — R2689 Other abnormalities of gait and mobility: Secondary | ICD-10-CM | POA: Diagnosis not present

## 2021-07-11 DIAGNOSIS — R2689 Other abnormalities of gait and mobility: Secondary | ICD-10-CM | POA: Diagnosis not present

## 2021-07-14 ENCOUNTER — Telehealth: Payer: Self-pay | Admitting: Neurology

## 2021-07-14 MED ORDER — LEVETIRACETAM 250 MG PO TABS: 250.0000 mg | ORAL_TABLET | Freq: Two times a day (BID) | ORAL | 0 refills | Status: DC

## 2021-07-14 NOTE — Telephone Encounter (Signed)
Pt's daughter has called to report she was notified by the nursing home of where pt resides that she is out of her mail order of levETIRAcetam (KEPPRA) 250 MG tablet , and the mail order is not expected before Thurs of this week.  Daughter is asking if a supply can be called into the [facility] on [address] so pt does not go without this medication ?

## 2021-07-14 NOTE — Progress Notes (Signed)
?  Subjective:  ?Patient ID: Sarah Hampton, female    DOB: Jun 11, 1930,  MRN: [id number] ? ?Sarah Hampton presents to clinic today for painful thick toenails that are difficult to trim. Pain interferes with ambulation. Aggravating factors include wearing enclosed shoe gear. Pain is relieved with periodic professional debridement. ? ?Patient is accompanied by her daughter on today's visit. ? ?New problem(s): None.  ? ?PCP is Lauree Chandler, NP , and last visit was May 16, 2021. ? ?No Known Allergies ? ?Review of Systems: Negative except as noted in the HPI. ? ?Objective: No changes noted in today's physical examination. ? ?Constitutional Sarah Hampton is a pleasant 88 y.o. Caucasian female, WD, WN in NAD. AAO x 3.   ?Vascular Capillary refill time to digits immediate b/l. Palpable DP pulse(s) b/l LE. Faintly palpable PT pulse(s) b/l LE. Pedal hair absent. No pain with calf compression b/l. Lower extremity skin temperature gradient within normal limits. No edema noted b/l LE. No cyanosis or clubbing noted b/l LE.  ?Neurologic Normal speech. Oriented to person, place, and time. Protective sensation intact 5/5 intact bilaterally with 10g monofilament b/l.  ?Dermatologic Pedal skin thin, shiny and atrophic b/l LE. No open wounds b/l LE. No interdigital macerations noted b/l LE. Toenails 1-5 b/l elongated, discolored, dystrophic, thickened, crumbly with subungual debris and tenderness to dorsal palpation. No hyperkeratotic nor porokeratotic lesions present on today's visit.  ?Orthopedic: Muscle strength 5/5 to all lower extremity muscle groups bilaterally. Hammertoe deformity noted 2-5 b/l.  ? ?Radiographs: None ?Assessment/Plan: ?1. Pain due to onychomycosis of toenails of both feet   ?  ?-Examined patient. ?-Patient to continue soft, supportive shoe gear daily. ?-Toenails 1-5 b/l were debrided in length and girth with sterile nail nippers and dremel without iatrogenic bleeding.  ?-Patient/POA to call  should there be question/concern in the interim.  ? ?Return in about 3 months (around 10/06/2021). ? ?Marzetta Board, DPM  ?

## 2021-07-14 NOTE — Telephone Encounter (Signed)
30-day rx sent to [facility]. Dgt has been notified. ?

## 2021-07-15 DIAGNOSIS — R2689 Other abnormalities of gait and mobility: Secondary | ICD-10-CM | POA: Diagnosis not present

## 2021-07-21 DIAGNOSIS — R2689 Other abnormalities of gait and mobility: Secondary | ICD-10-CM | POA: Diagnosis not present

## 2021-07-24 DIAGNOSIS — R2689 Other abnormalities of gait and mobility: Secondary | ICD-10-CM | POA: Diagnosis not present

## 2021-07-28 DIAGNOSIS — R2689 Other abnormalities of gait and mobility: Secondary | ICD-10-CM | POA: Diagnosis not present

## 2021-08-01 DIAGNOSIS — R2689 Other abnormalities of gait and mobility: Secondary | ICD-10-CM | POA: Diagnosis not present

## 2021-08-04 DIAGNOSIS — R2689 Other abnormalities of gait and mobility: Secondary | ICD-10-CM | POA: Diagnosis not present

## 2021-08-06 DIAGNOSIS — R2689 Other abnormalities of gait and mobility: Secondary | ICD-10-CM | POA: Diagnosis not present

## 2021-08-11 DIAGNOSIS — R2689 Other abnormalities of gait and mobility: Secondary | ICD-10-CM | POA: Diagnosis not present

## 2021-08-13 DIAGNOSIS — R2689 Other abnormalities of gait and mobility: Secondary | ICD-10-CM | POA: Diagnosis not present

## 2021-08-19 DIAGNOSIS — R2689 Other abnormalities of gait and mobility: Secondary | ICD-10-CM | POA: Diagnosis not present

## 2021-08-21 DIAGNOSIS — R2689 Other abnormalities of gait and mobility: Secondary | ICD-10-CM | POA: Diagnosis not present

## 2021-08-25 DIAGNOSIS — R2689 Other abnormalities of gait and mobility: Secondary | ICD-10-CM | POA: Diagnosis not present

## 2021-08-28 DIAGNOSIS — R2689 Other abnormalities of gait and mobility: Secondary | ICD-10-CM | POA: Diagnosis not present

## 2021-09-01 DIAGNOSIS — R2689 Other abnormalities of gait and mobility: Secondary | ICD-10-CM | POA: Diagnosis not present

## 2021-09-04 DIAGNOSIS — R2689 Other abnormalities of gait and mobility: Secondary | ICD-10-CM | POA: Diagnosis not present

## 2021-09-08 DIAGNOSIS — R2689 Other abnormalities of gait and mobility: Secondary | ICD-10-CM | POA: Diagnosis not present

## 2021-09-10 DIAGNOSIS — C44722 Squamous cell carcinoma of skin of right lower limb, including hip: Secondary | ICD-10-CM | POA: Diagnosis not present

## 2021-09-10 DIAGNOSIS — L905 Scar conditions and fibrosis of skin: Secondary | ICD-10-CM | POA: Diagnosis not present

## 2021-09-10 DIAGNOSIS — D0472 Carcinoma in situ of skin of left lower limb, including hip: Secondary | ICD-10-CM | POA: Diagnosis not present

## 2021-09-10 DIAGNOSIS — D485 Neoplasm of uncertain behavior of skin: Secondary | ICD-10-CM | POA: Diagnosis not present

## 2021-09-10 HISTORY — PX: SQUAMOUS CELL CARCINOMA EXCISION: SHX2433

## 2021-09-11 DIAGNOSIS — R2689 Other abnormalities of gait and mobility: Secondary | ICD-10-CM | POA: Diagnosis not present

## 2021-09-12 ENCOUNTER — Ambulatory Visit: Payer: Medicare HMO | Admitting: Podiatry

## 2021-09-12 ENCOUNTER — Encounter: Payer: Self-pay | Admitting: Podiatry

## 2021-09-12 DIAGNOSIS — M79675 Pain in left toe(s): Secondary | ICD-10-CM

## 2021-09-12 DIAGNOSIS — M79674 Pain in right toe(s): Secondary | ICD-10-CM

## 2021-09-12 DIAGNOSIS — B351 Tinea unguium: Secondary | ICD-10-CM

## 2021-09-15 ENCOUNTER — Encounter: Payer: Self-pay | Admitting: Nurse Practitioner

## 2021-09-15 ENCOUNTER — Ambulatory Visit (INDEPENDENT_AMBULATORY_CARE_PROVIDER_SITE_OTHER): Payer: Medicare HMO | Admitting: Nurse Practitioner

## 2021-09-15 VITALS — BP 128/62 | HR 63 | Temp 97.8°F | Ht 59.0 in | Wt 96.8 lb

## 2021-09-15 DIAGNOSIS — F0150 Vascular dementia without behavioral disturbance: Secondary | ICD-10-CM

## 2021-09-15 DIAGNOSIS — R634 Abnormal weight loss: Secondary | ICD-10-CM

## 2021-09-15 DIAGNOSIS — E871 Hypo-osmolality and hyponatremia: Secondary | ICD-10-CM

## 2021-09-15 DIAGNOSIS — Z87898 Personal history of other specified conditions: Secondary | ICD-10-CM

## 2021-09-15 DIAGNOSIS — R600 Localized edema: Secondary | ICD-10-CM | POA: Diagnosis not present

## 2021-09-15 DIAGNOSIS — M545 Low back pain, unspecified: Secondary | ICD-10-CM | POA: Diagnosis not present

## 2021-09-15 DIAGNOSIS — N1831 Chronic kidney disease, stage 3a: Secondary | ICD-10-CM | POA: Diagnosis not present

## 2021-09-15 DIAGNOSIS — G8929 Other chronic pain: Secondary | ICD-10-CM

## 2021-09-15 DIAGNOSIS — D631 Anemia in chronic kidney disease: Secondary | ICD-10-CM | POA: Diagnosis not present

## 2021-09-15 DIAGNOSIS — I10 Essential (primary) hypertension: Secondary | ICD-10-CM | POA: Diagnosis not present

## 2021-09-15 NOTE — Progress Notes (Signed)
Careteam: Patient Care Team: Lauree Chandler, NP as PCP - General (Geriatric Medicine) Ngetich, Nelda Bucks, NP as Nurse Practitioner (Family Medicine)  PLACE OF SERVICE:  [place] Directive information    No Known Allergies  Chief Complaint  Patient presents with   Medical Management of Chronic Issues    Patient presents today for a 4 month follow-up   Acute Visit    Son reports bilateral leg/feet swelling for a few months now. Right hand numbness      HPI: Patient is a 88 y.o. female for routine follow up.   She reports she slipped off the side of her bed and slipped and feel. She did not have any bruising. Happened 2 days ago.  Does not have any increase in pain- chronic back pain but this is unchanged.   Has been having LE edema. No shortness of breath, no pain, redness or heat.  Does not use salt.   Has lost weight, smaller portions and wont eat the whole meal.   She has right hand joint pain- uses a rub that helps.   Review of Systems:  Review of Systems  Constitutional:  Negative for chills, fever and weight loss.  HENT:  Negative for tinnitus.   Respiratory:  Negative for cough, sputum production and shortness of breath.   Cardiovascular:  Positive for leg swelling. Negative for chest pain and palpitations.  Gastrointestinal:  Negative for abdominal pain, constipation, diarrhea and heartburn.  Genitourinary:  Negative for dysuria, frequency and urgency.  Musculoskeletal:  Positive for back pain. Negative for falls, joint pain and myalgias.  Skin: Negative.   Neurological:  Negative for dizziness and headaches.  Psychiatric/Behavioral:  Negative for depression and memory loss. The patient does not have insomnia.     Past Medical History:  Diagnosis Date   Arthritis    History of melanoma    shoulder   History of squamous cell carcinoma    History of TIA (transient ischemic attack)    Hypertension    Macrocytic anemia    Mini stroke     Per [facility] New Patient Packet    Other hyperlipidemia    Personal history of malignant neoplasm of other organs and systems    Seizure [facility])    Past Surgical History:  Procedure Laterality Date   CATARACT EXTRACTION     DENTAL SURGERY     SKIN SURGERY  07/2018   SQUAMOUS CELL CARCINOMA EXCISION     TONSILLECTOMY  1946   Per [facility] New Patient Packet    Social History:   reports that she quit smoking about 53 years ago. Her smoking use included cigarettes. She has a 15.00 pack-year smoking history. She has never used smokeless tobacco. She reports that she does not currently use alcohol. She reports that she does not use drugs.  Family History  Problem Relation Age of Onset   Dementia Mother 39   Breast cancer Mother    Pneumonia Father 26   Cancer Father    Thyroid disease Daughter    Thyroid disease Daughter    Stroke Neg Hx     Medications: Patient's Medications  New Prescriptions   No medications on file  Previous Medications   ACETAMINOPHEN (TYLENOL) 325 MG TABLET    Take 650 mg by mouth every 6 (six) hours as needed.   ATORVASTATIN (LIPITOR) 80 MG TABLET    TAKE 1 TABLET EVERY DAY  AT  6PM   CALCIUM CARBONATE-VITAMIN D 600-400  MG-UNIT TABLET    Take 1 tablet by mouth 2 (two) times daily.   CLOPIDOGREL (PLAVIX) 75 MG TABLET    TAKE 1 TABLET EVERY DAY   FLUTICASONE (FLONASE) 50 MCG/ACT NASAL SPRAY    Place 1 spray into both nostrils daily as needed (seasonal allergies).   LEVETIRACETAM (KEPPRA) 250 MG TABLET    Take 1 tablet (250 mg total) by mouth 2 (two) times daily.   LEVETIRACETAM (KEPPRA) 250 MG TABLET    Take 1 tablet (250 mg total) by mouth 2 (two) times daily.   LISINOPRIL (ZESTRIL) 20 MG TABLET    TAKE 1 TABLET EVERY DAY   MEMANTINE (NAMENDA) 5 MG TABLET    Take 1 tablet (5 mg total) by mouth 2 (two) times daily.   OVER THE COUNTER MEDICATION    Apply 1 application topically See admin instructions. CBD Recovery Rollon (3oz/60m) use as needed for back pain   SF 5000  PLUS 1.1 % CREA DENTAL CREAM    Take by mouth 2 (two) times daily.   VITAMIN B-12 (CYANOCOBALAMIN) 1000 MCG TABLET    Take 1 tablet (1,000 mcg total) by mouth daily.  Modified Medications   No medications on file  Discontinued Medications   No medications on file    Physical Exam:  Vitals:   09/15/21 1508  BP: 128/62  Pulse: 63  Temp: 97.8 F (36.6 C)  SpO2: 97%  Weight: 96 lb 12.8 oz (43.9 kg)  Height: 4' 11"  (1.499 m)   Body mass index is 19.55 kg/m. Wt Readings from Last 3 Encounters:  09/15/21 96 lb 12.8 oz (43.9 kg)  05/16/21 99 lb 6.4 oz (45.1 kg)  03/16/21 100 lb 5 oz (45.5 kg)    Physical Exam Constitutional:      General: She is not in acute distress.    Appearance: She is well-developed. She is not diaphoretic.  HENT:     Head: Normocephalic and atraumatic.     Mouth/Throat:     Pharynx: No oropharyngeal exudate.  Eyes:     Conjunctiva/sclera: Conjunctivae normal.     Pupils: Pupils are equal, round, and reactive to light.  Cardiovascular:     Rate and Rhythm: Normal rate and regular rhythm.     Heart sounds: Normal heart sounds.  Pulmonary:     Effort: Pulmonary effort is normal.     Breath sounds: Normal breath sounds.  Abdominal:     General: Bowel sounds are normal.     Palpations: Abdomen is soft.  Musculoskeletal:     Cervical back: Normal range of motion and neck supple.     Right lower leg: No edema.     Left lower leg: No edema.  Skin:    General: Skin is warm and dry.  Neurological:     Mental Status: She is alert. Mental status is at baseline.  Psychiatric:        Mood and Affect: Mood normal.     Labs reviewed: Basic Metabolic Panel: Recent Labs    12/25/20 1351 03/16/21 2043 05/16/21 1552  NA 136 129* 133*  K 4.7 4.7 4.4  CL 101 93* 96*  CO2 25 27 29   GLUCOSE 96 120* 91  BUN 21 22 21   CREATININE 1.17* 1.04* 1.18*  CALCIUM 9.0 9.4 9.7  TSH 1.87  --   --    Liver Function Tests: Recent Labs    12/25/20 1351  03/16/21 2043 05/16/21 1552  AST 20 25 24   ALT 13 21  21  ALKPHOS  --  143*  --   BILITOT 0.7 0.8 0.7  PROT 6.1 6.3* 6.3  ALBUMIN  --  4.1  --    No results for input(s): "LIPASE", "AMYLASE" in the last 8760 hours. No results for input(s): "AMMONIA" in the last 8760 hours. CBC: Recent Labs    12/25/20 1351 03/10/21 1544 03/16/21 2043  WBC 9.5 14.3* 11.4*  NEUTROABS 6,451 10,940* 7.2  HGB 7.8* 8.3* 8.8*  HCT 24.7* 26.1* 27.9*  MCV 106.5* 107.4* 109.8*  PLT 99* 179 150   Lipid Panel: Recent Labs    03/10/21 1544  CHOL 94  HDL 39*  LDLCALC 35  TRIG 122  CHOLHDL 2.4   TSH: Recent Labs    12/25/20 1351  TSH 1.87   A1C: Lab Results  Component Value Date   HGBA1C 5.5 04/16/2020     Assessment/Plan 1. Essential hypertension --stable. Goal bp <140/90. Continue on lisinopril with low sodium diet.  - CBC with Differential/Platelet - CMP with eGFR([facility])  2. Stage 3a chronic kidney disease ([place]) Encouraged proper hydration Follow metabolic panel Avoid nephrotoxic meds (NSAIDS) - CMP with eGFR([facility])  3. Hyponatremia -will follow up lab - CMP with eGFR([facility])  4. Anemia due to stage 3a chronic kidney disease ([place]) -will follow up on lab today.  - CBC with Differential/Platelet  5. History of seizure -no recent seizures, continues on keppra  6. Vascular dementia without behavioral disturbance (HCC) Stable, no acute changes in cognitive or functional status, continue supportive care.   7. Chronic right-sided low back pain without sciatica -chronic pain but some worse after fall- recommended heating pad TID to lower back- can use tylenol PRN as well.   8. Bilateral edema of lower extremity -due to venous insuffiencey -encouraged to elevate legs above level of heart as tolerates, low sodium diet, compression hose as tolerates (on in am, off in pm)  9. Weight loss -encouraged to liberalize diet. To have protein supplement in addition to smallest meal of  the day.   Return in about 4 months (around 01/15/2022) for routine follow up . Carlos American. [place], [facility] [phone]

## 2021-09-16 ENCOUNTER — Other Ambulatory Visit: Payer: Self-pay

## 2021-09-16 ENCOUNTER — Encounter (HOSPITAL_COMMUNITY): Payer: Self-pay

## 2021-09-16 ENCOUNTER — Observation Stay (HOSPITAL_COMMUNITY)
Admission: EM | Admit: 2021-09-16 | Discharge: 2021-09-17 | Disposition: A | Discharge status: Home / Self Care | Payer: Medicare HMO | Attending: Student | Admitting: Student

## 2021-09-16 DIAGNOSIS — R2689 Other abnormalities of gait and mobility: Secondary | ICD-10-CM | POA: Diagnosis not present

## 2021-09-16 DIAGNOSIS — Z7902 Long term (current) use of antithrombotics/antiplatelets: Secondary | ICD-10-CM | POA: Insufficient documentation

## 2021-09-16 DIAGNOSIS — R0602 Shortness of breath: Secondary | ICD-10-CM | POA: Diagnosis not present

## 2021-09-16 DIAGNOSIS — Z85828 Personal history of other malignant neoplasm of skin: Secondary | ICD-10-CM | POA: Diagnosis not present

## 2021-09-16 DIAGNOSIS — E871 Hypo-osmolality and hyponatremia: Secondary | ICD-10-CM | POA: Diagnosis not present

## 2021-09-16 DIAGNOSIS — D696 Thrombocytopenia, unspecified: Secondary | ICD-10-CM

## 2021-09-16 DIAGNOSIS — I129 Hypertensive chronic kidney disease with stage 1 through stage 4 chronic kidney disease, or unspecified chronic kidney disease: Secondary | ICD-10-CM | POA: Diagnosis not present

## 2021-09-16 DIAGNOSIS — N1831 Chronic kidney disease, stage 3a: Secondary | ICD-10-CM | POA: Insufficient documentation

## 2021-09-16 DIAGNOSIS — D539 Nutritional anemia, unspecified: Secondary | ICD-10-CM | POA: Diagnosis not present

## 2021-09-16 DIAGNOSIS — Z8673 Personal history of transient ischemic attack (TIA), and cerebral infarction without residual deficits: Secondary | ICD-10-CM | POA: Insufficient documentation

## 2021-09-16 DIAGNOSIS — F0390 Unspecified dementia without behavioral disturbance: Secondary | ICD-10-CM | POA: Insufficient documentation

## 2021-09-16 DIAGNOSIS — Z87891 Personal history of nicotine dependence: Secondary | ICD-10-CM | POA: Insufficient documentation

## 2021-09-16 DIAGNOSIS — D649 Anemia, unspecified: Principal | ICD-10-CM | POA: Diagnosis present

## 2021-09-16 DIAGNOSIS — I10 Essential (primary) hypertension: Secondary | ICD-10-CM | POA: Diagnosis present

## 2021-09-16 DIAGNOSIS — Z87898 Personal history of other specified conditions: Secondary | ICD-10-CM

## 2021-09-16 DIAGNOSIS — Z79899 Other long term (current) drug therapy: Secondary | ICD-10-CM | POA: Diagnosis not present

## 2021-09-16 DIAGNOSIS — N183 Chronic kidney disease, stage 3 unspecified: Secondary | ICD-10-CM | POA: Diagnosis present

## 2021-09-16 LAB — COMPLETE METABOLIC PANEL WITH GFR
AG Ratio: 2.5 (calc) (ref 1.0–2.5)
ALT: 19 U/L (ref 6–29)
AST: 21 U/L (ref 10–35)
Albumin: 4.2 g/dL (ref 3.6–5.1)
Alkaline phosphatase (APISO): 138 U/L (ref 37–153)
BUN: 20 mg/dL (ref 7–25)
CO2: 26 mmol/L (ref 20–32)
Calcium: 8.9 mg/dL (ref 8.6–10.4)
Chloride: 100 mmol/L (ref 98–110)
Creat: 0.9 mg/dL (ref 0.60–0.95)
Globulin: 1.7 g/dL (calc) — ABNORMAL LOW (ref 1.9–3.7)
Glucose, Bld: 95 mg/dL (ref 65–139)
Potassium: 4.6 mmol/L (ref 3.5–5.3)
Sodium: 133 mmol/L — ABNORMAL LOW (ref 135–146)
Total Bilirubin: 1 mg/dL (ref 0.2–1.2)
Total Protein: 5.9 g/dL — ABNORMAL LOW (ref 6.1–8.1)
eGFR: 61 mL/min/{1.73_m2} (ref >=60)

## 2021-09-16 LAB — RETICULOCYTES
Immature Retic Fract: 42.7 % — ABNORMAL HIGH (ref 2.3–15.9)
RBC.: 1.77 MIL/uL — ABNORMAL LOW (ref 3.87–5.11)
Retic Count, Absolute: 104.6 10*3/uL (ref 19.0–186.0)
Retic Ct Pct: 5.9 % — ABNORMAL HIGH (ref 0.4–3.1)

## 2021-09-16 LAB — COMPREHENSIVE METABOLIC PANEL
ALT: 21 U/L (ref 0–44)
AST: 22 U/L (ref 15–41)
Albumin: 4.2 g/dL (ref 3.5–5.0)
Alkaline Phosphatase: 126 U/L (ref 38–126)
Anion gap: 7 (ref 5–15)
BUN: 22 mg/dL (ref 8–23)
CO2: 26 mmol/L (ref 22–32)
Calcium: 9.2 mg/dL (ref 8.9–10.3)
Chloride: 101 mmol/L (ref 98–111)
Creatinine, Ser: 0.91 mg/dL (ref 0.44–1.00)
GFR, Estimated: >60 mL/min (ref >60)
Glucose, Bld: 135 mg/dL — ABNORMAL HIGH (ref 70–99)
Potassium: 4.3 mmol/L (ref 3.5–5.1)
Sodium: 134 mmol/L — ABNORMAL LOW (ref 135–145)
Total Bilirubin: 1 mg/dL (ref 0.3–1.2)
Total Protein: 6.4 g/dL — ABNORMAL LOW (ref 6.5–8.1)

## 2021-09-16 LAB — CBC WITH DIFFERENTIAL/PLATELET
Absolute Monocytes: 486 cells/uL (ref 200–950)
Basophils Absolute: 70 cells/uL (ref 0–200)
Basophils Relative: 1.3 %
Eosinophils Absolute: 103 cells/uL (ref 15–500)
Eosinophils Relative: 1.9 %
HCT: 20 % — ABNORMAL LOW (ref 35.0–45.0)
Hemoglobin: 6.3 g/dL — ABNORMAL LOW (ref 11.7–15.5)
Lymphs Abs: 1210 cells/uL (ref 850–3900)
MCH: 35 pg — ABNORMAL HIGH (ref 27.0–33.0)
MCHC: 31.5 g/dL — ABNORMAL LOW (ref 32.0–36.0)
MCV: 111.1 fL — ABNORMAL HIGH (ref 80.0–100.0)
Monocytes Relative: 9 %
Neutro Abs: 3532 cells/uL (ref 1500–7800)
Neutrophils Relative %: 65.4 %
Platelets: 88 10*3/uL — ABNORMAL LOW (ref 140–400)
RBC: 1.8 10*6/uL — ABNORMAL LOW (ref 3.80–5.10)
RDW: 19.9 % — ABNORMAL HIGH (ref 11.0–15.0)
Total Lymphocyte: 22.4 %
WBC: 5.4 10*3/uL (ref 3.8–10.8)

## 2021-09-16 LAB — FOLATE: Folate: 7 ng/mL (ref >5.9)

## 2021-09-16 LAB — CBC
HCT: 20.8 % — ABNORMAL LOW (ref 36.0–46.0)
Hemoglobin: 6.2 g/dL — CL (ref 12.0–15.0)
MCH: 34.6 pg — ABNORMAL HIGH (ref 26.0–34.0)
MCHC: 29.8 g/dL — ABNORMAL LOW (ref 30.0–36.0)
MCV: 116.2 fL — ABNORMAL HIGH (ref 80.0–100.0)
Platelets: 80 10*3/uL — ABNORMAL LOW (ref 150–400)
RBC: 1.79 MIL/uL — ABNORMAL LOW (ref 3.87–5.11)
RDW: 22.2 % — ABNORMAL HIGH (ref 11.5–15.5)
WBC: 4.7 10*3/uL (ref 4.0–10.5)
nRBC: 5.1 % — ABNORMAL HIGH (ref 0.0–0.2)

## 2021-09-16 LAB — VITAMIN B12: Vitamin B-12: 626 pg/mL (ref 180–914)

## 2021-09-16 LAB — FERRITIN: Ferritin: 104 ng/mL (ref 11–307)

## 2021-09-16 LAB — ABO/RH: ABO/RH(D): A NEG

## 2021-09-16 LAB — IRON AND TIBC
Iron: 79 ug/dL (ref 28–170)
Saturation Ratios: 29 % (ref 10.4–31.8)
TIBC: 273 ug/dL (ref 250–450)
UIBC: 194 ug/dL

## 2021-09-16 LAB — PREPARE RBC (CROSSMATCH)

## 2021-09-16 LAB — POC OCCULT BLOOD, ED: Fecal Occult Bld: NEGATIVE

## 2021-09-16 MED ORDER — VITAMIN B-12 1000 MCG PO TABS
1000.0000 ug | ORAL_TABLET | Freq: Every day | ORAL | Status: DC
  Administered 2021-09-17: 1000 ug via ORAL
  Filled 2021-09-16: qty 1

## 2021-09-16 MED ORDER — ACETAMINOPHEN 650 MG RE SUPP: 650.0000 mg | Freq: Four times a day (QID) | RECTAL | Status: DC | PRN

## 2021-09-16 MED ORDER — LEVETIRACETAM 250 MG PO TABS
250.0000 mg | ORAL_TABLET | Freq: Two times a day (BID) | ORAL | Status: DC
  Administered 2021-09-16 – 2021-09-17 (×2): 250 mg via ORAL
  Filled 2021-09-16 (×2): qty 1

## 2021-09-16 MED ORDER — FUROSEMIDE 10 MG/ML IJ SOLN
20.0000 mg | Freq: Once | INTRAMUSCULAR | Status: AC
  Administered 2021-09-16: 20 mg via INTRAVENOUS
  Filled 2021-09-16: qty 2

## 2021-09-16 MED ORDER — ATORVASTATIN CALCIUM 40 MG PO TABS
80.0000 mg | ORAL_TABLET | Freq: Every day | ORAL | Status: DC
  Administered 2021-09-16 – 2021-09-17 (×2): 80 mg via ORAL
  Filled 2021-09-16 (×2): qty 2

## 2021-09-16 MED ORDER — CLOPIDOGREL BISULFATE 75 MG PO TABS
75.0000 mg | ORAL_TABLET | Freq: Every day | ORAL | Status: DC
  Administered 2021-09-17: 75 mg via ORAL
  Filled 2021-09-16: qty 1

## 2021-09-16 MED ORDER — ONDANSETRON HCL 4 MG/2ML IJ SOLN: 4.0000 mg | Freq: Four times a day (QID) | INTRAMUSCULAR | Status: DC | PRN

## 2021-09-16 MED ORDER — MEMANTINE HCL 10 MG PO TABS
5.0000 mg | ORAL_TABLET | Freq: Two times a day (BID) | ORAL | Status: DC
  Administered 2021-09-16 – 2021-09-17 (×2): 5 mg via ORAL
  Filled 2021-09-16 (×2): qty 1

## 2021-09-16 MED ORDER — SODIUM CHLORIDE 0.9% IV SOLUTION: Freq: Once | INTRAVENOUS | Status: AC

## 2021-09-16 MED ORDER — ACETAMINOPHEN 325 MG PO TABS: 650.0000 mg | ORAL_TABLET | Freq: Four times a day (QID) | ORAL | Status: DC | PRN

## 2021-09-16 MED ORDER — ONDANSETRON HCL 4 MG PO TABS: 4.0000 mg | ORAL_TABLET | Freq: Four times a day (QID) | ORAL | Status: DC | PRN

## 2021-09-16 NOTE — H&P (Addendum)
[facility] History and Physical  RONNE ALF ZOX:[id number] DOB: 04-17-1930 DOA: 09/16/2021   PCP: Sharon Seller, NP  Specialists: None  Chief Complaint: Low blood counts  HPI: Sarah Hampton is a 88 y.o. female with a past medical history of TIA, essential hypertension, history of melanoma and squamous cell carcinoma of the skin, macrocytic anemia, seizure disorder on Keppra who [facility].  She was seen by PCP recently and underwent blood work which revealed hemoglobin of 6.3.  Patient was asked to come to the emergency department.  Blood work done in the ED also showed similar values.  Patient denies any bleeding recently.  She has been feeling very fatigued and has been getting short of breath with exertion.  Some dizziness but no syncopal episodes.  No chest pain.  In the emergency department she was evaluated by the ED physician.  Rectal exam is negative for occult blood.  Patient will need blood transfusion.  She will be observed overnight.  Home Medications: Prior to Admission medications   Medication Sig Start Date End Date Taking? Authorizing Provider  acetaminophen (TYLENOL) 325 MG tablet Take 650 mg by mouth every 6 (six) hours as needed for moderate pain.   Yes [provider]  atorvastatin (LIPITOR) 80 MG tablet TAKE 1 TABLET EVERY DAY  AT  6PM Patient taking differently: Take 80 mg by mouth daily. 05/08/21  Yes Sharon Seller, NP  Calcium Carbonate-Vitamin D 600-400 MG-UNIT tablet Take 1 tablet by mouth 2 (two) times daily. 09/20/20  Yes Sharon Seller, NP  clopidogrel (PLAVIX) 75 MG tablet TAKE 1 TABLET EVERY DAY Patient taking differently: Take 75 mg by mouth daily. 05/08/21  Yes Sharon Seller, NP  fluticasone (FLONASE) 50 MCG/ACT nasal spray Place 1 spray into both nostrils daily as needed (seasonal allergies).   Yes [provider]  levETIRAcetam (KEPPRA) 250 MG tablet Take 1 tablet (250 mg total) by mouth 2 (two)  times daily. 03/25/21 03/23/48 Yes Camara, Amalia Hailey, MD  lisinopril (ZESTRIL) 20 MG tablet TAKE 1 TABLET EVERY DAY Patient taking differently: Take 20 mg by mouth daily. 05/08/21  Yes Sharon Seller, NP  memantine (NAMENDA) 5 MG tablet Take 1 tablet (5 mg total) by mouth 2 (two) times daily. 03/25/21 03/23/48 Yes Windell Norfolk, MD  vitamin B-12 (CYANOCOBALAMIN) 1000 MCG tablet Take 1 tablet (1,000 mcg total) by mouth daily. 12/26/20  Yes Sharon Seller, NP    Allergies: No Known Allergies  Past Medical History: Past Medical History:  Diagnosis Date   Arthritis    History of melanoma    shoulder   History of squamous cell carcinoma    History of TIA (transient ischemic attack)    Hypertension    Macrocytic anemia    Mini stroke    Per [facility] New Patient Packet    Other hyperlipidemia    Personal history of malignant neoplasm of other organs and systems    Seizure [facility])     Past Surgical History:  Procedure Laterality Date   CATARACT EXTRACTION     DENTAL SURGERY     SKIN SURGERY  07/2018   SQUAMOUS CELL CARCINOMA EXCISION     TONSILLECTOMY  1946   Per [facility] New Patient Packet     Social History: [facility].  Former smoker.  No alcohol consumption.  No recreational drug use.  Uses a walker to ambulate.  Has a living will and advanced directives.  Family History:  Family History  Problem Relation Age of Onset   Dementia Mother 31   Breast cancer Mother    Pneumonia Father 84   Cancer Father    Thyroid disease Daughter    Thyroid disease Daughter    Stroke Neg Hx      Review of Systems - History obtained from the patient General ROS: positive for  - fatigue Psychological ROS: negative Ophthalmic ROS: negative ENT ROS: negative Allergy and Immunology ROS: negative Hematological and Lymphatic ROS: negative for - bleeding problems, bruising, or jaundice Endocrine ROS: negative Respiratory ROS: As in HPI Cardiovascular ROS: no chest pain or dyspnea on  exertion Gastrointestinal ROS: no abdominal pain, change in bowel habits, or black or bloody stools Genito-Urinary ROS: no dysuria, trouble voiding, or hematuria Musculoskeletal ROS: negative Neurological ROS: no TIA or stroke symptoms Dermatological ROS: negative  Physical Examination  Vitals:   09/16/21 1430 09/16/21 1500 09/16/21 1530 09/16/21 1600  BP: (!) 117/39 (!) 124/41 (!) 124/42 (!) 132/48  Pulse: 78 85 82 87  Resp: 19 (!) 23 (!) 22 18  Temp:      TempSrc:      SpO2: 100% 100% 100% 100%    BP (!) 132/48   Pulse 87   Temp 97.9 F (36.6 C) (Oral)   Resp 18   SpO2 100%   General appearance: alert, cooperative, appears stated age, and no distress Head: Normocephalic, without obvious abnormality, atraumatic Eyes: conjunctivae/corneas clear. PERRL, EOM's intact.  Throat: lips, mucosa, and tongue normal; teeth and gums normal Neck: no adenopathy, no carotid bruit, no JVD, supple, symmetrical, trachea midline, and thyroid not enlarged, symmetric, no tenderness/mass/nodules Resp: clear to auscultation bilaterally Cardio: regular rate and rhythm, S1, S2 normal, no murmur, click, rub or gallop GI: soft, non-tender; bowel sounds normal; no masses,  no organomegaly Extremities: extremities normal, atraumatic, no cyanosis or edema Pulses: 2+ and symmetric Skin: Skin color, texture, turgor normal. No rashes or lesions Lymph nodes: Cervical, supraclavicular, and axillary nodes normal. Neurologic: Awake alert.  Cranial nerves II to XII intact.  Motor strength equal bilateral upper and lower extremities.   Labs on Admission: I have personally reviewed following labs and imaging studies  CBC: Recent Labs  Lab 09/15/21 1544 09/16/21 1341  WBC 5.4 4.7  NEUTROABS 3,532  --   HGB 6.3* 6.2*  HCT 20.0* 20.8*  MCV 111.1* 116.2*  PLT 88* 80*   Basic Metabolic Panel: Recent Labs  Lab 09/15/21 1544 09/16/21 1341  NA 133* 134*  K 4.6 4.3  CL 100 101  CO2 26 26  GLUCOSE 95  135*  BUN 20 22  CREATININE 0.90 0.91  CALCIUM 8.9 9.2   GFR: Estimated Creatinine Clearance: 28.6 mL/min (by C-G formula based on SCr of 0.91 mg/dL). Liver Function Tests: Recent Labs  Lab 09/15/21 1544 09/16/21 1341  AST 21 22  ALT 19 21  ALKPHOS  --  126  BILITOT 1.0 1.0  PROT 5.9* 6.4*  ALBUMIN  --  4.2    Anemia Panel: Recent Labs    09/16/21 1341  VITAMINB12 626  FOLATE 7.0  FERRITIN 104  TIBC 273  IRON 79  RETICCTPCT 5.9*     Radiological Exams on Admission: No results found.  My interpretation of Electrocardiogram: Sinus rhythm in the 80s.  Normal axis.  Interventricular conduction delay is noted.  Nonspecific ST and T wave changes.  Similar to previous EKG.   Problem List  Principal Problem:   Symptomatic anemia Active Problems:  Essential hypertension   CKD (chronic kidney disease), stage III (HCC)   Macrocytic anemia   History of TIA (transient ischemic attack)   Thrombocytopenia (HCC)   Assessment: This is a 88 year old Caucasian female with past medical history as stated earlier who comes in with complaints of fatigue.  Found to have anemia by her PCP.  Will need blood transfusion for which she will need to be hospitalized overnight.  No evidence of bleeding noted.  No clinical suspicion for hemolysis at this time.  Bilirubin is noted to be normal.  This could just be a slow decrease of her hemoglobin over a period of time.  She is also found to have thrombocytopenia.  Reason for this is not entirely clear.  Plan:  #1. Symptomatic anemia/macrocytosis: Old labs were reviewed.  Patient appears to have a longstanding history of anemia.  Seems like her hemoglobin baseline is between 7 to 8.5.  Presented with a hemoglobin of 6.2.  She is symptomatic with fatigue and shortness of breath with exertion.  No overt bleeding identified.  Stool for occult blood was negative.  Anemia panel surprisingly does not show any deficiencies.  Check TSH.  Since she is  symptomatic we will go ahead and transfuse her 2 units of PRBC.  Lasix will be given in between the units.  Recheck labs tomorrow after transfusion.  #2. Thrombocytopenia: Etiology unclear.  Has had some degree of thrombocytopenia in the past.  The lowest value is of 99,000 back in October.  She has not noticed any bleeding.  No petechiae or purpura.  No splenomegaly or hepatomegaly noted on examination.  We will check LDH.  We will request peripheral smear examination.  Medications reviewed.  She is on Plavix which can sometimes cause TTP.  She is also noted to be on Keppra which can also cause thrombocytopenia.  Follow-up on LDH.  Recheck labs tomorrow.  If values are stable she may benefit from referral to hematology for further evaluation in the outpatient setting.  #3. Essential hypertension: Continue with home medications.  #4. History of TIA: No neurological deficits currently.  Continue home medications.  Reasonable to resume patient's Plavix in the morning.  #5. History of vascular dementia: Continue memantine.  #6. History of seizure disorder: Continue Keppra for now as she has been on this for a long time.   DVT Prophylaxis: SCDs Code Status: CODE STATUS discussed with the patient.  DNR. Family Communication: Discussed with patient.  No family at bedside Disposition: [facility] after transfusion Consults called: None at this time Admission Status: Status is: Observation The patient remains OBS appropriate and will d/c before 2 midnights.    Severity of Illness: The appropriate patient status for this patient is OBSERVATION. Observation status is judged to be reasonable and necessary in order to provide the required intensity of service to ensure the patient's safety. The patient's presenting symptoms, physical exam findings, and initial radiographic and laboratory data in the context of their medical condition is felt to place them at decreased risk  for further clinical deterioration. Furthermore, it is anticipated that the patient will be medically stable for discharge from the hospital within 2 midnights of admission.    Further management decisions will depend on results of further testing and patient's response to treatment.   Sarah Hampton [facility]  [facility] on [email or web address]  09/16/2021, 4:17 PM

## 2021-09-16 NOTE — ED Triage Notes (Addendum)
Reports having routine blood work at PCP. Results came in today and was told her hemoglobin was 6.3 and to come to ED for blood transfusion.   Reports she has been feeling tired and fatigued X2 months.   Denies sob or chest pain.   A/ox4   Wheelchair in triage. Pt uses walker at home.   Reports fall this past Saturday week. Denies hitting her head or trauma. Denies syncope.

## 2021-09-17 ENCOUNTER — Observation Stay (HOSPITAL_COMMUNITY): Payer: Medicare HMO

## 2021-09-17 ENCOUNTER — Telehealth: Payer: Self-pay | Admitting: Physician Assistant

## 2021-09-17 DIAGNOSIS — D696 Thrombocytopenia, unspecified: Secondary | ICD-10-CM

## 2021-09-17 DIAGNOSIS — E871 Hypo-osmolality and hyponatremia: Secondary | ICD-10-CM

## 2021-09-17 DIAGNOSIS — D539 Nutritional anemia, unspecified: Secondary | ICD-10-CM | POA: Diagnosis not present

## 2021-09-17 DIAGNOSIS — N1831 Chronic kidney disease, stage 3a: Secondary | ICD-10-CM | POA: Diagnosis not present

## 2021-09-17 DIAGNOSIS — Z87898 Personal history of other specified conditions: Secondary | ICD-10-CM | POA: Diagnosis not present

## 2021-09-17 DIAGNOSIS — I10 Essential (primary) hypertension: Secondary | ICD-10-CM | POA: Diagnosis not present

## 2021-09-17 DIAGNOSIS — Z8673 Personal history of transient ischemic attack (TIA), and cerebral infarction without residual deficits: Secondary | ICD-10-CM | POA: Diagnosis not present

## 2021-09-17 DIAGNOSIS — R140 Abdominal distension (gaseous): Secondary | ICD-10-CM | POA: Diagnosis not present

## 2021-09-17 DIAGNOSIS — D649 Anemia, unspecified: Secondary | ICD-10-CM | POA: Diagnosis not present

## 2021-09-17 DIAGNOSIS — I700 Atherosclerosis of aorta: Secondary | ICD-10-CM | POA: Diagnosis not present

## 2021-09-17 DIAGNOSIS — M160 Bilateral primary osteoarthritis of hip: Secondary | ICD-10-CM | POA: Diagnosis not present

## 2021-09-17 DIAGNOSIS — M419 Scoliosis, unspecified: Secondary | ICD-10-CM | POA: Diagnosis not present

## 2021-09-17 LAB — TYPE AND SCREEN
ABO/RH(D): A NEG
Antibody Screen: NEGATIVE
Unit division: 0
Unit division: 0

## 2021-09-17 LAB — BPAM RBC
Blood Product Expiration Date: 202307102359
Blood Product Expiration Date: 202307132359
ISSUE DATE / TIME: 202306271824
ISSUE DATE / TIME: 202306272242
Unit Type and Rh: 600
Unit Type and Rh: 600

## 2021-09-17 LAB — COMPREHENSIVE METABOLIC PANEL
ALT: 19 U/L (ref 0–44)
AST: 27 U/L (ref 15–41)
Albumin: 4.1 g/dL (ref 3.5–5.0)
Alkaline Phosphatase: 133 U/L — ABNORMAL HIGH (ref 38–126)
Anion gap: 9 (ref 5–15)
BUN: 25 mg/dL — ABNORMAL HIGH (ref 8–23)
CO2: 23 mmol/L (ref 22–32)
Calcium: 8.8 mg/dL — ABNORMAL LOW (ref 8.9–10.3)
Chloride: 105 mmol/L (ref 98–111)
Creatinine, Ser: 1 mg/dL (ref 0.44–1.00)
GFR, Estimated: 54 mL/min — ABNORMAL LOW (ref >60)
Glucose, Bld: 106 mg/dL — ABNORMAL HIGH (ref 70–99)
Potassium: 4.3 mmol/L (ref 3.5–5.1)
Sodium: 137 mmol/L (ref 135–145)
Total Bilirubin: 2.5 mg/dL — ABNORMAL HIGH (ref 0.3–1.2)
Total Protein: 6.4 g/dL — ABNORMAL LOW (ref 6.5–8.1)

## 2021-09-17 LAB — CBC
HCT: 31.1 % — ABNORMAL LOW (ref 36.0–46.0)
Hemoglobin: 10.1 g/dL — ABNORMAL LOW (ref 12.0–15.0)
MCH: 32.8 pg (ref 26.0–34.0)
MCHC: 32.5 g/dL (ref 30.0–36.0)
MCV: 101 fL — ABNORMAL HIGH (ref 80.0–100.0)
Platelets: 77 10*3/uL — ABNORMAL LOW (ref 150–400)
RBC: 3.08 MIL/uL — ABNORMAL LOW (ref 3.87–5.11)
RDW: 24.4 % — ABNORMAL HIGH (ref 11.5–15.5)
WBC: 6.3 10*3/uL (ref 4.0–10.5)
nRBC: 3.5 % — ABNORMAL HIGH (ref 0.0–0.2)

## 2021-09-17 LAB — PROTIME-INR
INR: 1.1 (ref 0.8–1.2)
Prothrombin Time: 14.1 seconds (ref 11.4–15.2)

## 2021-09-17 LAB — BILIRUBIN, FRACTIONATED(TOT/DIR/INDIR)
Bilirubin, Direct: 0.4 mg/dL — ABNORMAL HIGH (ref 0.0–0.2)
Indirect Bilirubin: 1.8 mg/dL — ABNORMAL HIGH (ref 0.3–0.9)
Total Bilirubin: 2.2 mg/dL — ABNORMAL HIGH (ref 0.3–1.2)

## 2021-09-17 LAB — TSH: TSH: 2.287 u[IU]/mL (ref 0.350–4.500)

## 2021-09-17 LAB — LACTATE DEHYDROGENASE: LDH: 526 U/L — ABNORMAL HIGH (ref 98–192)

## 2021-09-17 MED ORDER — POLYETHYLENE GLYCOL 3350 17 GM/SCOOP PO POWD: 17.0000 g | Freq: Two times a day (BID) | ORAL | 0 refills | Status: DC | PRN

## 2021-09-17 MED ORDER — SENNOSIDES-DOCUSATE SODIUM 8.6-50 MG PO TABS: 1.0000 | ORAL_TABLET | Freq: Two times a day (BID) | ORAL | 0 refills | Status: DC | PRN

## 2021-09-17 NOTE — Discharge Summary (Signed)
Physician Discharge Summary  Sarah Hampton ZOX:[id number] DOB: 1931-03-03 DOA: 09/16/2021  PCP: Lauree Chandler, NP  Admit date: 09/16/2021 Discharge date: 09/17/2021 Admitted From: ALF Disposition: ALF Recommendations for Outpatient Follow-up:  Follow up with PCP in 1 week Please obtain CBC and CMP in 1 week Ensure outpatient follow-up with hematology/oncology.  Ambulatory referral ordered. Please follow up on the following pending results: Haptoglobin and blood smear  Home Health: None Equipment/Devices: None  Discharge Condition: Stable CODE STATUS: DNR/DNI  Follow-up Information     Lauree Chandler, NP. Schedule an appointment as soon as possible for a visit in 1 week(s).   Specialty: Geriatric Medicine Contact information: [address]. [place] [address] [phone]                 Hospital course 88 year old F with PMH of TIA, vascular dementia, HTN, melanoma, squamous cell carcinoma of skin, anemia, thrombocytopenia and seizure disorder directed to ED by PCP for low hemoglobin to 6.3.  She also had some fatigue and dyspnea on exertion.  Denies melena, hematochezia or signs of bleeding anywhere.  In ED, she was hemodynamically stable.  Afebrile with normal saturation.  CMP with mild hyponatremia to 134 and mild hyperglycemia at 135.  Otherwise, within normal.  Hgb 6.2 (baseline 8-9)..  MCV 116.  Platelet 80 (lower than baseline).  Hemoccult negative.  Anemia panel including Y78 and folic acid within normal.  PT/INR within normal.  LDH elevated to 526 raising concern for hemolytic process.   Patient was transfused 2 units.  Hemoglobin improved to 10.1.  Platelets remained low but stable at 77.  Haptoglobin and blood smear ordered and pending.  Patient's symptoms improved.  Bilirubin trended from 1.0-2.5 with indirect dominance the next day.  Otherwise, CMP remained stable.  She had no GI symptoms, splenomegaly or hepatomegaly on exam.  She is discharged  for follow-up with hematology/oncology for further evaluation of anemia and thrombocytopenia.  Case discussed with patient's daughter at bedside.  See individual problem list below for more.   Problems addressed during this hospitalization Principal Problem:   Symptomatic anemia Active Problems:   Essential hypertension   Chronic kidney disease, stage 3a (HCC)   Macrocytic anemia   History of TIA (transient ischemic attack)   Thrombocytopenia (HCC)   History of seizure   Hyperbilirubinemia   Hyponatremia              Vital signs Vitals:   09/16/21 2305 09/17/21 0125 09/17/21 0607 09/17/21 1000  BP: (!) 149/53 (!) 154/58 (!) 126/53 (!) 131/49  Pulse: 92 97 82 76  Temp: 99.7 F (37.6 C) 98.8 F (37.1 C) 98.1 F (36.7 C) 98.2 F (36.8 C)  Resp: '18 17 17 18  '$ SpO2: 98% 99% 99% 100%  TempSrc: Oral Oral Oral Oral     Discharge exam  GENERAL: No apparent distress.  Nontoxic. HEENT: MMM.  Vision and hearing grossly intact.  NECK: Supple.  No apparent JVD.  RESP:  No IWOB.  Fair aeration bilaterally. CVS:  RRR. Heart sounds normal.  ABD/GI/GU: BS+.  Slight abdominal distention.  Nontender. MSK/EXT:  Moves extremities. No apparent deformity. No edema.  Scoliosis. SKIN: no apparent skin lesion or wound NEURO: Awake and alert. Oriented fairly.  No apparent focal neuro deficit. PSYCH: Calm. Normal affect.   Discharge Instructions Discharge Instructions     Ambulatory referral to Hematology / Oncology   Complete by: As directed    Anemia and thrombocytopenia   Call  MD for:   Complete by: As directed    Blood in the stool or dark tarry looking stool   Call MD for:  difficulty breathing, headache or visual disturbances   Complete by: As directed    Call MD for:  extreme fatigue   Complete by: As directed    Call MD for:  persistant dizziness or light-headedness   Complete by: As directed    Diet general   Complete by: As directed    Discharge instructions    Complete by: As directed    It has been a pleasure taking care of you!  You were hospitalized due to low hemoglobin (anemia).  You will receive blood transfusion and your hemoglobin improved.  We sent referral to hematology and oncology for further evaluation for anemia and thrombocytopenia (low platelet).  Someone will get in touch with you about this referral in the next few days.  Follow-up with your primary care doctor in 1 week or sooner if needed to have your blood levels rechecked.   Take care,   Increase activity slowly   Complete by: As directed       Allergies as of 09/17/2021   No Known Allergies      Medication List     TAKE these medications    acetaminophen 325 MG tablet Commonly known as: TYLENOL Take 650 mg by mouth every 6 (six) hours as needed for moderate pain.   atorvastatin 80 MG tablet Commonly known as: LIPITOR TAKE 1 TABLET EVERY DAY  AT  6PM What changed: See the new instructions.   Calcium Carbonate-Vitamin D 600-400 MG-UNIT tablet Take 1 tablet by mouth 2 (two) times daily.   clopidogrel 75 MG tablet Commonly known as: PLAVIX TAKE 1 TABLET EVERY DAY   fluticasone 50 MCG/ACT nasal spray Commonly known as: FLONASE Place 1 spray into both nostrils daily as needed (seasonal allergies).   levETIRAcetam 250 MG tablet Commonly known as: Keppra Take 1 tablet (250 mg total) by mouth 2 (two) times daily.   lisinopril 20 MG tablet Commonly known as: ZESTRIL TAKE 1 TABLET EVERY DAY   memantine 5 MG tablet Commonly known as: Namenda Take 1 tablet (5 mg total) by mouth 2 (two) times daily.   polyethylene glycol powder 17 GM/SCOOP powder Commonly known as: MiraLax Take 17 g by mouth 2 (two) times daily as needed for mild constipation.   senna-docusate 8.6-50 MG tablet Commonly known as: Senokot-S Take 1 tablet by mouth 2 (two) times daily between meals as needed for moderate constipation.   vitamin B-12 1000 MCG tablet Commonly known as:  CYANOCOBALAMIN Take 1 tablet (1,000 mcg total) by mouth daily.        Consultations: None  Procedures/Studies: None   DG Abd Portable 1V  Result Date: 09/17/2021 CLINICAL DATA:  Abdominal distension EXAM: PORTABLE ABDOMEN - 1 VIEW COMPARISON:  03/10/2021 chest x-ray FINDINGS: Nonobstructive bowel gas pattern with air throughout the small bowel and into the right colon. No significant dilatation pattern or ileus. Severe degenerative changes of the visualized spine with dextroscoliosis. Degenerative changes of the hips as well. Bones are osteopenic. Aorta iliac atherosclerosis noted. Normal heart size.  Lung bases clear. IMPRESSION: Nonobstructive bowel gas pattern.  Chronic findings as above. Electronically Signed   By: Jerilynn Mages.  Shick M.D.   On: 09/17/2021 10:53       The results of significant diagnostics from this hospitalization (including imaging, microbiology, ancillary and laboratory) are listed below for reference.  Microbiology: No results found for this or any previous visit (from the past 240 hour(s)).   Labs:  CBC: Recent Labs  Lab 09/15/21 1544 09/16/21 1341 09/17/21 0443  WBC 5.4 4.7 6.3  NEUTROABS 3,532  --   --   HGB 6.3* 6.2* 10.1*  HCT 20.0* 20.8* 31.1*  MCV 111.1* 116.2* 101.0*  PLT 88* 80* 77*   BMP &GFR Recent Labs  Lab 09/15/21 1544 09/16/21 1341 09/17/21 0443  NA 133* 134* 137  K 4.6 4.3 4.3  CL 100 101 105  CO2 '26 26 23  '$ GLUCOSE 95 135* 106*  BUN 20 22 25*  CREATININE 0.90 0.91 1.00  CALCIUM 8.9 9.2 8.8*   Estimated Creatinine Clearance: 26 mL/min (by C-G formula based on SCr of 1 mg/dL). Liver & Pancreas: Recent Labs  Lab 09/15/21 1544 09/16/21 1341 09/17/21 0443  AST '21 22 27  '$ ALT '19 21 19  '$ ALKPHOS  --  126 133*  BILITOT 1.0 1.0 2.2*  2.5*  PROT 5.9* 6.4* 6.4*  ALBUMIN  --  4.2 4.1   No results for input(s): "LIPASE", "AMYLASE" in the last 168 hours. No results for input(s): "AMMONIA" in the last 168 hours. Diabetic: No  results for input(s): "HGBA1C" in the last 72 hours. No results for input(s): "GLUCAP" in the last 168 hours. Cardiac Enzymes: No results for input(s): "CKTOTAL", "CKMB", "CKMBINDEX", "TROPONINI" in the last 168 hours. No results for input(s): "PROBNP" in the last 8760 hours. Coagulation Profile: Recent Labs  Lab 09/17/21 0443  INR 1.1   Thyroid Function Tests: Recent Labs    09/17/21 0443  TSH 2.287   Lipid Profile: No results for input(s): "CHOL", "HDL", "LDLCALC", "TRIG", "CHOLHDL", "LDLDIRECT" in the last 72 hours. Anemia Panel: Recent Labs    09/16/21 1341  VITAMINB12 626  FOLATE 7.0  FERRITIN 104  TIBC 273  IRON 79  RETICCTPCT 5.9*   Urine analysis:    Component Value Date/Time   COLORURINE YELLOW 04/16/2020 0441   APPEARANCEUR CLEAR 04/16/2020 0441   LABSPEC 1.028 04/16/2020 0441   PHURINE 7.0 04/16/2020 0441   GLUCOSEU NEGATIVE 04/16/2020 0441   HGBUR NEGATIVE 04/16/2020 0441   BILIRUBINUR NEGATIVE 04/16/2020 0441   KETONESUR NEGATIVE 04/16/2020 0441   PROTEINUR NEGATIVE 04/16/2020 0441   NITRITE NEGATIVE 04/16/2020 0441   LEUKOCYTESUR NEGATIVE 04/16/2020 0441   Sepsis Labs: Invalid input(s): "PROCALCITONIN", "LACTICIDVEN"   SIGNED:  Mercy Riding, MD  [facility] 09/17/2021, 12:27 PM

## 2021-09-17 NOTE — NC FL2 (Signed)
[place] LEVEL OF CARE SCREENING TOOL     IDENTIFICATION  Patient Name: Sarah Hampton Birthdate: 1930-07-20 Sex: female Admission Date (Current Location): 09/16/2021  [facility] and [place] Number:  Herbalist and Address:  [facility],  [address], [place]      Provider Number: [id number]  Attending Physician Name and Address:  Mercy Riding, MD  Relative Name and Phone Number:  Eulas Post (daughter) Ph: [phone]    Current Level of Care: Hospital Recommended Level of Care: Assisted Living Facility Prior Approval Number:    Date Approved/Denied:   PASRR Number:    Discharge Plan: Other (Comment) ([facility])    Current Diagnoses: Patient Active Problem List   Diagnosis Date Noted   Thrombocytopenia ([place]) 09/16/2021   Symptomatic anemia 09/16/2021   Macrocytic anemia 04/15/2020   Leukocytosis 04/15/2020   History of TIA (transient ischemic attack) 04/15/2020   Other hyperlipidemia 11/12/2017   TIA (transient ischemic attack) 09/28/2017   Essential hypertension 09/28/2017   CKD (chronic kidney disease), stage III ([place]) 09/28/2017   Arthritis 08/24/2017   Squamous cell carcinoma 08/24/2017   Widowed 08/24/2017   Personal history of malignant neoplasm of other organs and systems 10/16/2016    Orientation RESPIRATION BLADDER Height & Weight     Self, Time, Situation, Place  Normal Continent Weight:   Height:     BEHAVIORAL SYMPTOMS/MOOD NEUROLOGICAL BOWEL NUTRITION STATUS   (N/A)  (N/A) Continent Diet (Diet 2 gram sodium)  AMBULATORY STATUS COMMUNICATION OF NEEDS Skin   Limited Assist Verbally Other (Comment) (Ecchymosis: right arm & leg; Stitches: right thigh)                       Personal Care Assistance Level of Assistance  Bathing, Feeding, Dressing Bathing Assistance: Limited assistance Feeding assistance: Independent Dressing Assistance: Limited assistance     Functional Limitations  Info  Sight, Hearing, Speech Sight Info: Impaired Hearing Info: Impaired (Patient uses hearing aid.) Speech Info: Adequate    SPECIAL CARE FACTORS FREQUENCY                       Contractures Contractures Info: Not present    Additional Factors Info  Code Status, Allergies Code Status Info: DNR Allergies Info: NKA           Current Medications (09/17/2021):  This is the current hospital active medication list Current Facility-Administered Medications  Medication Dose Route Frequency Provider Last Rate Last Admin   acetaminophen (TYLENOL) tablet 650 mg  650 mg Oral Q6H PRN Bonnielee Haff, MD       Or   acetaminophen (TYLENOL) suppository 650 mg  650 mg Rectal Q6H PRN Bonnielee Haff, MD       atorvastatin (LIPITOR) tablet 80 mg  80 mg Oral Daily Bonnielee Haff, MD   80 mg at 09/16/21 2130   clopidogrel (PLAVIX) tablet 75 mg  75 mg Oral Daily Bonnielee Haff, MD       levETIRAcetam (KEPPRA) tablet 250 mg  250 mg Oral BID Bonnielee Haff, MD   250 mg at 09/16/21 2130   memantine (NAMENDA) tablet 5 mg  5 mg Oral BID Bonnielee Haff, MD   5 mg at 09/16/21 2129   ondansetron (ZOFRAN) tablet 4 mg  4 mg Oral Q6H PRN Bonnielee Haff, MD       Or   ondansetron [facility]) injection 4 mg  4 mg Intravenous Q6H  PRN Bonnielee Haff, MD       vitamin B-12 (CYANOCOBALAMIN) tablet 1,000 mcg  1,000 mcg Oral Daily Bonnielee Haff, MD         Discharge Medications: Please see discharge summary for a list of discharge medications.  Relevant Imaging Results:  Relevant Lab Results:   Additional Information SSN: [id number]  Sherie Don, LCSW

## 2021-09-17 NOTE — Telephone Encounter (Signed)
Scheduled appt per 6/28 referral. Pt's daughter is aware of appt date and time. Pt's daughter is aware to arrive 15 mins prior to appt time and to bring and updated insurance card. Pt's daughter is aware of appt location.

## 2021-09-17 NOTE — Progress Notes (Signed)
Patient and patient's daughter was given discharge instructions, and all questions were answered. Patient was stable for discharge and was taken to the main exit by wheelchair. 

## 2021-09-17 NOTE — TOC Transition Note (Signed)
Transition of Care [facility]) - CM/SW Discharge Note  Patient Details  Name: Sarah Hampton MRN: [id number] Date of Birth: [birth year]  Transition of Care [facility]) CM/SW Contact:  Sherie Don, LCSW Phone Number: 09/17/2021, 1:07 PM  Clinical Narrative: [facility] consulted as patient is a resident of [facility]. CSW spoke with daughter, Eulas Post, to confirm patient resides in the ALF. CSW spoke with Marcelline Mates at [facility] regarding the paperwork patient will need prior to discharging back to the facility. CSW faxed discharge summary, FL2, and labs to [facility] [phone]). TOC signing off.  Final next level of care: Assisted Living Barriers to Discharge: No Barriers Identified  Patient Goals and CMS Choice Patient states their goals for this hospitalization and ongoing recovery are:: Return to [facility]F Choice offered to / list presented to : NA  Discharge Plan and Services         DME Arranged: N/A DME Agency: NA  Readmission Risk Interventions     No data to display

## 2021-09-18 LAB — HAPTOGLOBIN: Haptoglobin: 69 mg/dL (ref 41–333)

## 2021-09-19 ENCOUNTER — Ambulatory Visit: Payer: Medicare HMO | Admitting: Nurse Practitioner

## 2021-09-21 NOTE — Progress Notes (Signed)
  Subjective:  Patient ID: Sarah Hampton, female    DOB: May 11, 1930,  MRN: [id number]  88 y.o. female presents painful elongated mycotic toenails 1-5 bilaterally which are tender when wearing enclosed shoe gear. Pain is relieved with periodic professional debridement.  New problem(s): None   PCP is Lauree Chandler, NP , and last visit was 09/16/2021.  No Known Allergies  Review of Systems: Negative except as noted in the HPI.   Objective:  Vascular Examination: CFT <3 seconds b/l LE. Palpable DP pulse(s) b/l LE. Faintly palpable PT pulse(s) b/l LE. Pedal hair absent. No pain with calf compression b/l. Lower extremity skin temperature gradient within normal limits. No edema noted b/l LE. No cyanosis or clubbing noted b/l LE.  Neurological Examination: Sensation grossly intact b/l with 10 gram monofilament. Vibratory sensation intact b/l.   Dermatological Examination: Pedal skin thin, shiny and atrophic b/l LE.  No open wounds b/l. No interdigital macerations b/l. Toenails 1-5 b/l elongated, thickened, discolored with subungual debris. +Tenderness with dorsal palpation of nailplates. No hyperkeratotic or porokeratotic lesions present.  Musculoskeletal Examination: Muscle strength 5/5 to all lower extremity muscle groups bilaterally. Hammertoe deformity noted 2-5 b/l.  Radiographs: None  Assessment:   1. Pain due to onychomycosis of toenails of both feet    Plan:  -Patient was evaluated and treated. All patient's and/or POA's questions/concerns answered on today's visit. -Patient to continue soft, supportive shoe gear daily. -Mycotic toenails 1-5 bilaterally were debrided in length and girth with sterile nail nippers and dremel without incident. -Patient/POA to call should there be question/concern in the interim.  Return in about 3 months (around 12/13/2021).  Marzetta Board, DPM

## 2021-09-22 ENCOUNTER — Encounter: Payer: Self-pay | Admitting: Nurse Practitioner

## 2021-09-22 ENCOUNTER — Ambulatory Visit (INDEPENDENT_AMBULATORY_CARE_PROVIDER_SITE_OTHER): Payer: Medicare HMO | Admitting: Nurse Practitioner

## 2021-09-22 VITALS — BP 118/70 | HR 81 | Temp 97.1°F | Ht 59.0 in

## 2021-09-22 DIAGNOSIS — R634 Abnormal weight loss: Secondary | ICD-10-CM | POA: Diagnosis not present

## 2021-09-22 DIAGNOSIS — D649 Anemia, unspecified: Secondary | ICD-10-CM

## 2021-09-22 DIAGNOSIS — N1831 Chronic kidney disease, stage 3a: Secondary | ICD-10-CM | POA: Diagnosis not present

## 2021-09-22 DIAGNOSIS — R2681 Unsteadiness on feet: Secondary | ICD-10-CM | POA: Diagnosis not present

## 2021-09-22 DIAGNOSIS — Z66 Do not resuscitate: Secondary | ICD-10-CM | POA: Diagnosis not present

## 2021-09-22 MED ORDER — ENSURE ENLIVE PO LIQD: 237.0000 mL | Freq: Every day | ORAL | 0 refills | Status: DC

## 2021-09-22 NOTE — Patient Instructions (Signed)
She is allowed to have antacid and imodium in her room and okay to self administer as needed

## 2021-09-22 NOTE — Progress Notes (Signed)
Careteam: Patient Care Team: Lauree Chandler, NP as PCP - General (Geriatric Medicine) Ngetich, Nelda Bucks, NP as Nurse Practitioner (Family Medicine)  PLACE OF SERVICE:  [place] Directive information Does Patient Have a Medical Advance Directive?: Yes, Type of Advance Directive: Healthcare Power of [place];Living will;Out of facility DNR (pink MOST or yellow form), Pre-existing out of facility DNR order (yellow form or pink MOST form): Yellow form placed in chart (order not valid for inpatient use);Pink MOST form placed in chart (order not valid for inpatient use), Does patient want to make changes to medical advance directive?: No - Patient declined  No Known Allergies  Chief Complaint  Patient presents with   Transitions Of Care    Follow-up from recent hospital stay 09/16/21-09/17/21 for symptomatic anemia. Order request for Imodium, ensure and OTC antiacid to self administer at facility [place]. Here with daughter Bruna Potter.      HPI: Patient is a 88 y.o. female for hospital follow up.  Pt with hx of TIA, vascular dementia, HTN, melanoma, squamous cell carcinoma of skin, anemia, thrombocytopenia and seizure disorder. She was sent to ED due to low hgb of 6.3, she was transfused 2 units and hgb improved to 10.1. noted to have low platelet count but remained stable. No signs of acute blood loss. Referral placed for hematology referral and appt made for 7/19.   Feeling better but not steady. She is having physical therapy twice weekly.  Having trouble with appetite.   Reports she has a nervous stomach and she will take an imodium. She has it on hand and has used it PRN   Also would take occasionally antacid due to GERD.   Review of Systems:  Review of Systems  Constitutional:  Negative for chills, fever and weight loss.  HENT:  Negative for tinnitus.   Respiratory:  Negative for cough, sputum production and shortness of breath.   Cardiovascular:  Negative for chest pain,  palpitations and leg swelling.  Gastrointestinal:  Negative for abdominal pain, constipation, diarrhea and heartburn.  Genitourinary:  Negative for dysuria, frequency and urgency.  Musculoskeletal:  Positive for myalgias. Negative for back pain, falls and joint pain.  Skin: Negative.   Neurological:  Positive for weakness. Negative for dizziness and headaches.  Psychiatric/Behavioral:  Positive for memory loss. Negative for depression. The patient does not have insomnia.     Past Medical History:  Diagnosis Date   Arthritis    History of melanoma    shoulder   History of squamous cell carcinoma    History of TIA (transient ischemic attack)    Hypertension    Macrocytic anemia    Mini stroke    Per [place] New Patient Packet    Other hyperlipidemia    Personal history of malignant neoplasm of other organs and systems    Seizure [facility])    Past Surgical History:  Procedure Laterality Date   CATARACT EXTRACTION     DENTAL SURGERY     SKIN SURGERY  07/2018   SQUAMOUS CELL CARCINOMA EXCISION  09/10/2021   TONSILLECTOMY  1946   Per [place] New Patient Packet    Social History:   reports that she quit smoking about 53 years ago. Her smoking use included cigarettes. She has a 15.00 pack-year smoking history. She has never used smokeless tobacco. She reports that she does not currently use alcohol. She reports that she does not use drugs.  Family History  Problem Relation Age of Onset  Dementia Mother 34   Breast cancer Mother    Pneumonia Father 7   Cancer Father    Thyroid disease Daughter    Thyroid disease Daughter    Stroke Neg Hx     Medications: Patient's Medications  New Prescriptions   No medications on file  Previous Medications   ACETAMINOPHEN (TYLENOL) 325 MG TABLET    Take 650 mg by mouth every 6 (six) hours as needed for moderate pain.   ATORVASTATIN (LIPITOR) 80 MG TABLET    TAKE 1 TABLET EVERY DAY  AT  6PM   CALCIUM CARBONATE-VITAMIN D 600-400 MG-UNIT TABLET     Take 1 tablet by mouth 2 (two) times daily.   CLOPIDOGREL (PLAVIX) 75 MG TABLET    TAKE 1 TABLET EVERY DAY   FLUTICASONE (FLONASE) 50 MCG/ACT NASAL SPRAY    Place 1 spray into both nostrils daily as needed (seasonal allergies).   LEVETIRACETAM (KEPPRA) 250 MG TABLET    Take 1 tablet (250 mg total) by mouth 2 (two) times daily.   LISINOPRIL (ZESTRIL) 20 MG TABLET    TAKE 1 TABLET EVERY DAY   LOPERAMIDE (IMODIUM) 2 MG CAPSULE    Take 4 mg by mouth as needed for diarrhea or loose stools.   MEMANTINE (NAMENDA) 5 MG TABLET    Take 1 tablet (5 mg total) by mouth 2 (two) times daily.   VITAMIN B-12 (CYANOCOBALAMIN) 1000 MCG TABLET    Take 1 tablet (1,000 mcg total) by mouth daily.  Modified Medications   No medications on file  Discontinued Medications   POLYETHYLENE GLYCOL POWDER (MIRALAX) 17 GM/SCOOP POWDER    Take 17 g by mouth 2 (two) times daily as needed for mild constipation.   SENNA-DOCUSATE (SENOKOT-S) 8.6-50 MG TABLET    Take 1 tablet by mouth 2 (two) times daily between meals as needed for moderate constipation.    Physical Exam:  Vitals:   09/22/21 1032  BP: 118/70  Pulse: 81  Temp: (!) 97.1 F (36.2 C)  TempSrc: Temporal  SpO2: 99%  Height: 4' 11"  (1.499 m)   Body mass index is 19.55 kg/m. Wt Readings from Last 3 Encounters:  09/15/21 96 lb 12.8 oz (43.9 kg)  05/16/21 99 lb 6.4 oz (45.1 kg)  03/16/21 100 lb 5 oz (45.5 kg)    Physical Exam Constitutional:      General: She is not in acute distress.    Appearance: She is well-developed. She is not diaphoretic.     Comments: thin  HENT:     Head: Normocephalic and atraumatic.     Mouth/Throat:     Pharynx: No oropharyngeal exudate.  Eyes:     Conjunctiva/sclera: Conjunctivae normal.     Pupils: Pupils are equal, round, and reactive to light.  Cardiovascular:     Rate and Rhythm: Normal rate and regular rhythm.     Heart sounds: Normal heart sounds.  Pulmonary:     Effort: Pulmonary effort is normal.     Breath  sounds: Normal breath sounds.  Abdominal:     General: Bowel sounds are normal.     Palpations: Abdomen is soft.  Musculoskeletal:     Cervical back: Normal range of motion and neck supple.     Right lower leg: No edema.     Left lower leg: No edema.  Skin:    General: Skin is warm and dry.  Neurological:     Mental Status: She is alert.  Psychiatric:  Mood and Affect: Mood normal.     Labs reviewed: Basic Metabolic Panel: Recent Labs    12/25/20 1351 03/16/21 2043 09/15/21 1544 09/16/21 1341 09/17/21 0443  NA 136   < > 133* 134* 137  K 4.7   < > 4.6 4.3 4.3  CL 101   < > 100 101 105  CO2 25   < > 26 26 23   GLUCOSE 96   < > 95 135* 106*  BUN 21   < > 20 22 25*  CREATININE 1.17*   < > 0.90 0.91 1.00  CALCIUM 9.0   < > 8.9 9.2 8.8*  TSH 1.87  --   --   --  2.287   < > = values in this interval not displayed.   Liver Function Tests: Recent Labs    03/16/21 2043 05/16/21 1552 09/15/21 1544 09/16/21 1341 09/17/21 0443  AST 25   < > 21 22 27   ALT 21   < > 19 21 19   ALKPHOS 143*  --   --  126 133*  BILITOT 0.8   < > 1.0 1.0 2.2*  2.5*  PROT 6.3*   < > 5.9* 6.4* 6.4*  ALBUMIN 4.1  --   --  4.2 4.1   < > = values in this interval not displayed.   No results for input(s): "LIPASE", "AMYLASE" in the last 8760 hours. No results for input(s): "AMMONIA" in the last 8760 hours. CBC: Recent Labs    03/10/21 1544 03/16/21 2043 09/15/21 1544 09/16/21 1341 09/17/21 0443  WBC 14.3* 11.4* 5.4 4.7 6.3  NEUTROABS 10,940* 7.2 3,532  --   --   HGB 8.3* 8.8* 6.3* 6.2* 10.1*  HCT 26.1* 27.9* 20.0* 20.8* 31.1*  MCV 107.4* 109.8* 111.1* 116.2* 101.0*  PLT 179 150 88* 80* 77*   Lipid Panel: Recent Labs    03/10/21 1544  CHOL 94  HDL 39*  LDLCALC 35  TRIG 122  CHOLHDL 2.4   TSH: Recent Labs    12/25/20 1351 09/17/21 0443  TSH 1.87 2.287   A1C: Lab Results  Component Value Date   HGBA1C 5.5 04/16/2020     Assessment/Plan 1. Symptomatic  anemia -transfused 2 units during hospitalization. No signs of acute blood loss.  - CBC with Differential/Platelet  2. Do not resuscitate - Do not attempt resuscitation (DNR)  3. Stage 3a chronic kidney disease ([place]) Chronic and stable Encourage proper hydration Follow metabolic panel Avoid nephrotoxic meds (NSAIDS) - CMP with eGFR([facility])  4. Weight loss -encouraged to liberalize diet. To have protein supplement in addition to smallest meal of the day. - feeding supplement (ENSURE ENLIVE / ENSURE PLUS) LIQD; Take 237 mLs by mouth daily after lunch.  Dispense: 237 mL; Refill: 0  5. Unsteady gait -continues to work with PT, encouraged proper nutrition as well.    Carlos American. [place], [facility] [phone]

## 2021-09-23 LAB — CBC WITH DIFFERENTIAL/PLATELET
Absolute Monocytes: 404 cells/uL (ref 200–950)
Basophils Absolute: 60 cells/uL (ref 0–200)
Basophils Relative: 1.4 %
Eosinophils Absolute: 82 cells/uL (ref 15–500)
Eosinophils Relative: 1.9 %
HCT: 31 % — ABNORMAL LOW (ref 35.0–45.0)
Hemoglobin: 9.9 g/dL — ABNORMAL LOW (ref 11.7–15.5)
Lymphs Abs: 804 cells/uL — ABNORMAL LOW (ref 850–3900)
MCH: 32.5 pg (ref 27.0–33.0)
MCHC: 31.9 g/dL — ABNORMAL LOW (ref 32.0–36.0)
MCV: 101.6 fL — ABNORMAL HIGH (ref 80.0–100.0)
Monocytes Relative: 9.4 %
Neutro Abs: 2950 cells/uL (ref 1500–7800)
Neutrophils Relative %: 68.6 %
Platelets: 61 10*3/uL — ABNORMAL LOW (ref 140–400)
RBC: 3.05 10*6/uL — ABNORMAL LOW (ref 3.80–5.10)
RDW: 20.6 % — ABNORMAL HIGH (ref 11.0–15.0)
Total Lymphocyte: 18.7 %
WBC: 4.3 10*3/uL (ref 3.8–10.8)

## 2021-09-23 LAB — COMPLETE METABOLIC PANEL WITH GFR
AG Ratio: 2.6 (calc) — ABNORMAL HIGH (ref 1.0–2.5)
ALT: 15 U/L (ref 6–29)
AST: 18 U/L (ref 10–35)
Albumin: 4.4 g/dL (ref 3.6–5.1)
Alkaline phosphatase (APISO): 139 U/L (ref 37–153)
BUN/Creatinine Ratio: 23 (calc) — ABNORMAL HIGH (ref 6–22)
BUN: 23 mg/dL (ref 7–25)
CO2: 28 mmol/L (ref 20–32)
Calcium: 9.5 mg/dL (ref 8.6–10.4)
Chloride: 102 mmol/L (ref 98–110)
Creat: 1.02 mg/dL — ABNORMAL HIGH (ref 0.60–0.95)
Globulin: 1.7 g/dL (calc) — ABNORMAL LOW (ref 1.9–3.7)
Glucose, Bld: 95 mg/dL (ref 65–99)
Potassium: 4.8 mmol/L (ref 3.5–5.3)
Sodium: 136 mmol/L (ref 135–146)
Total Bilirubin: 0.8 mg/dL (ref 0.2–1.2)
Total Protein: 6.1 g/dL (ref 6.1–8.1)
eGFR: 53 mL/min/{1.73_m2} — ABNORMAL LOW (ref >=60)

## 2021-09-24 DIAGNOSIS — R2689 Other abnormalities of gait and mobility: Secondary | ICD-10-CM | POA: Diagnosis not present

## 2021-09-26 DIAGNOSIS — R2689 Other abnormalities of gait and mobility: Secondary | ICD-10-CM | POA: Diagnosis not present

## 2021-09-29 ENCOUNTER — Encounter: Payer: Self-pay | Admitting: Adult Health

## 2021-09-29 ENCOUNTER — Ambulatory Visit (INDEPENDENT_AMBULATORY_CARE_PROVIDER_SITE_OTHER): Payer: Medicare HMO | Admitting: Adult Health

## 2021-09-29 VITALS — BP 100/60 | HR 82 | Temp 97.1°F | Resp 16 | Ht 59.0 in | Wt 95.6 lb

## 2021-09-29 DIAGNOSIS — I9589 Other hypotension: Secondary | ICD-10-CM | POA: Diagnosis not present

## 2021-09-29 DIAGNOSIS — R410 Disorientation, unspecified: Secondary | ICD-10-CM | POA: Diagnosis not present

## 2021-09-29 DIAGNOSIS — M549 Dorsalgia, unspecified: Secondary | ICD-10-CM | POA: Diagnosis not present

## 2021-09-29 DIAGNOSIS — R350 Frequency of micturition: Secondary | ICD-10-CM

## 2021-09-29 DIAGNOSIS — D649 Anemia, unspecified: Secondary | ICD-10-CM

## 2021-09-29 MED ORDER — LISINOPRIL 20 MG PO TABS: 10.0000 mg | ORAL_TABLET | Freq: Every day | ORAL | 2 refills | Status: DC

## 2021-09-29 MED ORDER — LISINOPRIL 20 MG PO TABS
10.0000 mg | ORAL_TABLET | Freq: Every day | ORAL | 2 refills | Status: DC
Start: 2021-09-29 — End: 2021-09-29

## 2021-09-29 NOTE — Progress Notes (Signed)
[facility] clinic  Provider:  Durenda Age  DNP  Code Status: DNR  Goals of Care:     09/29/2021    2:55 PM  Advanced Directives  Does Patient Have a Medical Advance Directive? Yes  Type of Paramedic of [place];Living will;Out of facility DNR (pink MOST or yellow form)  Does patient want to make changes to medical advance directive? No - Patient declined  Copy of [place] in Chart? Yes - validated most recent copy scanned in chart (See row information)     Chief Complaint  Patient presents with   Acute Visit    Patient complains of possible UTI. Symptoms are urinary frequency, back pain, and confusion that started a couple of days ago.    HPI: Patient is a 88 y.o. female seen today for an acute visit for a possible UTI. She presented today at the clinic accompanied by her son-in- law. She complains of increase in urinary frequency, confusion, right lower back pain, unsteady gait X 2 days. She fell yesterday. She lives at [place]. She denies having chills, fever nor hematuria. BP today is 100/62. She takes Lisinopril 20 mg daily. She was recently hospitalized 09/16/21 to 09/17/21 and was treated for symptomatic anemia, hgb 6.2. She was transfused 2 units PRBC. She had thrombocytopenia with platelet of 77. She will follow up at Hematology on 7/19.  Past Medical History:  Diagnosis Date   Arthritis    History of melanoma    shoulder   History of squamous cell carcinoma    History of TIA (transient ischemic attack)    Hypertension    Macrocytic anemia    Mini stroke    Per [place] New Patient Packet    Other hyperlipidemia    Personal history of malignant neoplasm of other organs and systems    Seizure [facility])     Past Surgical History:  Procedure Laterality Date   CATARACT EXTRACTION     DENTAL SURGERY     SKIN SURGERY  07/2018   SQUAMOUS CELL CARCINOMA EXCISION  09/10/2021   TONSILLECTOMY  1946   Per [place] New Patient  Packet     No Known Allergies  Outpatient Encounter Medications as of 09/29/2021  Medication Sig   acetaminophen (TYLENOL) 325 MG tablet Take 650 mg by mouth every 6 (six) hours as needed for moderate pain.   atorvastatin (LIPITOR) 80 MG tablet TAKE 1 TABLET EVERY DAY  AT  6PM   Calcium Carbonate-Vitamin D 600-400 MG-UNIT tablet Take 1 tablet by mouth 2 (two) times daily.   clopidogrel (PLAVIX) 75 MG tablet TAKE 1 TABLET EVERY DAY   feeding supplement (ENSURE ENLIVE / ENSURE PLUS) LIQD Take 237 mLs by mouth daily after lunch.   fluticasone (FLONASE) 50 MCG/ACT nasal spray Place 1 spray into both nostrils daily as needed (seasonal allergies).   levETIRAcetam (KEPPRA) 250 MG tablet Take 1 tablet (250 mg total) by mouth 2 (two) times daily.   lisinopril (ZESTRIL) 20 MG tablet TAKE 1 TABLET EVERY DAY   loperamide (IMODIUM) 2 MG capsule Take 4 mg by mouth as needed for diarrhea or loose stools.   memantine (NAMENDA) 5 MG tablet Take 1 tablet (5 mg total) by mouth 2 (two) times daily.   vitamin B-12 (CYANOCOBALAMIN) 1000 MCG tablet Take 1 tablet (1,000 mcg total) by mouth daily.   No facility-administered encounter medications on file as of 09/29/2021.    Review of Systems:  Review of Systems  Constitutional:  Negative for activity change, appetite change, chills, fever and unexpected weight change.  Gastrointestinal:  Negative for abdominal distention, abdominal pain, diarrhea, nausea and vomiting.  Genitourinary:  Positive for dysuria, frequency and urgency. Negative for difficulty urinating and flank pain.  Musculoskeletal:  Positive for back pain.  Psychiatric/Behavioral:  Positive for confusion.     Health Maintenance  Topic Date Due   TETANUS/TDAP  Never done   Zoster Vaccines- Shingrix (1 of 2) Never done   COVID-19 Vaccine (4 - Booster for Moderna series) 03/26/2020   INFLUENZA VACCINE  10/21/2021   Pneumonia Vaccine 91+ Years old  Completed   DEXA SCAN  Completed   HPV  VACCINES  Aged Out    Physical Exam: Vitals:   09/29/21 1419  BP: 100/60  Pulse: 82  Resp: 16  Temp: (!) 97.1 F (36.2 C)  SpO2: 92%  Weight: 95 lb 9.6 oz (43.4 kg)  Height: 4' 11" (1.499 m)   Body mass index is 19.31 kg/m. Physical Exam Constitutional:      Appearance: Normal appearance.  HENT:     Head: Normocephalic and atraumatic.     Nose: Nose normal.     Mouth/Throat:     Mouth: Mucous membranes are moist.  Eyes:     Conjunctiva/sclera: Conjunctivae normal.  Cardiovascular:     Rate and Rhythm: Normal rate and regular rhythm.  Pulmonary:     Effort: Pulmonary effort is normal.     Breath sounds: Normal breath sounds.  Abdominal:     General: Bowel sounds are normal.     Palpations: Abdomen is soft.  Musculoskeletal:        General: Normal range of motion.     Cervical back: Normal range of motion.  Skin:    General: Skin is warm and dry.  Neurological:     General: No focal deficit present.     Mental Status: She is alert and oriented to person, place, and time.  Psychiatric:        Mood and Affect: Mood normal.        Behavior: Behavior normal.        Thought Content: Thought content normal.        Judgment: Judgment normal.     Labs reviewed: Basic Metabolic Panel: Recent Labs    12/25/20 1351 03/16/21 2043 09/16/21 1341 09/17/21 0443 09/22/21 1338  NA 136   < > 134* 137 136  K 4.7   < > 4.3 4.3 4.8  CL 101   < > 101 105 102  CO2 25   < > _0 GLUCOSE 96   < > 135* 106* 95  BUN 21   < > 22 25* 23  CREATININE 1.17*   < > 0.91 1.00 1.02*  CALCIUM 9.0   < > 9.2 8.8* 9.5  TSH 1.87  --   --  2.287  --    < > = values in this interval not displayed.   Liver Function Tests: Recent Labs    03/16/21 2043 05/16/21 1552 09/16/21 1341 09/17/21 0443 09/22/21 1338  AST 25   < > _1 ALT 21   < > _2 ALKPHOS 143*  --  126 133*  --   BILITOT 0.8   < > 1.0 2.2*  2.5* 0.8  PROT 6.3*   < > 6.4* 6.4* 6.1  ALBUMIN 4.1  --  4.2  4.1  --    < > =  values in this interval not displayed.   No results for input(s): "LIPASE", "AMYLASE" in the last 8760 hours. No results for input(s): "AMMONIA" in the last 8760 hours. CBC: Recent Labs    03/16/21 2043 09/15/21 1544 09/16/21 1341 09/17/21 0443 09/22/21 1338  WBC 11.4* 5.4 4.7 6.3 4.3  NEUTROABS 7.2 3,532  --   --  2,950  HGB 8.8* 6.3* 6.2* 10.1* 9.9*  HCT 27.9* 20.0* 20.8* 31.1* 31.0*  MCV 109.8* 111.1* 116.2* 101.0* 101.6*  PLT 150 88* 80* 77* 61*   Lipid Panel: Recent Labs    03/10/21 1544  CHOL 94  HDL 39*  LDLCALC 35  TRIG 122  CHOLHDL 2.4   Lab Results  Component Value Date   HGBA1C 5.5 04/16/2020    Procedures since last visit: DG Abd Portable 1V  Result Date: 09/17/2021 CLINICAL DATA:  Abdominal distension EXAM: PORTABLE ABDOMEN - 1 VIEW COMPARISON:  03/10/2021 chest x-ray FINDINGS: Nonobstructive bowel gas pattern with air throughout the small bowel and into the right colon. No significant dilatation pattern or ileus. Severe degenerative changes of the visualized spine with dextroscoliosis. Degenerative changes of the hips as well. Bones are osteopenic. Aorta iliac atherosclerosis noted. Normal heart size.  Lung bases clear. IMPRESSION: Nonobstructive bowel gas pattern.  Chronic findings as above. Electronically Signed   By: Jerilynn Mages.  Shick M.D.   On: 09/17/2021 10:53    Assessment/Plan 1. Urinary frequency Confusion Back pain, unspecified back location, unspecified back pain laterality, unspecified chronicity - will get Urine sample for urinalysis and culture and sensitivity -  was not able to obtain sample while in the clinic, son-in-law was given the kit to obtain urine sample and send back to clinic to be processed  2.  Other specified Hypotension -  BP low -  will decrease Lisinopril from  20 mg daily to 10 mg daily -  monitor BP  3.  Symptomatic anemia Lab Results  Component Value Date   HGB 9.9 (L) 09/22/2021   -  was transfused 2  units PRBC in the hospital -  follow up with hematology   Labs/tests ordered:  urine dipstick with culture and sensitivity  Next appt:  12/04/2021

## 2021-09-29 NOTE — Patient Instructions (Signed)
Hypotension As your heart beats, it forces blood through your body. This force is called blood pressure. If you have hypotension, you have low blood pressure.  When your blood pressure is too low, you may not get enough blood to your brain or other parts of your body. This may cause you to feel weak, light-headed, have a fast heartbeat, or even faint. Low blood pressure may be harmless, or it may cause serious problems. What are the causes? Blood loss. Not enough water in the body (dehydration). Heart problems. Hormone problems. Pregnancy. A very bad infection. Not having enough of certain nutrients. Very bad allergic reactions. Certain medicines. What increases the risk? Age. The risk increases as you get older. Conditions that affect the heart or the brain and spinal cord (central nervous system). What are the signs or symptoms? Feeling: Weak. Light-headed. Dizzy. Tired (fatigued). Blurred vision. Fast heartbeat. Fainting, in very bad cases. How is this treated? Changing your diet. This may involve drinking more water or including more salt (sodium) in your diet by eating high-salt foods. Taking medicines to raise your blood pressure. Changing how much you take (the dosage) of some of your medicines. Wearing compression stockings. These stockings help to prevent blood clots and reduce swelling in your legs. In some cases, you may need to go to the hospital to: Receive fluids through an IV tube. Receive donated blood through an IV tube (transfusion). Get treated for an infection or heart problems, if this applies. Be monitored while medicines that you are taking wear off. Follow these instructions at home: Eating and drinking  Drink enough fluids to keep your pee (urine) pale yellow. Eat a healthy diet. Follow instructions from your doctor about what you can eat or drink. A healthy diet includes: Fresh fruits and vegetables. Whole grains. Low-fat (lean) meats. Low-fat  dairy products. If told, include more salt in your diet. Do not add extra salt to your diet unless your doctor tells you to. Eat small meals often. Avoid standing up quickly after you eat. Medicines Take over-the-counter and prescription medicines only as told by your doctor. Follow instructions from your doctor about changing how much you take of your medicines, if this applies. Do not stop or change any of your medicines on your own. General instructions  Wear compression stockings as told by your doctor. Get up slowly from lying down or sitting. Avoid hot showers and a lot of heat as told by your doctor. Return to your normal activities when your doctor says that it is safe. Do not smoke or use any products that contain nicotine or tobacco. If you need help quitting, ask your doctor. Keep all follow-up visits. Contact a doctor if: You vomit. You have watery poop (diarrhea). You have a fever for more than 2-3 days. You feel more thirsty than normal. You feel weak and tired. Get help right away if: You have chest pain. You have a fast or uneven heartbeat. You lose feeling (have numbness) in any part of your body. You cannot move your arms or your legs. You have trouble talking. You get sweaty or feel light-headed. You faint. You have trouble breathing. You have trouble staying awake. You feel mixed up (confused). These symptoms may be an emergency. Get help right away. Call 911. Do not wait to see if the symptoms will go away. Do not drive yourself to the hospital. Summary Hypotension is also called low blood pressure. It is when the force of blood pumping through your body   is too weak. Hypotension may be harmless, or it may cause serious problems. Treatment may include changing your diet and medicines, and wearing compression stockings. In very bad cases, you may need to go to the hospital. This information is not intended to replace advice given to you by your health care  provider. Make sure you discuss any questions you have with your health care provider. Document Revised: 10/28/2020 Document Reviewed: 10/28/2020 Elsevier Patient Education  2023 Elsevier Inc.  

## 2021-09-30 ENCOUNTER — Other Ambulatory Visit: Payer: Self-pay | Admitting: Nurse Practitioner

## 2021-09-30 ENCOUNTER — Other Ambulatory Visit: Payer: Self-pay

## 2021-09-30 DIAGNOSIS — R410 Disorientation, unspecified: Secondary | ICD-10-CM | POA: Diagnosis not present

## 2021-09-30 DIAGNOSIS — M549 Dorsalgia, unspecified: Secondary | ICD-10-CM | POA: Diagnosis not present

## 2021-09-30 DIAGNOSIS — R350 Frequency of micturition: Secondary | ICD-10-CM

## 2021-09-30 DIAGNOSIS — R634 Abnormal weight loss: Secondary | ICD-10-CM

## 2021-09-30 DIAGNOSIS — R2689 Other abnormalities of gait and mobility: Secondary | ICD-10-CM | POA: Diagnosis not present

## 2021-10-01 LAB — URINE CULTURE
MICRO NUMBER:: 13632106
Result:: NO GROWTH
SPECIMEN QUALITY:: ADEQUATE

## 2021-10-01 NOTE — Progress Notes (Signed)
Urine culture showed no growth, negative for UTI.

## 2021-10-02 ENCOUNTER — Telehealth: Payer: Self-pay | Admitting: *Deleted

## 2021-10-02 DIAGNOSIS — R2689 Other abnormalities of gait and mobility: Secondary | ICD-10-CM | POA: Diagnosis not present

## 2021-10-02 NOTE — Telephone Encounter (Signed)
Erline Levine with [facility] called and stated that patient's daughter has Requested Palliative Care to Follow Patient at [place], [place].   Needing a Verbal to Follow Patient for Palliative Care at Facility  Please Advise. (Forwarded to Bard Herbert, NP due to [place] out of office)

## 2021-10-02 NOTE — Telephone Encounter (Signed)
Gerlene Fee, NP  You 58 minutes ago (10:39 AM)   Please ok this change      Verbal Given to [place] with [employer]

## 2021-10-07 DIAGNOSIS — R2689 Other abnormalities of gait and mobility: Secondary | ICD-10-CM | POA: Diagnosis not present

## 2021-10-08 ENCOUNTER — Inpatient Hospital Stay: Payer: Medicare HMO

## 2021-10-08 ENCOUNTER — Telehealth: Payer: Self-pay

## 2021-10-08 ENCOUNTER — Inpatient Hospital Stay: Payer: Medicare HMO | Attending: Physician Assistant | Admitting: Physician Assistant

## 2021-10-08 ENCOUNTER — Other Ambulatory Visit: Payer: Self-pay

## 2021-10-08 VITALS — BP 128/35 | HR 98 | Temp 97.8°F | Resp 16 | Wt 94.6 lb

## 2021-10-08 DIAGNOSIS — Z79899 Other long term (current) drug therapy: Secondary | ICD-10-CM | POA: Insufficient documentation

## 2021-10-08 DIAGNOSIS — Z8673 Personal history of transient ischemic attack (TIA), and cerebral infarction without residual deficits: Secondary | ICD-10-CM | POA: Insufficient documentation

## 2021-10-08 DIAGNOSIS — D539 Nutritional anemia, unspecified: Secondary | ICD-10-CM | POA: Diagnosis not present

## 2021-10-08 DIAGNOSIS — Z85828 Personal history of other malignant neoplasm of skin: Secondary | ICD-10-CM | POA: Insufficient documentation

## 2021-10-08 DIAGNOSIS — D696 Thrombocytopenia, unspecified: Secondary | ICD-10-CM

## 2021-10-08 DIAGNOSIS — Z85820 Personal history of malignant melanoma of skin: Secondary | ICD-10-CM | POA: Insufficient documentation

## 2021-10-08 DIAGNOSIS — I10 Essential (primary) hypertension: Secondary | ICD-10-CM | POA: Diagnosis not present

## 2021-10-08 LAB — CBC WITH DIFFERENTIAL (CANCER CENTER ONLY)
Abs Immature Granulocytes: 0.47 10*3/uL — ABNORMAL HIGH (ref 0.00–0.07)
Basophils Absolute: 0 10*3/uL (ref 0.0–0.1)
Basophils Relative: 1 %
Eosinophils Absolute: 0.1 10*3/uL (ref 0.0–0.5)
Eosinophils Relative: 1 %
HCT: 22.5 % — ABNORMAL LOW (ref 36.0–46.0)
Hemoglobin: 7.4 g/dL — ABNORMAL LOW (ref 12.0–15.0)
Immature Granulocytes: 10 %
Lymphocytes Relative: 20 %
Lymphs Abs: 0.9 10*3/uL (ref 0.7–4.0)
MCH: 33.3 pg (ref 26.0–34.0)
MCHC: 32.9 g/dL (ref 30.0–36.0)
MCV: 101.4 fL — ABNORMAL HIGH (ref 80.0–100.0)
Monocytes Absolute: 0.5 10*3/uL (ref 0.1–1.0)
Monocytes Relative: 10 %
Neutro Abs: 2.7 10*3/uL (ref 1.7–7.7)
Neutrophils Relative %: 58 %
Platelet Count: 71 10*3/uL — ABNORMAL LOW (ref 150–400)
RBC: 2.22 MIL/uL — ABNORMAL LOW (ref 3.87–5.11)
RDW: 21.5 % — ABNORMAL HIGH (ref 11.5–15.5)
WBC Count: 4.7 10*3/uL (ref 4.0–10.5)
nRBC: 1.1 % — ABNORMAL HIGH (ref 0.0–0.2)

## 2021-10-08 LAB — CMP (CANCER CENTER ONLY)
ALT: 18 U/L (ref 0–44)
AST: 20 U/L (ref 15–41)
Albumin: 4.1 g/dL (ref 3.5–5.0)
Alkaline Phosphatase: 145 U/L — ABNORMAL HIGH (ref 38–126)
Anion gap: 2 — ABNORMAL LOW (ref 5–15)
BUN: 23 mg/dL (ref 8–23)
CO2: 29 mmol/L (ref 22–32)
Calcium: 9.5 mg/dL (ref 8.9–10.3)
Chloride: 101 mmol/L (ref 98–111)
Creatinine: 0.85 mg/dL (ref 0.44–1.00)
GFR, Estimated: >60 mL/min (ref >60)
Glucose, Bld: 106 mg/dL — ABNORMAL HIGH (ref 70–99)
Potassium: 4.6 mmol/L (ref 3.5–5.1)
Sodium: 132 mmol/L — ABNORMAL LOW (ref 135–145)
Total Bilirubin: 0.7 mg/dL (ref 0.3–1.2)
Total Protein: 6 g/dL — ABNORMAL LOW (ref 6.5–8.1)

## 2021-10-08 LAB — LACTATE DEHYDROGENASE: LDH: 490 U/L — ABNORMAL HIGH (ref 98–192)

## 2021-10-08 LAB — DIRECT ANTIGLOBULIN TEST (NOT AT ARMC)
DAT, IgG: NEGATIVE
DAT, complement: NEGATIVE

## 2021-10-08 LAB — RETIC PANEL
Immature Retic Fract: 36.9 % — ABNORMAL HIGH (ref 2.3–15.9)
RBC.: 2.27 MIL/uL — ABNORMAL LOW (ref 3.87–5.11)
Retic Count, Absolute: 55.4 10*3/uL (ref 19.0–186.0)
Retic Ct Pct: 2.4 % (ref 0.4–3.1)
Reticulocyte Hemoglobin: 35.6 pg (ref >27.9)

## 2021-10-08 LAB — SAMPLE TO BLOOD BANK

## 2021-10-08 LAB — PREPARE RBC (CROSSMATCH)

## 2021-10-08 NOTE — Telephone Encounter (Signed)
CRITICAL VALUE STICKER  CRITICAL VALUE:  Hgb  7.4   Platelets 71  RECEIVER (on-site recipient of call):  Bonnita Nasuti, [place] NOTIFIED: 13:18  MESSENGER (representative from lab):  Ulice Dash  MD NOTIFIED:   [place]  TIME OF NOTIFICATION:13:19  RESPONSE:

## 2021-10-08 NOTE — Progress Notes (Signed)
[place] [phone]   Fax:([phone] [place] NOTE  Patient Care Team: Lauree Chandler, NP as PCP - General (Geriatric Medicine) Ngetich, Nelda Bucks, NP as Nurse Practitioner (Family Medicine)  CHIEF COMPLAINTS/PURPOSE OF CONSULTATION:  Macrocytic anemia and thrombocytopenia  HISTORY OF PRESENTING ILLNESS:  Sarah Hampton 88 y.o. female with medical history significant for arthritis, melanoma, squamous cell carcinoma of the skin, TIA, hypertension, hyperlipidemia and seizures. She is accompanied by her daughter for this visit.   On review of the previous records, Sarah Hampton was hospitalized in June 2023 for symptomatic anemia with Hgb 6.3. Other labs showed  MCV 116.  Platelet 80 (lower than baseline).  Hemoccult negative.  Anemia panel including U88 and folic acid within normal.  PT/INR within normal.  LDH elevated to 526 raising concerning for hemolytic process.  She received two units of PRBC and hemoglobin improved to 10.1.   On exam today, Sarah Hampton reports fatigue that slightly improved with the blood transfusion. She doesn't have a good appetite since she doesn't like the food she eats at the assisted facility. She has lost weight as she has decreased oral intake. She has occasional nausea but no vomiting episodes. She has chronic bruising in her arms and legs but denies any signs of bleeding. Her bowel habits are unchanged with occasional episodes of diarrhea. She denies fevers, chills, night sweats, shortness of breath, chest pain or cough.She has no other complaints. Rest of the 10 point ROS is below.   MEDICAL HISTORY:  Past Medical History:  Diagnosis Date   Arthritis    History of melanoma    shoulder   History of squamous cell carcinoma    History of TIA (transient ischemic attack)    Hypertension    Macrocytic anemia    Mini stroke    Per [facility] New Patient Packet    Other hyperlipidemia    Personal history of malignant  neoplasm of other organs and systems    Seizure [facility])     SURGICAL HISTORY: Past Surgical History:  Procedure Laterality Date   CATARACT EXTRACTION     DENTAL SURGERY     SKIN SURGERY  07/2018   SQUAMOUS CELL CARCINOMA EXCISION  09/10/2021   TONSILLECTOMY  1946   Per [facility] New Patient Packet     SOCIAL HISTORY: Social History   Socioeconomic History   Marital status: Widowed    Spouse name: Not on file   Number of children: Not on file   Years of education: Not on file   Highest education level: Not on file  Occupational History   Occupation: retired  Tobacco Use   Smoking status: Former    Packs/day: 0.50    Years: 30.00    Total pack years: 15.00    Types: Cigarettes    Quit date: 09/28/1968    Years since quitting: 53.0   Smokeless tobacco: Never  Vaping Use   Vaping Use: Never used  Substance and Sexual Activity   Alcohol use: Not Currently   Drug use: Never   Sexual activity: Not on file  Other Topics Concern   Not on file  Social History Narrative   Per [facility] Patient Packet 01/13/2019       Diet: No answer       Caffeine: Coffee       Married, if yes what year: Widowed, married in 1964       Do you live in a house, apartment, assisted  living, condo, trailer, ect: One stories, one person (apartment)       Pets: No      Current/Past profession: Museum/gallery conservator       Exercise:Yes, 5-6 x weekly          Living Will: Yes   DNR: Yes   POA/HPOA: Yes      Functional Status:   Do you have difficulty bathing or dressing yourself?No   Do you have difficulty preparing food or eating? No   Do you have difficulty managing your medications? No   Do you have difficulty managing your finances? No   Do you have difficulty affording your medications? No   Social Determinants of Radio broadcast assistant Strain: Not on file  Food Insecurity: Not on file  Transportation Needs: Not on file  Physical Activity: Not on file  Stress: Not on file   Social Connections: Not on file  Intimate Partner Violence: Not on file    FAMILY HISTORY: Family History  Problem Relation Age of Onset   Dementia Mother 74   Breast cancer Mother    Pneumonia Father 36   Cancer Father    Thyroid disease Daughter    Thyroid disease Daughter    Stroke Neg Hx     ALLERGIES:  has No Known Allergies.  MEDICATIONS:  Current Outpatient Medications  Medication Sig Dispense Refill   acetaminophen (TYLENOL) 325 MG tablet Take 650 mg by mouth every 6 (six) hours as needed for moderate pain.     atorvastatin (LIPITOR) 80 MG tablet TAKE 1 TABLET EVERY DAY  AT  6PM 90 tablet 2   Calcium Carbonate-Vitamin D 600-400 MG-UNIT tablet Take 1 tablet by mouth 2 (two) times daily. 60 tablet 11   clopidogrel (PLAVIX) 75 MG tablet TAKE 1 TABLET EVERY DAY 90 tablet 2   feeding supplement (ENSURE ENLIVE / ENSURE PLUS) LIQD DRINK 1-CAN (237MLS) BY MOUTH AFTER LUNCH 237 mL 11   fluticasone (FLONASE) 50 MCG/ACT nasal spray Place 1 spray into both nostrils daily as needed (seasonal allergies).     levETIRAcetam (KEPPRA) 250 MG tablet Take 1 tablet (250 mg total) by mouth 2 (two) times daily. 180 tablet 3   lisinopril (ZESTRIL) 10 MG tablet Take 10 mg by mouth daily.     loperamide (IMODIUM) 2 MG capsule Take 4 mg by mouth as needed for diarrhea or loose stools.     memantine (NAMENDA) 5 MG tablet Take 1 tablet (5 mg total) by mouth 2 (two) times daily. 180 tablet 3   vitamin B-12 (CYANOCOBALAMIN) 1000 MCG tablet Take 1 tablet (1,000 mcg total) by mouth daily. 90 tablet 2   No current facility-administered medications for this visit.    REVIEW OF SYSTEMS:   Constitutional: ( - ) fevers, ( - )  chills , ( - ) night sweats Eyes: ( - ) blurriness of vision, ( - ) double vision, ( - ) watery eyes Ears, nose, mouth, throat, and face: ( - ) mucositis, ( - ) sore throat Respiratory: ( - ) cough, ( - ) dyspnea, ( - ) wheezes Cardiovascular: ( - ) palpitation, ( - ) chest  discomfort, ( - ) lower extremity swelling Gastrointestinal:  ( - ) nausea, ( - ) heartburn, ( - ) change in bowel habits Skin: ( - ) abnormal skin rashes Lymphatics: ( - ) new lymphadenopathy, ( - ) easy bruising Neurological: ( - ) numbness, ( - ) tingling, ( - ) new weaknesses  Behavioral/Psych: ( - ) mood change, ( - ) new changes  All other systems were reviewed with the patient and are negative.  PHYSICAL EXAMINATION: ECOG PERFORMANCE STATUS: 2 - Symptomatic, <50% confined to bed  Vitals:   10/08/21 1124  BP: (!) 128/35  Pulse: 98  Resp: 16  Temp: 97.8 F (36.6 C)  SpO2: 100%   Filed Weights   10/08/21 1124  Weight: 94 lb 9.6 oz (42.9 kg)    GENERAL: well appearing female in NAD  SKIN: skin color, texture, turgor are normal, no rashes or significant lesions EYES: conjunctiva are pink and non-injected, sclera clear OROPHARYNX: no exudate, no erythema; lips, buccal mucosa, and tongue normal  NECK: supple, non-tender LYMPH:  no palpable lymphadenopathy in the cervical or supraclavicular lymph nodes.  LUNGS: clear to auscultation and percussion with normal breathing effort HEART: regular rate & rhythm and no murmurs and no lower extremity edema ABDOMEN: soft, non-tender, non-distended, normal bowel sounds Musculoskeletal: no cyanosis of digits and no clubbing  PSYCH: alert & oriented x 3, fluent speech NEURO: no focal motor/sensory deficits  LABORATORY DATA:  I have reviewed the data as listed    Latest Ref Rng & Units 10/08/2021   12:38 PM 09/22/2021    1:38 PM 09/17/2021    4:43 AM  CBC  WBC 4.0 - 10.5 K/uL 4.7  4.3  6.3   Hemoglobin 12.0 - 15.0 g/dL 7.4  9.9  10.1   Hematocrit 36.0 - 46.0 % 22.5  31.0  31.1   Platelets 150 - 400 K/uL 71  61  77        Latest Ref Rng & Units 10/08/2021   12:38 PM 09/22/2021    1:38 PM 09/17/2021    4:43 AM  CMP  Glucose 70 - 99 mg/dL 106  95  106   BUN 8 - 23 mg/dL _0 Creatinine 0.44 - 1.00 mg/dL 0.85  1.02  1.00    Sodium 135 - 145 mmol/L 132  136  137   Potassium 3.5 - 5.1 mmol/L 4.6  4.8  4.3   Chloride 98 - 111 mmol/L 101  102  105   CO2 22 - 32 mmol/L _1 Calcium 8.9 - 10.3 mg/dL 9.5  9.5  8.8   Total Protein 6.5 - 8.1 g/dL 6.0  6.1  6.4   Total Bilirubin 0.3 - 1.2 mg/dL 0.7  0.8  2.5    2.2   Alkaline Phos 38 - 126 U/L 145   133   AST 15 - 41 U/L _2 ALT 0 - 44 U/L _3 RADIOGRAPHIC STUDIES: I have personally reviewed the radiological images as listed and agreed with the findings in the report. DG Abd Portable 1V  Result Date: 09/17/2021 CLINICAL DATA:  Abdominal distension EXAM: PORTABLE ABDOMEN - 1 VIEW COMPARISON:  03/10/2021 chest x-ray FINDINGS: Nonobstructive bowel gas pattern with air throughout the small bowel and into the right colon. No significant dilatation pattern or ileus. Severe degenerative changes of the visualized spine with dextroscoliosis. Degenerative changes of the hips as well. Bones are osteopenic. Aorta iliac atherosclerosis noted. Normal heart size.  Lung bases clear. IMPRESSION: Nonobstructive bowel gas pattern.  Chronic findings as above. Electronically Signed   By: Jerilynn Mages.  Shick M.D.   On: 09/17/2021 10:53    ASSESSMENT & PLAN ARIONNE IAMS is a  88 y.o. female who presents to the hematology clinic for evaluation of macrocytic anemia and thrombocytopenia.   We reviewed possible etiologies including nutritional deficiencies, hemolysis, liver disease, paraproteinemia and myeloproliferative disorders. Labs from 09/16/2021 showed no evidence of iron, vitamin B12 or folate deficiencies. Patient will proceed with serologic workup to check CBC, CMP, LDH, haptoglobin, DAT, SPEP with IFE and serum free light chains. If today's lab work is unremarkable, we will need to obtain an abdominal US to check for liver disease/splenomegaly and most likely a bone marrow biopsy to evaluate for bone marrow disorders.   #Macrocytic anemia/thrombocytopenia: --Labs  today to check CBC, CMP, LDH, haptoglobin, DAT, SPEP with IFE and serum free light chains --Check methylmalonic acid levels to rule out vitamin B12 levels  --Plan to get abdominal US and BMBx if above workup is negative --Strict precautions for bleeding --Plan to transfuse if Hgb is <8 --RTC once workup is complete  Orders Placed This Encounter  Procedures   CBC with Differential (Cancer Center Only)    Standing Status:   Future    Number of Occurrences:   1    Standing Expiration Date:   10/09/2022   CMP ([facility] only)    Standing Status:   Future    Number of Occurrences:   1    Standing Expiration Date:   10/09/2022   Methylmalonic acid, serum    Standing Status:   Future    Number of Occurrences:   1    Standing Expiration Date:   10/08/2022   Haptoglobin    Standing Status:   Future    Number of Occurrences:   1    Standing Expiration Date:   10/08/2022   Lactate dehydrogenase (LDH)    Standing Status:   Future    Number of Occurrences:   1    Standing Expiration Date:   10/08/2022   Multiple Myeloma Panel (SPEP&IFE w/QIG)    Standing Status:   Future    Number of Occurrences:   1    Standing Expiration Date:   10/08/2022   Kappa/lambda light chains    Standing Status:   Future    Number of Occurrences:   1    Standing Expiration Date:   10/08/2022   Retic Panel    Standing Status:   Future    Number of Occurrences:   1    Standing Expiration Date:   10/09/2022   Informed Consent Details: Physician/Practitioner Attestation; Transcribe to consent form and obtain patient signature    Standing Status:   Future    Number of Occurrences:   1    Standing Expiration Date:   10/09/2022    Order Specific Question:   Physician/Practitioner attestation of informed consent for blood and or blood product transfusion    Answer:   I, the physician/practitioner, attest that I have discussed with the patient the benefits, risks, side effects, alternatives, likelihood of achieving goals  and potential problems during recovery for the procedure that I have provided informed consent.    Order Specific Question:   Product(s)    Answer:   All Product(s)   Sample to Blood Bank    Standing Status:   Future    Number of Occurrences:   1    Standing Expiration Date:   10/09/2022   Direct antiglobulin test ([facility])    Standing Status:   Future    Number of Occurrences:   1    Standing Expiration Date:  10/08/2022   ABO/RH    Standing Status:   Future    Standing Expiration Date:   10/08/2022   Type and screen         Standing Status:   Future    Standing Expiration Date:   10/09/2022    All questions were answered. The patient knows to call the clinic with any problems, questions or concerns.  I have spent a total of 60 minutes minutes of face-to-face and non-face-to-face time, preparing to see the patient, obtaining and/or reviewing separately obtained history, performing a medically appropriate examination, counseling and educating the patient, ordering tests/procedures, documenting clinical information in the electronic health record, and care coordination.   Dede Query, PA-C Department of Hematology/Oncology [place] at [facility] Phone: [phone]  Patient was seen with Dr. Lorenso Courier  I have read the above note and personally examined the patient. I agree with the assessment and plan as noted above.  Briefly Ms. Jaskowiak is an 88 year old female who presents for evaluation of macrocytic anemia and thrombocytopenia.  The patient last had blood counts drawn on 09/22/2021 which showed a hemoglobin 9.9 with an MCV of 101.6 and platelets of 61.  Findings are concerning for either a nutritional efficiency, liver disease, or bone marrow dysfunction.  We will conduct a full work-up to include nutritional etiologies and hemolytic anemia.  Would also recommend ultrasound of the liver and abdomen to assess for liver disease.  In the event no clear etiology can  be found we will need to pursue bone marrow biopsy.  Patient voiced understanding of the plan moving forward.   Ledell Peoples, MD Department of Hematology/Oncology [place] at [facility] Phone: [phone] Pager: [phone] Email: [email or web address]

## 2021-10-09 ENCOUNTER — Inpatient Hospital Stay: Payer: Medicare HMO

## 2021-10-09 DIAGNOSIS — Z85820 Personal history of malignant melanoma of skin: Secondary | ICD-10-CM | POA: Diagnosis not present

## 2021-10-09 DIAGNOSIS — D539 Nutritional anemia, unspecified: Secondary | ICD-10-CM | POA: Diagnosis not present

## 2021-10-09 DIAGNOSIS — R2689 Other abnormalities of gait and mobility: Secondary | ICD-10-CM | POA: Diagnosis not present

## 2021-10-09 DIAGNOSIS — Z8673 Personal history of transient ischemic attack (TIA), and cerebral infarction without residual deficits: Secondary | ICD-10-CM | POA: Diagnosis not present

## 2021-10-09 DIAGNOSIS — Z79899 Other long term (current) drug therapy: Secondary | ICD-10-CM | POA: Diagnosis not present

## 2021-10-09 DIAGNOSIS — I10 Essential (primary) hypertension: Secondary | ICD-10-CM | POA: Diagnosis not present

## 2021-10-09 DIAGNOSIS — Z85828 Personal history of other malignant neoplasm of skin: Secondary | ICD-10-CM | POA: Diagnosis not present

## 2021-10-09 DIAGNOSIS — D696 Thrombocytopenia, unspecified: Secondary | ICD-10-CM | POA: Diagnosis not present

## 2021-10-09 LAB — HAPTOGLOBIN: Haptoglobin: 154 mg/dL (ref 41–333)

## 2021-10-09 LAB — KAPPA/LAMBDA LIGHT CHAINS
Kappa free light chain: 36.6 mg/L — ABNORMAL HIGH (ref 3.3–19.4)
Kappa, lambda light chain ratio: 1.9 — ABNORMAL HIGH (ref 0.26–1.65)
Lambda free light chains: 19.3 mg/L (ref 5.7–26.3)

## 2021-10-09 MED ORDER — DIPHENHYDRAMINE HCL 25 MG PO CAPS
25.0000 mg | ORAL_CAPSULE | Freq: Once | ORAL | Status: AC
  Administered 2021-10-09: 25 mg via ORAL
  Filled 2021-10-09: qty 1

## 2021-10-09 MED ORDER — SODIUM CHLORIDE 0.9% IV SOLUTION
250.0000 mL | Freq: Once | INTRAVENOUS | Status: AC
  Administered 2021-10-09: 250 mL via INTRAVENOUS

## 2021-10-09 MED ORDER — ACETAMINOPHEN 325 MG PO TABS
650.0000 mg | ORAL_TABLET | Freq: Once | ORAL | Status: AC
  Administered 2021-10-09: 650 mg via ORAL
  Filled 2021-10-09: qty 2

## 2021-10-09 NOTE — Patient Instructions (Signed)
Blood Transfusion, Adult A blood transfusion is a procedure in which you receive blood or a type of blood cell (blood component) through an IV. You may need a blood transfusion when your blood level is low. This may result from a bleeding disorder, illness, injury, or surgery. The blood may come from a donor. You may also be able to donate blood for yourself (autologous blood donation) before a planned surgery. The blood given in a transfusion is made up of different blood components. You may receive: Red blood cells. These carry oxygen to the cells in the body. Platelets. These help your blood to clot. Plasma. This is the liquid part of your blood. It carries proteins and other substances throughout the body. White blood cells. These help you fight infections. If you have hemophilia or another clotting disorder, you may also receive other types of blood products. Tell a health care provider about: Any blood disorders you have. Any previous reactions you have had during a blood transfusion. Any allergies you have. All medicines you are taking, including vitamins, herbs, eye drops, creams, and over-the-counter medicines. Any surgeries you have had. Any medical conditions you have, including any recent fever or cold symptoms. Whether you are pregnant or may be pregnant. What are the risks? Generally, this is a safe procedure. However, problems may occur. The most common problems include: A mild allergic reaction, such as red, swollen areas of skin (hives) and itching. Fever or chills. This may be the body's response to new blood cells received. This may occur during or up to 4 hours after the transfusion. More serious problems may include: Transfusion-associated circulatory overload (TACO), or too much fluid in the lungs. This may cause breathing problems. A serious allergic reaction, such as difficulty breathing or swelling around the face and lips. Transfusion-related acute lung injury  (TRALI), which causes breathing difficulty and low oxygen in the blood. This can occur within hours of the transfusion or several days later. Iron overload. This can happen after receiving many blood transfusions over a period of time. Infection or virus being transmitted. This is rare because donated blood is carefully tested before it is given. Hemolytic transfusion reaction. This is rare. It happens when your body's defense system (immune system)tries to attack the new blood cells. Symptoms may include fever, chills, nausea, low blood pressure, and low back or chest pain. Transfusion-associated graft-versus-host disease (TAGVHD). This is rare. It happens when donated cells attack your body's healthy tissues. What happens before the procedure? Medicines Ask your health care provider about: Changing or stopping your regular medicines. This is especially important if you are taking diabetes medicines or blood thinners. Taking medicines such as aspirin and ibuprofen. These medicines can thin your blood. Do not take these medicines unless your health care provider tells you to take them. Taking over-the-counter medicines, vitamins, herbs, and supplements. General instructions Follow instructions from your health care provider about eating and drinking restrictions. You will have a blood test to determine your blood type. This is necessary to know what kind of blood your body will accept and to match it to the donor blood. If you are going to have a planned surgery, you may be able to do an autologous blood donation. This may be done in case you need to have a transfusion. You will have your temperature, blood pressure, and pulse monitored before the transfusion. If you have had an allergic reaction to a transfusion in the past, you may be given medicine to help prevent   a reaction. This medicine may be given to you by mouth (orally) or through an IV. Set aside time for the blood transfusion. This  procedure generally takes 1-4 hours to complete. What happens during the procedure?  An IV will be inserted into one of your veins. The bag of donated blood will be attached to your IV. The blood will then enter through your vein. Your temperature, blood pressure, and pulse will be monitored regularly during the transfusion. This monitoring is done to detect early signs of a transfusion reaction. Tell your nurse right away if you have any of these symptoms during the transfusion: Shortness of breath or trouble breathing. Chest or back pain. Fever or chills. Hives or itching. If you have any signs or symptoms of a reaction, your transfusion will be stopped and you may be given medicine. When the transfusion is complete, your IV will be removed. Pressure may be applied to the IV site for a few minutes. A bandage (dressing)will be applied. The procedure may vary among health care providers and hospitals. What happens after the procedure? Your temperature, blood pressure, pulse, breathing rate, and blood oxygen level will be monitored until you leave the hospital or clinic. Your blood may be tested to see how you are responding to the transfusion. You may be warmed with fluids or blankets to maintain a normal body temperature. If you receive your blood transfusion in an outpatient setting, you will be told whom to contact to report any reactions. Where to find more information For more information on blood transfusions, visit the [employer]: redcross.org Summary A blood transfusion is a procedure in which you receive blood or a type of blood cell (blood component) through an IV. The blood you receive may come from a donor or be donated by yourself (autologous blood donation) before a planned surgery. The blood given in a transfusion is made up of different blood components. You may receive red blood cells, platelets, plasma, or white blood cells depending on the condition treated. Your  temperature, blood pressure, and pulse will be monitored before, during, and after the transfusion. After the transfusion, your blood may be tested to see how your body has responded. This information is not intended to replace advice given to you by your health care provider. Make sure you discuss any questions you have with your health care provider. Document Revised: 01/12/2019 Document Reviewed: 09/01/2018 Elsevier Patient Education  2023 [employer]  

## 2021-10-10 ENCOUNTER — Non-Acute Institutional Stay: Payer: Medicare HMO | Admitting: Hospice

## 2021-10-10 DIAGNOSIS — I10 Essential (primary) hypertension: Secondary | ICD-10-CM | POA: Diagnosis not present

## 2021-10-10 DIAGNOSIS — G40909 Epilepsy, unspecified, not intractable, without status epilepticus: Secondary | ICD-10-CM

## 2021-10-10 DIAGNOSIS — Z515 Encounter for palliative care: Secondary | ICD-10-CM | POA: Diagnosis not present

## 2021-10-10 DIAGNOSIS — D539 Nutritional anemia, unspecified: Secondary | ICD-10-CM

## 2021-10-10 DIAGNOSIS — F0390 Unspecified dementia without behavioral disturbance: Secondary | ICD-10-CM

## 2021-10-10 DIAGNOSIS — M1990 Unspecified osteoarthritis, unspecified site: Secondary | ICD-10-CM

## 2021-10-10 DIAGNOSIS — R413 Other amnesia: Secondary | ICD-10-CM

## 2021-10-10 LAB — BPAM RBC
Blood Product Expiration Date: 202307262359
ISSUE DATE / TIME: 202307201445
Unit Type and Rh: 600

## 2021-10-10 LAB — TYPE AND SCREEN
ABO/RH(D): A NEG
Antibody Screen: NEGATIVE
Unit division: 0

## 2021-10-10 NOTE — Progress Notes (Signed)
[facility] Palliative Care Consult Note Telephone: [phone]  Fax: [phone]  PATIENT NAME: Sarah Hampton [address] [place] [address] [phone] (home)  DOB: September 22, 1930 MRN: [id number]  PRIMARY CARE PROVIDER:    Lauree Chandler, NP,  [place] [address] [phone]  REFERRING PROVIDER:   Lauree Chandler, NP [place],  [place] [address] [phone]  RESPONSIBLE PARTY:   Self/D Wandra Mannan Information     Name Relation Home Work Mobile   Watt,D. Chinita Pester) Daughter [phone]  [phone]        I met face to face with patient at [facility]. Visit to build trust and highlight Palliative Medicine as specialized medical care for people living with serious illness, aimed at facilitating better quality of life through symptoms relief, assisting with advance care planning and complex medical decision making.  NP called Debbie and left her a voicemail with callback number. ASSESSMENT AND / RECOMMENDATIONS:   Advance Care Planning: Our advance care planning conversation included a discussion about:    The value and importance of advance care planning  Difference between Hospice and Palliative care Exploration of goals of care in the event of a sudden injury or illness  Identification and preparation of a healthcare agent  Review and updating or creation of an  advance directive document . Decision not to resuscitate or to de-escalate disease focused treatments due to poor prognosis.  CODE STATUS: Patient affirmed she is a Do Not Resuscitate  Goals of Care: Goals include to maximize quality of life and symptom management  I spent 16  minutes providing this initial consultation. More than 50% of the time in this consultation was spent on counseling patient and coordinating  communication. --------------------------------------------------------------------------------------------------------------------------------------  Symptom Management/Plan: Macrocytic anemia: Follow up with Oncology as planned. Patient received Blood transfusion yesterday. Routine CBC CMP Hypertension: Managed with Lisinopril Seizure disorder: Continue Keppra. Seizure precautions.  Memory loss: Managed with Aricept. Provide cues as needed, encourage reminiscence, socialization.  Continue ongoing supportive care.  Fall and safety precautions. Follow up: Palliative care will continue to follow for complex medical decision making, advance care planning, and clarification of goals. Return 6 weeks or prn. Encouraged to call provider sooner with any concerns.   Family /Caregiver/Community Supports: Patient in [place] for ongoing care  HOSPICE ELIGIBILITY/DIAGNOSIS: TBD  Chief Complaint: Initial Palliative care visit  HISTORY OF PRESENT ILLNESS:  Sarah Hampton is a 88 y.o. year old female  with multiple morbidities requiring close monitoring and with high risk of complications and  mortality:  Macrocytic anemia, Hypertension, Arthritis, seizure disorder. Patient in no acute distress, denies pain/discomfort. History obtained from review of EMR, discussion with primary team, caregiver, family and/or [name].  Review and summarization of Epic records shows history from other than patient. Rest of 10 point ROS asked and negative. Independent interpretation of tests and reviewed as needed, available labs, patient records, imaging, studies and related documents from the EMR.  Recent Labs  Lab 10/08/21 1238  WBC 4.7  HGB 7.4*  HCT 22.5*  PLT 71*  MCV 101.4*   Recent Labs  Lab 10/08/21 1238  NA 132*  K 4.6  CL 101  CO2 29  BUN 23  CREATININE 0.85  GLUCOSE 106*   Latest GFR by Cockcroft Gault (not valid in AKI or ESRD) Estimated Creatinine Clearance: 30.4 mL/min (by C-G formula based on  SCr of 0.85 mg/dL). Recent Labs  Lab 10/08/21 1238  AST 20  ALT 18  ALKPHOS 145*  x   PAST MEDICAL HISTORY:  Active Ambulatory Problems    Diagnosis Date Noted   TIA (transient ischemic attack) 09/28/2017   Essential hypertension 09/28/2017   Chronic kidney disease, stage 3a ([place]) 09/28/2017   Macrocytic anemia 04/15/2020   Leukocytosis 04/15/2020   History of TIA (transient ischemic attack) 04/15/2020   Arthritis 08/24/2017   Other hyperlipidemia 11/12/2017   Personal history of malignant neoplasm of other organs and systems 10/16/2016   Squamous cell carcinoma 08/24/2017   Widowed 08/24/2017   Thrombocytopenia ([place]) 09/16/2021   Symptomatic anemia 09/16/2021   History of seizure 09/17/2021   Hyperbilirubinemia 09/17/2021   Hyponatremia 09/17/2021   Resolved Ambulatory Problems    Diagnosis Date Noted   No Resolved Ambulatory Problems   Past Medical History:  Diagnosis Date   History of melanoma    History of squamous cell carcinoma    Hypertension    Mini stroke    Seizure ([place])     SOCIAL HX:  Social History   Tobacco Use   Smoking status: Former    Packs/day: 0.50    Years: 30.00    Total pack years: 15.00    Types: Cigarettes    Quit date: 09/28/1968    Years since quitting: 53.0   Smokeless tobacco: Never  Substance Use Topics   Alcohol use: Not Currently     FAMILY HX:  Family History  Problem Relation Age of Onset   Dementia Mother 23   Breast cancer Mother    Pneumonia Father 38   Cancer Father    Thyroid disease Daughter    Thyroid disease Daughter    Stroke Neg Hx       ALLERGIES: No Known Allergies    PERTINENT MEDICATIONS:  Outpatient Encounter Medications as of 10/10/2021  Medication Sig   acetaminophen (TYLENOL) 325 MG tablet Take 650 mg by mouth every 6 (six) hours as needed for moderate pain.   atorvastatin (LIPITOR) 80 MG tablet TAKE 1 TABLET EVERY DAY  AT  6PM   Calcium Carbonate-Vitamin D 600-400 MG-UNIT tablet Take 1  tablet by mouth 2 (two) times daily.   clopidogrel (PLAVIX) 75 MG tablet TAKE 1 TABLET EVERY DAY   feeding supplement (ENSURE ENLIVE / ENSURE PLUS) LIQD DRINK 1-CAN (237MLS) BY MOUTH AFTER LUNCH   fluticasone (FLONASE) 50 MCG/ACT nasal spray Place 1 spray into both nostrils daily as needed (seasonal allergies).   levETIRAcetam (KEPPRA) 250 MG tablet Take 1 tablet (250 mg total) by mouth 2 (two) times daily.   lisinopril (ZESTRIL) 10 MG tablet Take 10 mg by mouth daily.   loperamide (IMODIUM) 2 MG capsule Take 4 mg by mouth as needed for diarrhea or loose stools.   memantine (NAMENDA) 5 MG tablet Take 1 tablet (5 mg total) by mouth 2 (two) times daily.   vitamin B-12 (CYANOCOBALAMIN) 1000 MCG tablet Take 1 tablet (1,000 mcg total) by mouth daily.   No facility-administered encounter medications on file as of 10/10/2021.     Thank you for the opportunity to participate in the care of Ms. Scheier.  The palliative care team will continue to follow. Please call our office at [phone] if we can be of additional assistance.   Note: Portions of this note were generated with Lobbyist. Dictation errors may occur despite best attempts at proofreading.  Teodoro Spray, NP

## 2021-10-12 LAB — METHYLMALONIC ACID, SERUM: Methylmalonic Acid, Quantitative: 180 nmol/L (ref 0–378)

## 2021-10-13 ENCOUNTER — Other Ambulatory Visit: Payer: Self-pay | Admitting: Physician Assistant

## 2021-10-13 ENCOUNTER — Telehealth: Payer: Self-pay | Admitting: Physician Assistant

## 2021-10-13 DIAGNOSIS — D696 Thrombocytopenia, unspecified: Secondary | ICD-10-CM

## 2021-10-13 DIAGNOSIS — D539 Nutritional anemia, unspecified: Secondary | ICD-10-CM

## 2021-10-13 MED ORDER — LORAZEPAM 0.5 MG PO TABS: 0.5000 mg | ORAL_TABLET | Freq: Once | ORAL | 0 refills | Status: AC

## 2021-10-13 NOTE — Telephone Encounter (Signed)
I spoke to Ms. Sarah Hampton and her daughter. We reviewed the labs from 10/08/2021.  Findings show macrocytic anemia and thrombocytopenia.  Patient feels stable after receiving 1 unit of PRBC last week she is returning from her trip to the beach.  We reviewed the remaining results that showed no evidence of vitamin B12 deficiency or hemolysis.  SPEP with IFE is pending but we do recommend further work-up.  This includes an abdominal ultrasound to evaluate for liver disease and a bone marrow biopsy to rule out bone marrow disorders.   We will have the patient return tomorrow to repeat her globin levels and determine if additional blood transfusion is needed.  I will send a dose of PO Ativan 0.5 mg to take prior to her bone marrow biopsy.  Patient and her family expressed understanding and satisfaction with the plan provided

## 2021-10-14 ENCOUNTER — Telehealth: Payer: Self-pay | Admitting: *Deleted

## 2021-10-14 ENCOUNTER — Other Ambulatory Visit: Payer: Self-pay

## 2021-10-14 ENCOUNTER — Telehealth: Payer: Self-pay

## 2021-10-14 ENCOUNTER — Inpatient Hospital Stay: Payer: Medicare HMO

## 2021-10-14 ENCOUNTER — Telehealth: Payer: Self-pay | Admitting: Hospice

## 2021-10-14 ENCOUNTER — Other Ambulatory Visit: Payer: Self-pay | Admitting: Physician Assistant

## 2021-10-14 DIAGNOSIS — D696 Thrombocytopenia, unspecified: Secondary | ICD-10-CM

## 2021-10-14 DIAGNOSIS — D539 Nutritional anemia, unspecified: Secondary | ICD-10-CM | POA: Diagnosis not present

## 2021-10-14 DIAGNOSIS — Z85828 Personal history of other malignant neoplasm of skin: Secondary | ICD-10-CM | POA: Diagnosis not present

## 2021-10-14 DIAGNOSIS — Z79899 Other long term (current) drug therapy: Secondary | ICD-10-CM | POA: Diagnosis not present

## 2021-10-14 DIAGNOSIS — I10 Essential (primary) hypertension: Secondary | ICD-10-CM | POA: Diagnosis not present

## 2021-10-14 DIAGNOSIS — Z8673 Personal history of transient ischemic attack (TIA), and cerebral infarction without residual deficits: Secondary | ICD-10-CM | POA: Diagnosis not present

## 2021-10-14 DIAGNOSIS — Z85820 Personal history of malignant melanoma of skin: Secondary | ICD-10-CM | POA: Diagnosis not present

## 2021-10-14 DIAGNOSIS — Z515 Encounter for palliative care: Secondary | ICD-10-CM

## 2021-10-14 LAB — CBC WITH DIFFERENTIAL (CANCER CENTER ONLY)
Abs Immature Granulocytes: 0.62 10*3/uL — ABNORMAL HIGH (ref 0.00–0.07)
Basophils Absolute: 0.1 10*3/uL (ref 0.0–0.1)
Basophils Relative: 1 %
Eosinophils Absolute: 0.1 10*3/uL (ref 0.0–0.5)
Eosinophils Relative: 2 %
HCT: 29.8 % — ABNORMAL LOW (ref 36.0–46.0)
Hemoglobin: 9.4 g/dL — ABNORMAL LOW (ref 12.0–15.0)
Immature Granulocytes: 12 %
Lymphocytes Relative: 18 %
Lymphs Abs: 0.9 10*3/uL (ref 0.7–4.0)
MCH: 31.5 pg (ref 26.0–34.0)
MCHC: 31.5 g/dL (ref 30.0–36.0)
MCV: 100 fL (ref 80.0–100.0)
Monocytes Absolute: 0.5 10*3/uL (ref 0.1–1.0)
Monocytes Relative: 9 %
Neutro Abs: 2.8 10*3/uL (ref 1.7–7.7)
Neutrophils Relative %: 58 %
Platelet Count: 53 10*3/uL — ABNORMAL LOW (ref 150–400)
RBC: 2.98 MIL/uL — ABNORMAL LOW (ref 3.87–5.11)
RDW: 19.9 % — ABNORMAL HIGH (ref 11.5–15.5)
WBC Count: 5 10*3/uL (ref 4.0–10.5)
nRBC: 1.2 % — ABNORMAL HIGH (ref 0.0–0.2)

## 2021-10-14 LAB — MULTIPLE MYELOMA PANEL, SERUM
Albumin SerPl Elph-Mcnc: 3.5 g/dL (ref 2.9–4.4)
Albumin/Glob SerPl: 1.7 (ref 0.7–1.7)
Alpha 1: 0.3 g/dL (ref 0.0–0.4)
Alpha2 Glob SerPl Elph-Mcnc: 0.6 g/dL (ref 0.4–1.0)
B-Globulin SerPl Elph-Mcnc: 0.6 g/dL — ABNORMAL LOW (ref 0.7–1.3)
Gamma Glob SerPl Elph-Mcnc: 0.6 g/dL (ref 0.4–1.8)
Globulin, Total: 2.1 g/dL — ABNORMAL LOW (ref 2.2–3.9)
IgA: 56 mg/dL — ABNORMAL LOW (ref 64–422)
IgG (Immunoglobin G), Serum: 596 mg/dL (ref 586–1602)
IgM (Immunoglobulin M), Srm: 61 mg/dL (ref 26–217)
Total Protein ELP: 5.6 g/dL — ABNORMAL LOW (ref 6.0–8.5)

## 2021-10-14 LAB — SAMPLE TO BLOOD BANK

## 2021-10-14 NOTE — Progress Notes (Signed)
Per Dr. Lorenso Courier, no PRBC today with HGB of 9.4.  Pt and family informed.  VSS, discharge to lobby in stable condition in wheelchair

## 2021-10-14 NOTE — Telephone Encounter (Signed)
Spoke with pt's daughter, [name], to advise that her mom's HGB was 9.4. No need for blood transfusion today.

## 2021-10-14 NOTE — Telephone Encounter (Signed)
DJ called to affirm that patient is a DNR, with memory loss and scheduled for series of tests/diagnostics including bone biopsy. Therapeutic listening and ample emotional support provided.

## 2021-10-15 ENCOUNTER — Ambulatory Visit (HOSPITAL_COMMUNITY)
Admission: RE | Admit: 2021-10-15 | Discharge: 2021-10-15 | Disposition: A | Discharge status: Home / Self Care | Payer: Medicare HMO | Source: Ambulatory Visit | Attending: Physician Assistant | Admitting: Physician Assistant

## 2021-10-15 DIAGNOSIS — D696 Thrombocytopenia, unspecified: Secondary | ICD-10-CM | POA: Insufficient documentation

## 2021-10-15 DIAGNOSIS — R161 Splenomegaly, not elsewhere classified: Secondary | ICD-10-CM | POA: Diagnosis not present

## 2021-10-15 DIAGNOSIS — D539 Nutritional anemia, unspecified: Secondary | ICD-10-CM | POA: Insufficient documentation

## 2021-10-16 DIAGNOSIS — R2689 Other abnormalities of gait and mobility: Secondary | ICD-10-CM | POA: Diagnosis not present

## 2021-10-17 ENCOUNTER — Other Ambulatory Visit: Payer: Self-pay | Admitting: Physician Assistant

## 2021-10-17 ENCOUNTER — Telehealth: Payer: Self-pay | Admitting: Physician Assistant

## 2021-10-17 ENCOUNTER — Telehealth: Payer: Self-pay

## 2021-10-17 DIAGNOSIS — D696 Thrombocytopenia, unspecified: Secondary | ICD-10-CM

## 2021-10-17 DIAGNOSIS — D539 Nutritional anemia, unspecified: Secondary | ICD-10-CM

## 2021-10-17 NOTE — Telephone Encounter (Signed)
Pt daughter has been called and confirmed appt for 8/4

## 2021-10-17 NOTE — Telephone Encounter (Signed)
-----  Message from Lincoln Brigham, PA-C sent at 10/16/2021  4:42 PM EDT ----- Please notify that US shows no liver disease. Still recommend bone marrow biopsy which is already scheduled. We will follow up with patient once biopsy results are available.

## 2021-10-17 NOTE — Telephone Encounter (Signed)
Pt's daughter, [name], advised with VU.  She confirmed appt for BMBX.

## 2021-10-20 ENCOUNTER — Emergency Department (HOSPITAL_COMMUNITY): Payer: Medicare HMO

## 2021-10-20 ENCOUNTER — Other Ambulatory Visit: Payer: Self-pay

## 2021-10-20 ENCOUNTER — Emergency Department (HOSPITAL_COMMUNITY)
Admission: EM | Admit: 2021-10-20 | Discharge: 2021-10-20 | Disposition: A | Discharge status: Home / Self Care | Payer: Medicare HMO | Attending: Emergency Medicine | Admitting: Emergency Medicine

## 2021-10-20 DIAGNOSIS — R609 Edema, unspecified: Secondary | ICD-10-CM | POA: Diagnosis not present

## 2021-10-20 DIAGNOSIS — Y92002 Bathroom of unspecified non-institutional (private) residence single-family (private) house as the place of occurrence of the external cause: Secondary | ICD-10-CM | POA: Diagnosis not present

## 2021-10-20 DIAGNOSIS — Z7902 Long term (current) use of antithrombotics/antiplatelets: Secondary | ICD-10-CM | POA: Insufficient documentation

## 2021-10-20 DIAGNOSIS — S0990XA Unspecified injury of head, initial encounter: Secondary | ICD-10-CM

## 2021-10-20 DIAGNOSIS — N183 Chronic kidney disease, stage 3 unspecified: Secondary | ICD-10-CM | POA: Insufficient documentation

## 2021-10-20 DIAGNOSIS — D649 Anemia, unspecified: Secondary | ICD-10-CM | POA: Diagnosis not present

## 2021-10-20 DIAGNOSIS — W01198A Fall on same level from slipping, tripping and stumbling with subsequent striking against other object, initial encounter: Secondary | ICD-10-CM | POA: Diagnosis not present

## 2021-10-20 DIAGNOSIS — I129 Hypertensive chronic kidney disease with stage 1 through stage 4 chronic kidney disease, or unspecified chronic kidney disease: Secondary | ICD-10-CM | POA: Diagnosis not present

## 2021-10-20 DIAGNOSIS — I959 Hypotension, unspecified: Secondary | ICD-10-CM | POA: Diagnosis not present

## 2021-10-20 DIAGNOSIS — I700 Atherosclerosis of aorta: Secondary | ICD-10-CM | POA: Diagnosis not present

## 2021-10-20 DIAGNOSIS — Z79899 Other long term (current) drug therapy: Secondary | ICD-10-CM | POA: Diagnosis not present

## 2021-10-20 DIAGNOSIS — S0003XA Contusion of scalp, initial encounter: Secondary | ICD-10-CM | POA: Diagnosis not present

## 2021-10-20 DIAGNOSIS — F0150 Vascular dementia without behavioral disturbance: Secondary | ICD-10-CM | POA: Insufficient documentation

## 2021-10-20 DIAGNOSIS — M419 Scoliosis, unspecified: Secondary | ICD-10-CM | POA: Diagnosis not present

## 2021-10-20 DIAGNOSIS — S299XXA Unspecified injury of thorax, initial encounter: Secondary | ICD-10-CM | POA: Diagnosis not present

## 2021-10-20 DIAGNOSIS — W19XXXA Unspecified fall, initial encounter: Secondary | ICD-10-CM | POA: Diagnosis not present

## 2021-10-20 DIAGNOSIS — R9431 Abnormal electrocardiogram [ECG] [EKG]: Secondary | ICD-10-CM | POA: Diagnosis not present

## 2021-10-20 DIAGNOSIS — Z043 Encounter for examination and observation following other accident: Secondary | ICD-10-CM | POA: Diagnosis not present

## 2021-10-20 LAB — BASIC METABOLIC PANEL
Anion gap: 8 (ref 5–15)
BUN: 21 mg/dL (ref 8–23)
CO2: 25 mmol/L (ref 22–32)
Calcium: 9.3 mg/dL (ref 8.9–10.3)
Chloride: 99 mmol/L (ref 98–111)
Creatinine, Ser: 0.98 mg/dL (ref 0.44–1.00)
GFR, Estimated: 55 mL/min — ABNORMAL LOW (ref >60)
Glucose, Bld: 107 mg/dL — ABNORMAL HIGH (ref 70–99)
Potassium: 4.6 mmol/L (ref 3.5–5.1)
Sodium: 132 mmol/L — ABNORMAL LOW (ref 135–145)

## 2021-10-20 LAB — CBC WITH DIFFERENTIAL/PLATELET
Abs Immature Granulocytes: 0 10*3/uL (ref 0.00–0.07)
Basophils Absolute: 0.1 10*3/uL (ref 0.0–0.1)
Basophils Relative: 1 %
Eosinophils Absolute: 0.1 10*3/uL (ref 0.0–0.5)
Eosinophils Relative: 1 %
HCT: 28.3 % — ABNORMAL LOW (ref 36.0–46.0)
Hemoglobin: 9.3 g/dL — ABNORMAL LOW (ref 12.0–15.0)
Lymphocytes Relative: 22 %
Lymphs Abs: 1.5 10*3/uL (ref 0.7–4.0)
MCH: 32.7 pg (ref 26.0–34.0)
MCHC: 32.9 g/dL (ref 30.0–36.0)
MCV: 99.6 fL (ref 80.0–100.0)
Monocytes Absolute: 0.4 10*3/uL (ref 0.1–1.0)
Monocytes Relative: 5 %
Neutro Abs: 5 10*3/uL (ref 1.7–7.7)
Neutrophils Relative %: 71 %
Platelets: 75 10*3/uL — ABNORMAL LOW (ref 150–400)
RBC: 2.84 MIL/uL — ABNORMAL LOW (ref 3.87–5.11)
RDW: 20.5 % — ABNORMAL HIGH (ref 11.5–15.5)
WBC: 7 10*3/uL (ref 4.0–10.5)
nRBC: 2 % — ABNORMAL HIGH (ref 0.0–0.2)
nRBC: 2 /100 WBC — ABNORMAL HIGH

## 2021-10-20 NOTE — ED Provider Notes (Signed)
[facility] EMERGENCY DEPARTMENT Provider Note   CSN: [id number] Arrival date & time: 10/20/21  1208     History  No chief complaint on file.   Sarah Hampton is a 88 y.o. female with a history of hypertension, CKD stage III, anemia, vascular dementia without behavioral disturbance, history of TIA (on Plavix).  Presents to the emergency department with a chief complaint of fall.  Patient states that she was attempting to go to the bathroom when she slipped and fell.  Patient endorses hitting her head but denies any loss conscious.  Patient is unsure how long she was on the floor before staff members arrived.  Patient does complain of pain to her neck.    I spoke to staff member from [facility], Jamelle Haring, who reported that patient suffered an unwitnessed fall just prior to arrival in the emergency department.  An aide had just finished stressing her and stepped out of the room.  Another staff member went into the room within 5 minutes and found the patient lying on the floor on the bathroom.  Patient told staff members there that she slipped and fell hitting her head.  Patient is alert to person, place, and time at baseline.  Patient has a history of dementia but is not on any lockdown unit.  I spoke to daughter who also cooperated that patient is alert to person, place, and time at baseline for the most part.  Patient denies any numbness, weakness, facial asymmetry, dysarthria, visual disturbance, headache, abdominal pain, nausea, vomiting, chest pain, shortness of breath, myalgia or arthralgia.  HPI     Home Medications Prior to Admission medications   Medication Sig Start Date End Date Taking? Authorizing Provider  acetaminophen (TYLENOL) 325 MG tablet Take 650 mg by mouth every 6 (six) hours as needed for moderate pain.    [provider]  atorvastatin (LIPITOR) 80 MG tablet TAKE 1 TABLET EVERY DAY  AT  6PM 05/08/21   Lauree Chandler, NP  Calcium  Carbonate-Vitamin D 600-400 MG-UNIT tablet Take 1 tablet by mouth 2 (two) times daily. 09/20/20   Lauree Chandler, NP  clopidogrel (PLAVIX) 75 MG tablet TAKE 1 TABLET EVERY DAY 05/08/21   Lauree Chandler, NP  feeding supplement (ENSURE ENLIVE / ENSURE PLUS) LIQD DRINK 1-CAN (237MLS) BY MOUTH AFTER LUNCH 09/30/21   Lauree Chandler, NP  fluticasone (FLONASE) 50 MCG/ACT nasal spray Place 1 spray into both nostrils daily as needed (seasonal allergies).    [provider]  levETIRAcetam (KEPPRA) 250 MG tablet Take 1 tablet (250 mg total) by mouth 2 (two) times daily. 03/25/21 03/23/48  Alric Ran, MD  lisinopril (ZESTRIL) 10 MG tablet Take 10 mg by mouth daily. 09/29/21   [provider]  loperamide (IMODIUM) 2 MG capsule Take 4 mg by mouth as needed for diarrhea or loose stools.    [provider]  memantine (NAMENDA) 5 MG tablet Take 1 tablet (5 mg total) by mouth 2 (two) times daily. 03/25/21 03/23/48  Alric Ran, MD  vitamin B-12 (CYANOCOBALAMIN) 1000 MCG tablet Take 1 tablet (1,000 mcg total) by mouth daily. 12/26/20   Lauree Chandler, NP      Allergies    Patient has no known allergies.    Review of Systems   Review of Systems  Constitutional:  Negative for chills and fever.  Eyes:  Negative for visual disturbance.  Respiratory:  Negative for shortness of breath.   Cardiovascular:  Negative for  chest pain.  Gastrointestinal:  Negative for abdominal pain, nausea and vomiting.  Musculoskeletal:  Positive for neck pain. Negative for back pain.  Skin:  Negative for color change, pallor, rash and wound.  Neurological:  Negative for dizziness, tremors, seizures, syncope, facial asymmetry, speech difficulty, weakness, light-headedness, numbness and headaches.  Psychiatric/Behavioral:  Negative for confusion.     Physical Exam Updated Vital Signs BP (!) 170/64 (BP Location: Left Arm)   Pulse 96   SpO2 93%  Physical Exam Vitals and nursing note reviewed. Exam  conducted with a chaperone present (Female RN present as chaperone).  Constitutional:      General: She is not in acute distress.    Appearance: She is not ill-appearing, toxic-appearing or diaphoretic.  HENT:     Head: Normocephalic. No raccoon eyes, Battle's sign, abrasion, contusion, masses, right periorbital erythema, left periorbital erythema or laceration.      Comments: Hematoma to occipital region of scalp as indicated above Eyes:     General: No scleral icterus.       Right eye: No discharge.        Left eye: No discharge.     Extraocular Movements: Extraocular movements intact.     Conjunctiva/sclera: Conjunctivae normal.     Pupils: Pupils are equal, round, and reactive to light.  Cardiovascular:     Rate and Rhythm: Normal rate.     Pulses:          Radial pulses are 2+ on the right side and 2+ on the left side.  Pulmonary:     Effort: Pulmonary effort is normal. No tachypnea, bradypnea or respiratory distress.  Chest:     Chest wall: No mass, deformity, swelling, tenderness, crepitus or edema.  Abdominal:     General: Abdomen is flat. There is no distension. There are no signs of injury.     Palpations: Abdomen is soft. There is no mass or pulsatile mass.     Tenderness: There is no abdominal tenderness. There is no guarding or rebound.  Musculoskeletal:     Comments: No midline tenderness or deformity to thoracic or lumbar spine.  No deformity, tenderness, point tenderness to bilateral upper or lower extremities.  Patient has full active range of motion to bilateral upper and lower extremities without difficulty or complaints of pain.  Pelvis stable.  No leg length discrepancy.  Skin:    General: Skin is warm and dry.  Neurological:     General: No focal deficit present.     Mental Status: She is alert.  Psychiatric:        Behavior: Behavior is cooperative.     ED Results / Procedures / Treatments   Labs (all labs ordered are listed, but only abnormal results  are displayed) Labs Reviewed  BASIC METABOLIC PANEL - Abnormal; Notable for the following components:      Result Value   Sodium 132 (*)    Glucose, Bld 107 (*)    GFR, Estimated 55 (*)    All other components within normal limits  CBC WITH DIFFERENTIAL/PLATELET - Abnormal; Notable for the following components:   RBC 2.84 (*)    Hemoglobin 9.3 (*)    HCT 28.3 (*)    RDW 20.5 (*)    Platelets 75 (*)    nRBC 2.0 (*)    nRBC 2 (*)    All other components within normal limits    EKG EKG Interpretation  Date/Time:  Monday October 20 2021 12:28:18 EDT Ventricular Rate:  85 PR Interval:  138 QRS Duration: 130 QT Interval:  394 QTC Calculation: 469 R Axis:   64 Text Interpretation: Sinus rhythm IVCD, consider atypical LBBB No sig change from Sep 16 2021 tracing, LBBB pattern seen on prior Confirmed by Octaviano Glow [phone]) on 10/20/2021 12:57:39 PM  Radiology No results found.  Procedures Procedures    Medications Ordered in ED Medications - No data to display  ED Course/ Medical Decision Making/ A&P                           Medical Decision Making Amount and/or Complexity of Data Reviewed Labs: ordered. Radiology: ordered.   Alert 88 year old female in no acute distress, nontoxic-appearing.  Presents to the ED with a complaint of mechanical fall and neck pain  Information was obtained from patient, patient's daughter, and facility staff.  I reviewed patient's past medical records including previous provider notes, labs, and imaging.  Patient has medical history as outlined in HPI which complicates her care.  Due to fall on blood thinners will obtain noncontrast head CT.  Additionally will obtain noncontrast cervical spine CT due to patient's report of neck pain.  Patient will remain immobilized via c-collar until C-spine is cleared.  Will check EKG and basic lab work as well as chest x-ray  I personally viewed and interpreted patient's EKG.  Tracing shows Sinus rhythm  IVCD.  I personally viewed interpret patient's lab results.  Pertinent findings include: -Thrombocytopenia improved from previous -Anemia with labs stable from 6 days prior -BMP unremarkable  I personally viewed and interpreted patient's CT imaging.  Agree with radiology interpretation of: -No acute intracranial abnormality -No acute fracture or traumatic listhesis in cervical spine -Sclerotic and lytic lesions of the cervical spine concerning for osseous metastatic disease or myeloma  On serial reexamination patient is alert and oriented in no acute distress.  Patient hemodynamically stable.  Patient is followed by Dr. Lorenso Courier with heme-onc we will have patient follow-up with him for further management of sclerotic and lytic lesions noted on CT imaging.  Patient to follow-up with PCP for further management of her chronic anemia.  Patient care discussed with attending physician Dr. Langston Masker  Based on patient's chief complaint, I considered admission might be necessary, however after reassuring ED workup feel patient is reasonable for discharge.  Discussed results, findings, treatment and follow up. Patient and patient's daughter advised of return precautions. Patient and patient's daughter verbalized understanding and agreed with plan.  Portions of this note were generated with Lobbyist. Dictation errors may occur despite best attempts at proofreading.         Final Clinical Impression(s) / ED Diagnoses Final diagnoses:  Fall, initial encounter  Injury of head, initial encounter    Rx / DC Orders ED Discharge Orders     None         Dyann Ruddle 10/20/21 1637    Wyvonnia Dusky, MD 10/21/21 [phone]

## 2021-10-20 NOTE — Progress Notes (Signed)
Orthopedic Tech Progress Note Patient Details:  [name] 1930-05-07 [id number]  Level 2 trauma   Patient ID: [name], female   DOB: [birth year], 88 y.o.   MRN: [id number]  [name] 10/20/2021, 12:11 PM

## 2021-10-20 NOTE — ED Notes (Signed)
Provider made aware of the patient's increased confusion per the nurse tech at the bedside upon this RN walking onto her shift

## 2021-10-20 NOTE — Telephone Encounter (Signed)
No entry 

## 2021-10-20 NOTE — ED Notes (Signed)
Trauma Response Nurse Documentation  Sarah Hampton is a 88 y.o. female arriving to [facility] ED via EMS  On clopidogrel 75 mg daily. Trauma was activated as a Level 2 based on the following trauma criteria Elderly patients > 65 with head trauma on anti-coagulation (excluding ASA). Trauma team at the bedside on patient arrival. Patient cleared for CT by PA. Patient to CT with team. GCS 14. Patient with baseline dementia, does not remember how she fell but believes it was mechanical. [facility], C-collar placed by EMS.  History   Past Medical History:  Diagnosis Date   Arthritis    History of melanoma    shoulder   History of squamous cell carcinoma    History of TIA (transient ischemic attack)    Hypertension    Macrocytic anemia    Mini stroke    Per [place] New Patient Packet    Other hyperlipidemia    Personal history of malignant neoplasm of other organs and systems    Seizure [facility])      Past Surgical History:  Procedure Laterality Date   CATARACT EXTRACTION     DENTAL SURGERY     SKIN SURGERY  07/2018   SQUAMOUS CELL CARCINOMA EXCISION  09/10/2021   TONSILLECTOMY  1946   Per [facility] New Patient Packet      Initial Focused Assessment (If applicable, or please see trauma documentation): A&O, GCS 14 baseline dementia Small head abrasion, no other trauma identified  CT's Completed:   CT Head and CT C-Spine   Interventions:  CXR Labs requested by daughter CT Head/Cspine  Plan for disposition:  Discharge home   Event Summary: Patient from [facility] baseline dementia with few details about event. Daughter arrived and provided history. Imaging all negative for traumatic injury, patient will most likely discharge back to [facility].  Bedside handoff with ED RN [facility].    Park Pope Kiante Petrovich  Trauma Response RN  Please call TRN at [phone] for further assistance.

## 2021-10-20 NOTE — Discharge Instructions (Addendum)
Please follow-up with your primary care doctor for repeat evaluation.  Please follow-up with your hematology/oncology for further evaluation of Sclerotic and lytic lesions in the cervical spine, concerning for osseous metastatic disease or myeloma.  Get help right away if: You have: A severe headache that is not helped by medicine. Trouble walking or weakness in your arms and legs. Clear or bloody fluid coming from your nose or ears. Changes in your vision. A seizure. Increased confusion or irritability. Your symptoms get worse. You are sleepier than normal and have trouble staying awake. You lose your balance. Your pupils change size. Your speech is slurred. Your dizziness gets worse. You vomit.

## 2021-10-20 NOTE — ED Triage Notes (Signed)
PT arriving via [facility] from [facility]. Pt experienced an unwitnessed fall in the bathroom. Pt fell and hit the back of her head. Pt is currently taking Plavix. Pt has minor swelling on back of the head. PT c/o back of neck pain , Aox1 and has a hx of dementia.   Last VS  140/54  HR 90  RR20  93% RA   Daughter of the pt states that the pt is a DNR, but no DNR papers were found at the facility that she came from.

## 2021-10-21 ENCOUNTER — Telehealth: Payer: Self-pay

## 2021-10-21 DIAGNOSIS — M899 Disorder of bone, unspecified: Secondary | ICD-10-CM

## 2021-10-21 NOTE — Telephone Encounter (Signed)
Daughter called to advise that pt fell and had ED visit on 10/20/21. Daughter concerned regarding imaging done resulting in the following:   "Sclerotic and lytic lesions in the cervical spine, concerning for osseous metastatic disease or myeloma."  Requesting to speak with Murray Hodgkins PA if at all possible. Patient does not have MD/NP visit scheduled at this time but does have lab and infusion apt for blood on 10/24/21.  Routed to [employer] PA

## 2021-10-21 NOTE — Addendum Note (Signed)
Addended by: Dede Query T on: 10/21/2021 01:46 PM   Modules accepted: Orders

## 2021-10-21 NOTE — Telephone Encounter (Signed)
I spoke to patient's daughter, [name], to discuss the recent CT findings from yesterday's ED visit. Findings show sclerotic and lytic lesions in the cervical spine concerning for metastatic disease or myeloma.   Recommend a CT CAP to evaluate for other areas of malignancy. If unremarkable, we will request IR to add a bone biopsy of one of the target lesions along with a bone marrow biopsy that is scheduled for 11/03/2021.   Debbie expressed understanding of the plan provided

## 2021-10-22 ENCOUNTER — Telehealth: Payer: Self-pay | Admitting: *Deleted

## 2021-10-22 ENCOUNTER — Ambulatory Visit (HOSPITAL_COMMUNITY)
Admission: RE | Admit: 2021-10-22 | Discharge: 2021-10-22 | Disposition: A | Discharge status: Home / Self Care | Payer: Medicare HMO | Source: Ambulatory Visit | Attending: Physician Assistant | Admitting: Physician Assistant

## 2021-10-22 DIAGNOSIS — M899 Disorder of bone, unspecified: Secondary | ICD-10-CM | POA: Diagnosis not present

## 2021-10-22 DIAGNOSIS — R2689 Other abnormalities of gait and mobility: Secondary | ICD-10-CM | POA: Diagnosis not present

## 2021-10-22 DIAGNOSIS — I2510 Atherosclerotic heart disease of native coronary artery without angina pectoris: Secondary | ICD-10-CM | POA: Diagnosis not present

## 2021-10-22 DIAGNOSIS — R911 Solitary pulmonary nodule: Secondary | ICD-10-CM | POA: Insufficient documentation

## 2021-10-22 DIAGNOSIS — I700 Atherosclerosis of aorta: Secondary | ICD-10-CM | POA: Diagnosis not present

## 2021-10-22 DIAGNOSIS — R937 Abnormal findings on diagnostic imaging of other parts of musculoskeletal system: Secondary | ICD-10-CM | POA: Insufficient documentation

## 2021-10-22 MED ORDER — IOHEXOL 300 MG/ML  SOLN
75.0000 mL | Freq: Once | INTRAMUSCULAR | Status: AC | PRN
  Administered 2021-10-22: 75 mL via INTRAVENOUS

## 2021-10-22 NOTE — Telephone Encounter (Signed)
Received call from pt's daughter-asking some questions about her mom drinking the contrast. She is asking if she has to drink all of it. Advised that it is best if she can drink it all but she just needs to do the best she can. Hopefully radiology with get adequate films with whatever she is able to take in. Daughter voiced understanding.

## 2021-10-23 ENCOUNTER — Other Ambulatory Visit: Payer: Self-pay | Admitting: Physician Assistant

## 2021-10-23 DIAGNOSIS — M899 Disorder of bone, unspecified: Secondary | ICD-10-CM

## 2021-10-24 ENCOUNTER — Inpatient Hospital Stay: Payer: Medicare HMO

## 2021-10-24 ENCOUNTER — Other Ambulatory Visit: Payer: Self-pay

## 2021-10-24 ENCOUNTER — Inpatient Hospital Stay: Payer: Medicare HMO | Attending: Physician Assistant

## 2021-10-24 DIAGNOSIS — D539 Nutritional anemia, unspecified: Secondary | ICD-10-CM | POA: Diagnosis not present

## 2021-10-24 LAB — CBC WITH DIFFERENTIAL (CANCER CENTER ONLY)
Abs Immature Granulocytes: 0.42 10*3/uL — ABNORMAL HIGH (ref 0.00–0.07)
Basophils Absolute: 0.1 10*3/uL (ref 0.0–0.1)
Basophils Relative: 1 %
Eosinophils Absolute: 0.1 10*3/uL (ref 0.0–0.5)
Eosinophils Relative: 2 %
HCT: 24.7 % — ABNORMAL LOW (ref 36.0–46.0)
Hemoglobin: 8 g/dL — ABNORMAL LOW (ref 12.0–15.0)
Immature Granulocytes: 10 %
Lymphocytes Relative: 24 %
Lymphs Abs: 1 10*3/uL (ref 0.7–4.0)
MCH: 31.9 pg (ref 26.0–34.0)
MCHC: 32.4 g/dL (ref 30.0–36.0)
MCV: 98.4 fL (ref 80.0–100.0)
Monocytes Absolute: 0.4 10*3/uL (ref 0.1–1.0)
Monocytes Relative: 10 %
Neutro Abs: 2.4 10*3/uL (ref 1.7–7.7)
Neutrophils Relative %: 53 %
Platelet Count: 58 10*3/uL — ABNORMAL LOW (ref 150–400)
RBC: 2.51 MIL/uL — ABNORMAL LOW (ref 3.87–5.11)
RDW: 20.6 % — ABNORMAL HIGH (ref 11.5–15.5)
WBC Count: 4.4 10*3/uL (ref 4.0–10.5)
nRBC: 0.9 % — ABNORMAL HIGH (ref 0.0–0.2)

## 2021-10-24 LAB — SAMPLE TO BLOOD BANK

## 2021-10-24 LAB — PREPARE RBC (CROSSMATCH)

## 2021-10-24 MED ORDER — DIPHENHYDRAMINE HCL 25 MG PO CAPS
25.0000 mg | ORAL_CAPSULE | Freq: Once | ORAL | Status: AC
  Administered 2021-10-24: 25 mg via ORAL
  Filled 2021-10-24: qty 1

## 2021-10-24 MED ORDER — SODIUM CHLORIDE 0.9% IV SOLUTION
250.0000 mL | Freq: Once | INTRAVENOUS | Status: AC
  Administered 2021-10-24: 250 mL via INTRAVENOUS

## 2021-10-24 MED ORDER — ACETAMINOPHEN 325 MG PO TABS
650.0000 mg | ORAL_TABLET | Freq: Once | ORAL | Status: DC
  Filled 2021-10-24: qty 2

## 2021-10-24 NOTE — Patient Instructions (Signed)
Blood Transfusion, Adult A blood transfusion is a procedure in which you receive blood or a type of blood cell (blood component) through an IV. You may need a blood transfusion when your blood level is low. This may result from a bleeding disorder, illness, injury, or surgery. The blood may come from a donor. You may also be able to donate blood for yourself (autologous blood donation) before a planned surgery. The blood given in a transfusion is made up of different blood components. You may receive: Red blood cells. These carry oxygen to the cells in the body. Platelets. These help your blood to clot. Plasma. This is the liquid part of your blood. It carries proteins and other substances throughout the body. White blood cells. These help you fight infections. If you have hemophilia or another clotting disorder, you may also receive other types of blood products. Tell a health care provider about: Any blood disorders you have. Any previous reactions you have had during a blood transfusion. Any allergies you have. All medicines you are taking, including vitamins, herbs, eye drops, creams, and over-the-counter medicines. Any surgeries you have had. Any medical conditions you have, including any recent fever or cold symptoms. Whether you are pregnant or may be pregnant. What are the risks? Generally, this is a safe procedure. However, problems may occur. The most common problems include: A mild allergic reaction, such as red, swollen areas of skin (hives) and itching. Fever or chills. This may be the body's response to new blood cells received. This may occur during or up to 4 hours after the transfusion. More serious problems may include: Transfusion-associated circulatory overload (TACO), or too much fluid in the lungs. This may cause breathing problems. A serious allergic reaction, such as difficulty breathing or swelling around the face and lips. Transfusion-related acute lung injury  (TRALI), which causes breathing difficulty and low oxygen in the blood. This can occur within hours of the transfusion or several days later. Iron overload. This can happen after receiving many blood transfusions over a period of time. Infection or virus being transmitted. This is rare because donated blood is carefully tested before it is given. Hemolytic transfusion reaction. This is rare. It happens when your body's defense system (immune system)tries to attack the new blood cells. Symptoms may include fever, chills, nausea, low blood pressure, and low back or chest pain. Transfusion-associated graft-versus-host disease (TAGVHD). This is rare. It happens when donated cells attack your body's healthy tissues. What happens before the procedure? Medicines Ask your health care provider about: Changing or stopping your regular medicines. This is especially important if you are taking diabetes medicines or blood thinners. Taking medicines such as aspirin and ibuprofen. These medicines can thin your blood. Do not take these medicines unless your health care provider tells you to take them. Taking over-the-counter medicines, vitamins, herbs, and supplements. General instructions Follow instructions from your health care provider about eating and drinking restrictions. You will have a blood test to determine your blood type. This is necessary to know what kind of blood your body will accept and to match it to the donor blood. If you are going to have a planned surgery, you may be able to do an autologous blood donation. This may be done in case you need to have a transfusion. You will have your temperature, blood pressure, and pulse monitored before the transfusion. If you have had an allergic reaction to a transfusion in the past, you may be given medicine to help prevent   a reaction. This medicine may be given to you by mouth (orally) or through an IV. Set aside time for the blood transfusion. This  procedure generally takes 1-4 hours to complete. What happens during the procedure?  An IV will be inserted into one of your veins. The bag of donated blood will be attached to your IV. The blood will then enter through your vein. Your temperature, blood pressure, and pulse will be monitored regularly during the transfusion. This monitoring is done to detect early signs of a transfusion reaction. Tell your nurse right away if you have any of these symptoms during the transfusion: Shortness of breath or trouble breathing. Chest or back pain. Fever or chills. Hives or itching. If you have any signs or symptoms of a reaction, your transfusion will be stopped and you may be given medicine. When the transfusion is complete, your IV will be removed. Pressure may be applied to the IV site for a few minutes. A bandage (dressing)will be applied. The procedure may vary among health care providers and hospitals. What happens after the procedure? Your temperature, blood pressure, pulse, breathing rate, and blood oxygen level will be monitored until you leave the hospital or clinic. Your blood may be tested to see how you are responding to the transfusion. You may be warmed with fluids or blankets to maintain a normal body temperature. If you receive your blood transfusion in an outpatient setting, you will be told whom to contact to report any reactions. Where to find more information For more information on blood transfusions, visit the [employer]: redcross.org Summary A blood transfusion is a procedure in which you receive blood or a type of blood cell (blood component) through an IV. The blood you receive may come from a donor or be donated by yourself (autologous blood donation) before a planned surgery. The blood given in a transfusion is made up of different blood components. You may receive red blood cells, platelets, plasma, or white blood cells depending on the condition treated. Your  temperature, blood pressure, and pulse will be monitored before, during, and after the transfusion. After the transfusion, your blood may be tested to see how your body has responded. This information is not intended to replace advice given to you by your health care provider. Make sure you discuss any questions you have with your health care provider. Document Revised: 01/12/2019 Document Reviewed: 09/01/2018 Elsevier Patient Education  2023 [employer]  

## 2021-10-25 LAB — BPAM RBC
Blood Product Expiration Date: 202308312359
ISSUE DATE / TIME: 202308040952
Unit Type and Rh: 600

## 2021-10-25 LAB — TYPE AND SCREEN
ABO/RH(D): A NEG
Antibody Screen: NEGATIVE
Unit division: 0

## 2021-10-28 NOTE — Progress Notes (Unsigned)
Michaelle Birks, MD  Mee Hives D Cc: Orson Slick, MD Good morning all.   Pardon the delayed response, I was off for 2 wks for a family trip.   I have reviewed imaging for Pt [place]. There is no discrete lesion to target, as mentioned in the report, and essentially a diffuse process.   I will definitely perform the BMBx, which in my mind should be sufficient, but don't have anything else to target.   Thoughts and comments appreciated.  Thank you for allowing me to participate in the care of your Patient.   Michaelle Birks, MD  [facility]  [facility]   Cell.    [phone]  Pager. [place]

## 2021-10-28 NOTE — Progress Notes (Unsigned)
Sarah Serge, MD; Donita Brooks D Cc: Orson Slick, MD Thank you Dr. Maryelizabeth Kaufmann for the follow up.   I have updated patient's daughter that she will only proceed with bone marrow biopsy.   Thanks,  Murray Hodgkins

## 2021-10-29 DIAGNOSIS — R2689 Other abnormalities of gait and mobility: Secondary | ICD-10-CM | POA: Diagnosis not present

## 2021-10-31 ENCOUNTER — Other Ambulatory Visit: Payer: Self-pay | Admitting: Radiology

## 2021-10-31 DIAGNOSIS — D696 Thrombocytopenia, unspecified: Secondary | ICD-10-CM

## 2021-10-31 DIAGNOSIS — R2689 Other abnormalities of gait and mobility: Secondary | ICD-10-CM | POA: Diagnosis not present

## 2021-11-03 ENCOUNTER — Ambulatory Visit (HOSPITAL_COMMUNITY)
Admission: RE | Admit: 2021-11-03 | Discharge: 2021-11-03 | Disposition: A | Discharge status: Home / Self Care | Payer: Medicare HMO | Source: Ambulatory Visit | Attending: Physician Assistant | Admitting: Physician Assistant

## 2021-11-03 ENCOUNTER — Other Ambulatory Visit: Payer: Self-pay

## 2021-11-03 ENCOUNTER — Encounter (HOSPITAL_COMMUNITY): Payer: Self-pay

## 2021-11-03 DIAGNOSIS — D696 Thrombocytopenia, unspecified: Secondary | ICD-10-CM | POA: Insufficient documentation

## 2021-11-03 DIAGNOSIS — D649 Anemia, unspecified: Secondary | ICD-10-CM | POA: Diagnosis not present

## 2021-11-03 DIAGNOSIS — D539 Nutritional anemia, unspecified: Secondary | ICD-10-CM | POA: Diagnosis not present

## 2021-11-03 DIAGNOSIS — Z31438 Encounter for other genetic testing of female for procreative management: Secondary | ICD-10-CM | POA: Diagnosis not present

## 2021-11-03 DIAGNOSIS — D7581 Myelofibrosis: Secondary | ICD-10-CM | POA: Diagnosis not present

## 2021-11-03 LAB — CBC WITH DIFFERENTIAL/PLATELET
Abs Immature Granulocytes: 0.46 10*3/uL — ABNORMAL HIGH (ref 0.00–0.07)
Basophils Absolute: 0.1 10*3/uL (ref 0.0–0.1)
Basophils Relative: 2 %
Eosinophils Absolute: 0.1 10*3/uL (ref 0.0–0.5)
Eosinophils Relative: 2 %
HCT: 31.4 % — ABNORMAL LOW (ref 36.0–46.0)
Hemoglobin: 10 g/dL — ABNORMAL LOW (ref 12.0–15.0)
Immature Granulocytes: 11 %
Lymphocytes Relative: 23 %
Lymphs Abs: 1 10*3/uL (ref 0.7–4.0)
MCH: 31 pg (ref 26.0–34.0)
MCHC: 31.8 g/dL (ref 30.0–36.0)
MCV: 97.2 fL (ref 80.0–100.0)
Monocytes Absolute: 0.5 10*3/uL (ref 0.1–1.0)
Monocytes Relative: 11 %
Neutro Abs: 2.3 10*3/uL (ref 1.7–7.7)
Neutrophils Relative %: 51 %
Platelets: 53 10*3/uL — ABNORMAL LOW (ref 150–400)
RBC: 3.23 MIL/uL — ABNORMAL LOW (ref 3.87–5.11)
RDW: 21.6 % — ABNORMAL HIGH (ref 11.5–15.5)
WBC: 4.4 10*3/uL (ref 4.0–10.5)
nRBC: 0.5 % — ABNORMAL HIGH (ref 0.0–0.2)

## 2021-11-03 MED ORDER — MIDAZOLAM HCL 2 MG/2ML IJ SOLN
INTRAMUSCULAR | Status: AC
  Filled 2021-11-03: qty 2

## 2021-11-03 MED ORDER — FENTANYL CITRATE (PF) 100 MCG/2ML IJ SOLN
INTRAMUSCULAR | Status: AC
  Filled 2021-11-03: qty 2

## 2021-11-03 MED ORDER — SODIUM CHLORIDE 0.9 % IV SOLN: INTRAVENOUS | Status: DC

## 2021-11-03 MED ORDER — MIDAZOLAM HCL 2 MG/2ML IJ SOLN
INTRAMUSCULAR | Status: AC | PRN
  Administered 2021-11-03: .25 mg via INTRAVENOUS

## 2021-11-03 MED ORDER — FENTANYL CITRATE (PF) 100 MCG/2ML IJ SOLN
INTRAMUSCULAR | Status: AC | PRN
  Administered 2021-11-03: 25 ug via INTRAVENOUS

## 2021-11-03 NOTE — Progress Notes (Signed)
In - Person discussion with   patient's designated contact - Daughter, DJ. Discussed timeframes for pre/intra and post procedure. Verbalized understanding. Aware that call will be made post procedure to discuss pick up time and any further discharge instructions.    Of note, K. Allred, PA authorized for daughter to provide Keppra at 08:36 AM with a small sip of water. Checklist/PTA Med list updated to reflect.

## 2021-11-03 NOTE — Consult Note (Signed)
Chief Complaint: Patient was seen in consultation today for CT guided bone marrow biopsy  Referring Physician(s): Dorsey,J  Supervising Physician: Michaelle Birks  Patient Status: [facility] - Out-pt  History of Present Illness: Sarah Hampton is an 88 y.o. female with past medical history significant for arthritis, melanoma, squamous cell carcinoma of the skin, TIA, hypertension, hyperlipidemia and seizures who presents now with anemia and thrombocytopenia as well as sclerotic/lytic lesions in cervical spine.  She is scheduled today for CT-guided bone marrow biopsy for further evaluation.  Past Medical History:  Diagnosis Date   Arthritis    History of melanoma    shoulder   History of squamous cell carcinoma    History of TIA (transient ischemic attack)    Hypertension    Macrocytic anemia    Mini stroke    Per [facility] New Patient Packet    Other hyperlipidemia    Personal history of malignant neoplasm of other organs and systems    Seizure [facility])     Past Surgical History:  Procedure Laterality Date   CATARACT EXTRACTION     DENTAL SURGERY     SKIN SURGERY  07/2018   SQUAMOUS CELL CARCINOMA EXCISION  09/10/2021   TONSILLECTOMY  1946   Per [place] New Patient Packet     Allergies: Patient has no known allergies.  Medications: Prior to Admission medications   Medication Sig Start Date End Date Taking? Authorizing Provider  acetaminophen (TYLENOL) 325 MG tablet Take 650 mg by mouth every 6 (six) hours as needed for moderate pain or mild pain.    [provider]  atorvastatin (LIPITOR) 80 MG tablet TAKE 1 TABLET EVERY DAY  AT  6PM Patient taking differently: Take 80 mg by mouth daily. 05/08/21   Lauree Chandler, NP  Calcium Carbonate-Vitamin D 600-400 MG-UNIT tablet Take 1 tablet by mouth 2 (two) times daily. 09/20/20   Lauree Chandler, NP  clopidogrel (PLAVIX) 75 MG tablet TAKE 1 TABLET EVERY DAY Patient taking differently: Take 75 mg by mouth daily. 05/08/21    Lauree Chandler, NP  feeding supplement (ENSURE ENLIVE / ENSURE PLUS) LIQD DRINK 1-CAN (237MLS) BY MOUTH AFTER LUNCH Patient taking differently: Take 237 mLs by mouth See admin instructions. Daily after lunch 09/30/21   Lauree Chandler, NP  fluticasone (FLONASE) 50 MCG/ACT nasal spray Place 1 spray into both nostrils daily as needed for allergies.    [provider]  levETIRAcetam (KEPPRA) 250 MG tablet Take 1 tablet (250 mg total) by mouth 2 (two) times daily. 03/25/21 03/23/48  Alric Ran, MD  lisinopril (ZESTRIL) 10 MG tablet Take 10 mg by mouth daily. 09/29/21   [provider]  loperamide (IMODIUM) 2 MG capsule Take 4 mg by mouth as needed for diarrhea or loose stools.    [provider]  memantine (NAMENDA) 5 MG tablet Take 1 tablet (5 mg total) by mouth 2 (two) times daily. 03/25/21 03/23/48  Alric Ran, MD  vitamin B-12 (CYANOCOBALAMIN) 1000 MCG tablet Take 1 tablet (1,000 mcg total) by mouth daily. 12/26/20   Lauree Chandler, NP     Family History  Problem Relation Age of Onset   Dementia Mother 33   Breast cancer Mother    Pneumonia Father 36   Cancer Father    Thyroid disease Daughter    Thyroid disease Daughter    Stroke Neg Hx     Social History   Socioeconomic History   Marital status: Widowed  Spouse name: Not on file   Number of children: Not on file   Years of education: Not on file   Highest education level: Not on file  Occupational History   Occupation: retired  Tobacco Use   Smoking status: Former    Packs/day: 0.50    Years: 30.00    Total pack years: 15.00    Types: Cigarettes    Quit date: 09/28/1968    Years since quitting: 53.1   Smokeless tobacco: Never  Vaping Use   Vaping Use: Never used  Substance and Sexual Activity   Alcohol use: Not Currently   Drug use: Never   Sexual activity: Not on file  Other Topics Concern   Not on file  Social History Narrative   Per [facility] Patient Packet 01/13/2019        Diet: No answer       Caffeine: Coffee       Married, if yes what year: Widowed, married in [place] you live in a house, apartment, assisted living, condo, trailer, ect: One stories, one person (apartment)       Pets: No      Current/Past profession: Museum/gallery conservator       Exercise:Yes, 5-6 x weekly          Living Will: Yes   DNR: Yes   POA/HPOA: Yes      Functional Status:   Do you have difficulty bathing or dressing yourself?No   Do you have difficulty preparing food or eating? No   Do you have difficulty managing your medications? No   Do you have difficulty managing your finances? No   Do you have difficulty affording your medications? No   Social Determinants of Radio broadcast assistant Strain: Not on file  Food Insecurity: Not on file  Transportation Needs: Not on file  Physical Activity: Not on file  Stress: Not on file  Social Connections: Not on file      Review of Systems denies fever, headache, chest pain, dyspnea, cough, abdominal/back pain, nausea, vomiting or bleeding.  She is hard of hearing.  Vital Signs: Vitals:   11/03/21 0820  BP: (!) 152/54  Pulse: 72  Resp: 16  Temp: 97.7 F (36.5 C)  SpO2: 100%          Physical Exam awake, alert.  Chest clear to auscultation bilaterally.  Heart with regular rate and rhythm.  Abdomen soft, positive bowel sounds, nontender.  Trace to 1+ pretibial edema bilaterally.  Imaging: CT CHEST ABDOMEN PELVIS W CONTRAST  Result Date: 10/22/2021 CLINICAL DATA:  Evaluate for metastatic disease * Tracking Code: BO * EXAM: CT CHEST, ABDOMEN, AND PELVIS WITH CONTRAST TECHNIQUE: Multidetector CT imaging of the chest, abdomen and pelvis was performed following the standard protocol during bolus administration of intravenous contrast. RADIATION DOSE REDUCTION: This exam was performed according to the departmental dose-optimization program which includes automated exposure control, adjustment of the mA  and/or kV according to patient size and/or use of iterative reconstruction technique. CONTRAST:  64mL OMNIPAQUE IOHEXOL 300 MG/ML SOLN additional oral enteric contrast COMPARISON:  CT brain and cervical spine, 10/20/2021 FINDINGS: CT CHEST FINDINGS Cardiovascular: Aortic atherosclerosis. Normal heart size. Left right coronary artery calcifications. Dense mitral valve calcifications. No pericardial effusion. Mediastinum/Nodes: No enlarged mediastinal, hilar, or axillary lymph nodes. Benign, densely calcified left hilar lymph nodes (series 2, image 25). Thyroid gland, trachea, and esophagus demonstrate no significant findings. Lungs/Pleura: Benign calcified nodule  of the anterior left upper lobe. No pleural effusion or pneumothorax. Musculoskeletal: No chest wall mass. CT ABDOMEN PELVIS FINDINGS Hepatobiliary: No solid liver abnormality is seen. No gallstones, gallbladder wall thickening, or biliary dilatation. Pancreas: Unremarkable. No pancreatic ductal dilatation or surrounding inflammatory changes. Spleen: Normal in size without significant abnormality. Adrenals/Urinary Tract: Adrenal glands are unremarkable. Kidneys are normal, without renal calculi, solid lesion, or hydronephrosis. Bladder is unremarkable. Stomach/Bowel: Stomach is within normal limits. Appendix appears normal. No evidence of bowel wall thickening, distention, or inflammatory changes. Vascular/Lymphatic: Aortic atherosclerosis. No enlarged abdominal or pelvic lymph nodes. Reproductive: No mass or other abnormality. Other: No abdominal wall hernia or abnormality. No ascites. Musculoskeletal: Diffusely sclerotic and heterogeneous osseous structures throughout. IMPRESSION: 1. Diffusely sclerotic and heterogeneous osseous structures throughout, most consistent with multiple myeloma or perhaps diffuse osseous metastatic disease, however without candidate primary lesion identified in the chest, abdomen, or pelvis. Renal osteodystrophy is in general a  benign differential consideration for this appearance if there is known chronic end-stage renal disease 2. No other evidence of primary malignancy or metastatic disease in the chest, abdomen, or pelvis. 3. Coronary artery disease. Aortic Atherosclerosis (ICD10-I70.0). Electronically Signed   By: Delanna Ahmadi M.D.   On: 10/22/2021 18:11   CT HEAD WO CONTRAST (5MM)  Result Date: 10/20/2021 CLINICAL DATA:  Unwitnessed fall EXAM: CT HEAD WITHOUT CONTRAST CT CERVICAL SPINE WITHOUT CONTRAST TECHNIQUE: Multidetector CT imaging of the head and cervical spine was performed following the standard protocol without intravenous contrast. Multiplanar CT image reconstructions of the cervical spine were also generated. RADIATION DOSE REDUCTION: This exam was performed according to the departmental dose-optimization program which includes automated exposure control, adjustment of the mA and/or kV according to patient size and/or use of iterative reconstruction technique. COMPARISON:  09/28/2017, 04/15/2020 CTA head neck FINDINGS: CT HEAD FINDINGS Brain: No evidence of acute infarct, hemorrhage, mass, mass effect, or midline shift. No hydrocephalus or extra-axial fluid collection. Periventricular white matter changes, likely the sequela of chronic small vessel ischemic disease. Unchanged cerebral volume loss. Vascular: No hyperdense vessel. Skull: Normal. Negative for fracture or focal lesion. Sinuses/Orbits: No acute finding. Status post bilateral lens replacements. Other: The mastoid air cells are well aerated. CT CERVICAL SPINE FINDINGS Alignment: Reversal of the normal cervical lordosis. No new listhesis. Redemonstrated trace retrolisthesis of C4 on C5 and C5 on C6. Skull base and vertebrae: Heterogeneously increased osseous density with focal lucent areas throughout the cervical spine, which have progressed compared to 09/28/2017 and 04/15/2020. Soft tissues and spinal canal: No prevertebral fluid or swelling. No visible  canal hematoma. Disc levels: Multilevel degenerative changes without evidence of high-grade spinal canal stenosis. Multilevel uncovertebral and facet arthropathy which causes up to severe neural foraminal narrowing on the right at C3-C4 and bilaterally at C5-C6. Upper chest: Apical pleural-parenchymal scarring. No focal pulmonary opacity or pleural effusion. Other: None. IMPRESSION: 1. Sclerotic and lytic lesions in the cervical spine, concerning for osseous metastatic disease or myeloma. 2.  No acute intracranial process. 3.  No acute fracture or traumatic listhesis in the cervical spine. Electronically Signed   By: Merilyn Baba M.D.   On: 10/20/2021 13:05   CT Cervical Spine Wo Contrast  Result Date: 10/20/2021 CLINICAL DATA:  Unwitnessed fall EXAM: CT HEAD WITHOUT CONTRAST CT CERVICAL SPINE WITHOUT CONTRAST TECHNIQUE: Multidetector CT imaging of the head and cervical spine was performed following the standard protocol without intravenous contrast. Multiplanar CT image reconstructions of the cervical spine were also generated. RADIATION DOSE REDUCTION: This  exam was performed according to the departmental dose-optimization program which includes automated exposure control, adjustment of the mA and/or kV according to patient size and/or use of iterative reconstruction technique. COMPARISON:  09/28/2017, 04/15/2020 CTA head neck FINDINGS: CT HEAD FINDINGS Brain: No evidence of acute infarct, hemorrhage, mass, mass effect, or midline shift. No hydrocephalus or extra-axial fluid collection. Periventricular white matter changes, likely the sequela of chronic small vessel ischemic disease. Unchanged cerebral volume loss. Vascular: No hyperdense vessel. Skull: Normal. Negative for fracture or focal lesion. Sinuses/Orbits: No acute finding. Status post bilateral lens replacements. Other: The mastoid air cells are well aerated. CT CERVICAL SPINE FINDINGS Alignment: Reversal of the normal cervical lordosis. No new  listhesis. Redemonstrated trace retrolisthesis of C4 on C5 and C5 on C6. Skull base and vertebrae: Heterogeneously increased osseous density with focal lucent areas throughout the cervical spine, which have progressed compared to 09/28/2017 and 04/15/2020. Soft tissues and spinal canal: No prevertebral fluid or swelling. No visible canal hematoma. Disc levels: Multilevel degenerative changes without evidence of high-grade spinal canal stenosis. Multilevel uncovertebral and facet arthropathy which causes up to severe neural foraminal narrowing on the right at C3-C4 and bilaterally at C5-C6. Upper chest: Apical pleural-parenchymal scarring. No focal pulmonary opacity or pleural effusion. Other: None. IMPRESSION: 1. Sclerotic and lytic lesions in the cervical spine, concerning for osseous metastatic disease or myeloma. 2.  No acute intracranial process. 3.  No acute fracture or traumatic listhesis in the cervical spine. Electronically Signed   By: Merilyn Baba M.D.   On: 10/20/2021 13:05   DG Chest Portable 1 View  Result Date: 10/20/2021 CLINICAL DATA:  Fall striking head, on Plavix, unwitnessed fall in bathroom striking back of head EXAM: PORTABLE CHEST 1 VIEW COMPARISON:  Portable exam 1221 hours compared to 03/10/2021 FINDINGS: Normal heart size, mediastinal contours, and pulmonary vascularity. Atherosclerotic calcification aorta. Lungs clear. No pulmonary infiltrate, pleural effusion, or pneumothorax. Diffuse osseous demineralization with thoracolumbar scoliosis. IMPRESSION: No acute abnormalities. Aortic Atherosclerosis (ICD10-I70.0). Electronically Signed   By: Lavonia Dana M.D.   On: 10/20/2021 12:28   US Abdomen Complete  Result Date: 10/15/2021 CLINICAL DATA:  Anemia. Thrombocytopenia. Liver disease and splenomegaly. EXAM: ABDOMEN ULTRASOUND COMPLETE COMPARISON:  KUB 09/17/2021 FINDINGS: Gallbladder: No gallstones or wall thickening visualized. No sonographic Murphy sign noted by sonographer. Common  bile duct: Diameter: 4 mm, within normal limits. No intrahepatic or extrahepatic biliary ductal dilatation. Liver: No focal lesion identified. Within normal limits in parenchymal echogenicity. Portal vein is patent on color Doppler imaging with normal direction of blood flow towards the liver. IVC: No abnormality visualized. Pancreas: Visualized portion unremarkable. Spleen: 13.1 x 8.7 x 12.5 cm (747 mL). There is an echogenic possibly mildly shadowing focus measuring up to 6 mm within the lateral aspect of the spleen. This may represent a benign calcification. No definitive surrounding mass is visualized. Right Kidney: Length: 8.3 cm. Echogenicity within normal limits. No mass or hydronephrosis visualized. Left Kidney: Length: 9.1 cm. Echogenicity within normal limits. No mass or hydronephrosis visualized. Abdominal aorta: No aneurysm visualized. The distal abdominal aorta and the iliac bifurcation are not visualized due to overlying bowel gas. Atherosclerosis is seen within the proximal and mid aorta. Other findings: None. IMPRESSION: 1. Normal appearance of the gallbladder and bile ducts. 2. Mildly enlarged spleen. Echogenic focus measuring up to 6 mm within the lateral aspect of the spleen, likely a benign calcification. Electronically Signed   By: Yvonne Kendall M.D.   On: 10/15/2021 10:01  Labs:  CBC: Recent Labs    10/08/21 1238 10/14/21 0805 10/20/21 1229 10/24/21 0745  WBC 4.7 5.0 7.0 4.4  HGB 7.4* 9.4* 9.3* 8.0*  HCT 22.5* 29.8* 28.3* 24.7*  PLT 71* 53* 75* 58*    COAGS: Recent Labs    09/17/21 0443  INR 1.1    BMP: Recent Labs    09/16/21 1341 09/17/21 0443 09/22/21 1338 10/08/21 1238 10/20/21 1229  NA 134* 137 136 132* 132*  K 4.3 4.3 4.8 4.6 4.6  CL 101 105 102 101 99  CO2 _0 GLUCOSE 135* 106* 95 106* 107*  BUN 22 25* _1 CALCIUM 9.2 8.8* 9.5 9.5 9.3  CREATININE 0.91 1.00 1.02* 0.85 0.98  GFRNONAA >60 54*  --  >60 55*    LIVER FUNCTION  TESTS: Recent Labs    03/16/21 2043 05/16/21 1552 09/16/21 1341 09/17/21 0443 09/22/21 1338 10/08/21 1238  BILITOT 0.8   < > 1.0 2.2*  2.5* 0.8 0.7  AST 25   < > _2 ALT 21   < > _3 ALKPHOS 143*  --  126 133*  --  145*  PROT 6.3*   < > 6.4* 6.4* 6.1 6.0*  ALBUMIN 4.1  --  4.2 4.1  --  4.1   < > = values in this interval not displayed.    TUMOR MARKERS: No results for input(s): "AFPTM", "CEA", "CA199", "CHROMGRNA" in the last 8760 hours.  Assessment and Plan: 88 y.o. female with past medical history significant for arthritis, melanoma, squamous cell carcinoma of the skin, TIA, hypertension, hyperlipidemia and seizures who presents now with anemia and thrombocytopenia as well as sclerotic/lytic lesions in cervical spine.  She is scheduled today for CT-guided bone marrow biopsy for further evaluation.Risks and benefits of procedure was discussed with the patient /daughter including, but not limited to bleeding, infection, damage to adjacent structures or low yield requiring additional tests.  All of the questions were answered and there is agreement to proceed.  Consent signed and in chart.    Thank you for this interesting consult.  I greatly enjoyed meeting Sarah Hampton and look forward to participating in their care.  A copy of this report was sent to the requesting provider on this date.  Electronically Signed: D. Rowe Robert, PA-C 11/03/2021, 8:02 AM   I spent a total of  20 minutes   in face to face in clinical consultation, greater than 50% of which was counseling/coordinating care for CT-guided bone marrow biopsy

## 2021-11-03 NOTE — Procedures (Signed)
Vascular and Interventional Radiology Procedure Note  Patient: Sarah Hampton DOB: 07-20-1930 Medical Record Number: [id number] Note Date/Time: 11/03/21 8:57 AM   Performing Physician: Michaelle Birks, MD Assistant(s): None  Diagnosis: Thrombocytopenia  Procedure: BONE MARROW ASPIRATION and BIOPSY  Anesthesia: Conscious Sedation Complications: None Estimated Blood Loss: Minimal Specimens: Sent for Pathology  Findings:  Successful CT-guided bone marrow aspiration and biopsy A total of 2 cores were obtained. Hemostasis of the tract was achieved using Manual Pressure.  Plan: Bed rest for 1 hours.  See detailed procedure note with images in PACS. The patient tolerated the procedure well without incident or complication and was returned to Recovery in stable condition.    Michaelle Birks, MD [facility] Radiology   Pager. [place]

## 2021-11-03 NOTE — Discharge Instructions (Signed)
Please call Interventional Radiology clinic [phone] with any questions or concerns.  You may remove your dressing and shower tomorrow.   Moderate Conscious Sedation, Adult, Care After This sheet gives you information about how to care for yourself after your procedure. Your health care provider may also give you more specific instructions. If you have problems or questions, contact your health careprovider. What can I expect after the procedure? After the procedure, it is common to have: Sleepiness for several hours. Impaired judgment for several hours. Difficulty with balance. Vomiting if you eat too soon. Follow these instructions at home: For the time period you were told by your health care provider: Rest. Do not participate in activities where you could fall or become injured. Do not drive or use machinery. Do not drink alcohol. Do not take sleeping pills or medicines that cause drowsiness. Do not make important decisions or sign legal documents. Do not take care of children on your own. Eating and drinking  Follow the diet recommended by your health care provider. Drink enough fluid to keep your urine pale yellow. If you vomit: Drink water, juice, or soup when you can drink without vomiting. Make sure you have little or no nausea before eating solid foods.  General instructions Take over-the-counter and prescription medicines only as told by your health care provider. Have a responsible adult stay with you for the time you are told. It is important to have someone help care for you until you are awake and alert. Do not smoke. Keep all follow-up visits as told by your health care provider. This is important. Contact a health care provider if: You are still sleepy or having trouble with balance after 24 hours. You feel light-headed. You keep feeling nauseous or you keep vomiting. You develop a rash. You have a fever. You have redness or swelling around the IV  site. Get help right away if: You have trouble breathing. You have new-onset confusion at home. Summary After the procedure, it is common to feel sleepy, have impaired judgment, or feel nauseous if you eat too soon. Rest after you get home. Know the things you should not do after the procedure. Follow the diet recommended by your health care provider and drink enough fluid to keep your urine pale yellow. Get help right away if you have trouble breathing or new-onset confusion at home. This information is not intended to replace advice given to you by your health care provider. Make sure you discuss any questions you have with your healthcare provider. Document Revised: 07/07/2019 Document Reviewed: 02/02/2019 Elsevier Patient Education  2022 Elsevier Inc.   Bone Marrow Aspiration and Bone Marrow Biopsy, Adult, Care After This sheet gives you information about how to care for yourself after your procedure. Your health care provider may also give you more specific instructions. If you have problems or questions, contact your health careprovider. What can I expect after the procedure? After the procedure, it is common to have: Mild pain and tenderness. Swelling. Bruising. Follow these instructions at home: Puncture site care Follow instructions from your health care provider about how to take care of the puncture site. Make sure you: Wash your hands with soap and water before and after you change your bandage (dressing). If soap and water are not available, use hand sanitizer. Change your dressing as told by your health care provider. Check your puncture site every day for signs of infection. Check for: More redness, swelling, or pain. Fluid or blood. Warmth. Pus or a   bad smell.  Activity Return to your normal activities as told by your health care provider. Ask your health care provider what activities are safe for you. Do not lift anything that is heavier than 10 lb (4.5 kg), or the  limit that you are told, until your health care provider says that it is safe. Do not drive for 24 hours if you were given a sedative during your procedure. General instructions Take over-the-counter and prescription medicines only as told by your health care provider. Do not take baths, swim, or use a hot tub until your health care provider approves. Ask your health care provider if you may take showers. You may only be allowed to take sponge baths. If directed, put ice on the affected area. To do this: Put ice in a plastic bag. Place a towel between your skin and the bag. Leave the ice on for 20 minutes, 2-3 times a day. Keep all follow-up visits as told by your health care provider. This is important.  Contact a health care provider if: Your pain is not controlled with medicine. You have a fever. You have more redness, swelling, or pain around the puncture site. You have fluid or blood coming from the puncture site. Your puncture site feels warm to the touch. You have pus or a bad smell coming from the puncture site. Summary After the procedure, it is common to have mild pain, tenderness, swelling, and bruising. Follow instructions from your health care provider about how to take care of the puncture site and what activities are safe for you. Take over-the-counter and prescription medicines only as told by your health care provider. Contact a health care provider if you have any signs of infection, such as fluid or blood coming from the puncture site. This information is not intended to replace advice given to you by your health care provider. Make sure you discuss any questions you have with your healthcare provider. Document Revised: 07/26/2018 Document Reviewed: 07/26/2018 Elsevier Patient Education  [place].

## 2021-11-05 ENCOUNTER — Telehealth: Payer: Self-pay | Admitting: *Deleted

## 2021-11-05 DIAGNOSIS — R2689 Other abnormalities of gait and mobility: Secondary | ICD-10-CM | POA: Diagnosis not present

## 2021-11-05 NOTE — Telephone Encounter (Signed)
Received call from pt's daughter asking about results of her mother's bone marrow biopsy. Advised that the results are not back yet. Advised that it can take up a week for the results to come back. Advised that we will call her when the results come back. She voiced understanding.

## 2021-11-06 ENCOUNTER — Other Ambulatory Visit: Payer: Self-pay | Admitting: Hematology

## 2021-11-06 LAB — SURGICAL PATHOLOGY

## 2021-11-07 DIAGNOSIS — R2689 Other abnormalities of gait and mobility: Secondary | ICD-10-CM | POA: Diagnosis not present

## 2021-11-12 ENCOUNTER — Encounter (HOSPITAL_COMMUNITY): Payer: Self-pay

## 2021-11-12 DIAGNOSIS — R2689 Other abnormalities of gait and mobility: Secondary | ICD-10-CM | POA: Diagnosis not present

## 2021-11-17 ENCOUNTER — Other Ambulatory Visit: Payer: Self-pay | Admitting: Physician Assistant

## 2021-11-17 DIAGNOSIS — D61818 Other pancytopenia: Secondary | ICD-10-CM

## 2021-11-18 ENCOUNTER — Other Ambulatory Visit: Payer: Self-pay

## 2021-11-18 ENCOUNTER — Other Ambulatory Visit: Payer: Self-pay | Admitting: Physician Assistant

## 2021-11-18 ENCOUNTER — Inpatient Hospital Stay (HOSPITAL_BASED_OUTPATIENT_CLINIC_OR_DEPARTMENT_OTHER): Payer: Medicare HMO | Admitting: Physician Assistant

## 2021-11-18 ENCOUNTER — Inpatient Hospital Stay: Payer: Medicare HMO

## 2021-11-18 VITALS — BP 120/44 | HR 80 | Temp 97.2°F | Resp 16 | Ht 59.0 in | Wt 95.0 lb

## 2021-11-18 DIAGNOSIS — D7581 Myelofibrosis: Secondary | ICD-10-CM

## 2021-11-18 DIAGNOSIS — D61818 Other pancytopenia: Secondary | ICD-10-CM

## 2021-11-18 DIAGNOSIS — D539 Nutritional anemia, unspecified: Secondary | ICD-10-CM

## 2021-11-18 DIAGNOSIS — D696 Thrombocytopenia, unspecified: Secondary | ICD-10-CM

## 2021-11-18 DIAGNOSIS — D649 Anemia, unspecified: Secondary | ICD-10-CM

## 2021-11-18 LAB — RETIC PANEL
Immature Retic Fract: 34.5 % — ABNORMAL HIGH (ref 2.3–15.9)
RBC.: 2.68 MIL/uL — ABNORMAL LOW (ref 3.87–5.11)
Retic Count, Absolute: 62.7 10*3/uL (ref 19.0–186.0)
Retic Ct Pct: 2.3 % (ref 0.4–3.1)
Reticulocyte Hemoglobin: 36.7 pg (ref >27.9)

## 2021-11-18 LAB — CBC WITH DIFFERENTIAL (CANCER CENTER ONLY)
Abs Immature Granulocytes: 0.36 10*3/uL — ABNORMAL HIGH (ref 0.00–0.07)
Basophils Absolute: 0.1 10*3/uL (ref 0.0–0.1)
Basophils Relative: 2 %
Eosinophils Absolute: 0.1 10*3/uL (ref 0.0–0.5)
Eosinophils Relative: 2 %
HCT: 24.9 % — ABNORMAL LOW (ref 36.0–46.0)
Hemoglobin: 8.2 g/dL — ABNORMAL LOW (ref 12.0–15.0)
Immature Granulocytes: 9 %
Lymphocytes Relative: 16 %
Lymphs Abs: 0.7 10*3/uL (ref 0.7–4.0)
MCH: 30.6 pg (ref 26.0–34.0)
MCHC: 32.9 g/dL (ref 30.0–36.0)
MCV: 92.9 fL (ref 80.0–100.0)
Monocytes Absolute: 0.4 10*3/uL (ref 0.1–1.0)
Monocytes Relative: 9 %
Neutro Abs: 2.7 10*3/uL (ref 1.7–7.7)
Neutrophils Relative %: 62 %
Platelet Count: 76 10*3/uL — ABNORMAL LOW (ref 150–400)
RBC: 2.68 MIL/uL — ABNORMAL LOW (ref 3.87–5.11)
RDW: 22.9 % — ABNORMAL HIGH (ref 11.5–15.5)
WBC Count: 4.2 10*3/uL (ref 4.0–10.5)
nRBC: 0.9 % — ABNORMAL HIGH (ref 0.0–0.2)

## 2021-11-18 LAB — CMP (CANCER CENTER ONLY)
ALT: 23 U/L (ref 0–44)
AST: 22 U/L (ref 15–41)
Albumin: 4.3 g/dL (ref 3.5–5.0)
Alkaline Phosphatase: 162 U/L — ABNORMAL HIGH (ref 38–126)
Anion gap: 3 — ABNORMAL LOW (ref 5–15)
BUN: 19 mg/dL (ref 8–23)
CO2: 29 mmol/L (ref 22–32)
Calcium: 9.6 mg/dL (ref 8.9–10.3)
Chloride: 96 mmol/L — ABNORMAL LOW (ref 98–111)
Creatinine: 0.75 mg/dL (ref 0.44–1.00)
GFR, Estimated: >60 mL/min (ref >60)
Glucose, Bld: 118 mg/dL — ABNORMAL HIGH (ref 70–99)
Potassium: 4.4 mmol/L (ref 3.5–5.1)
Sodium: 128 mmol/L — ABNORMAL LOW (ref 135–145)
Total Bilirubin: 0.9 mg/dL (ref 0.3–1.2)
Total Protein: 6.3 g/dL — ABNORMAL LOW (ref 6.5–8.1)

## 2021-11-18 LAB — TYPE AND SCREEN
ABO/RH(D): A NEG
Antibody Screen: NEGATIVE

## 2021-11-19 ENCOUNTER — Encounter (HOSPITAL_COMMUNITY): Payer: Self-pay

## 2021-11-19 ENCOUNTER — Telehealth: Payer: Self-pay | Admitting: Physician Assistant

## 2021-11-19 DIAGNOSIS — R2689 Other abnormalities of gait and mobility: Secondary | ICD-10-CM | POA: Diagnosis not present

## 2021-11-19 NOTE — Telephone Encounter (Signed)
Per 8/29 los called and spoke to pt daughter about appointment

## 2021-11-20 ENCOUNTER — Telehealth: Payer: Self-pay

## 2021-11-20 ENCOUNTER — Other Ambulatory Visit: Payer: Self-pay

## 2021-11-20 DIAGNOSIS — D539 Nutritional anemia, unspecified: Secondary | ICD-10-CM

## 2021-11-20 DIAGNOSIS — D7581 Myelofibrosis: Secondary | ICD-10-CM | POA: Insufficient documentation

## 2021-11-20 NOTE — Progress Notes (Signed)
[place] [phone]   Fax:([phone] [place] NOTE  Patient Care Team: Lauree Chandler, NP as PCP - General (Geriatric Medicine) Ngetich, Nelda Bucks, NP as Nurse Practitioner (Family Medicine)  CHIEF COMPLAINTS/PURPOSE OF CONSULTATION:  Myelofibrosis  INTERVAL HISTORY:  KYRIA BUMGARDNER 88 y.o. female returns for follow-up for newly diagnosed myelofibrosis.  She is accompanied by her son in the and daughter for this visit.  Ms. Paczkowski reports her energy levels are fairly stable.  She reports having persistent fatigue but she is able to do her basic ADLs on her own.  Her appetite has been stable for the last several weeks and she denies weight loss.  She denies nausea, vomiting or abdominal pain.  Her bowel habits are unchanged without any recurrent episodes of diarrhea or constipation.  She continues to have bruising in her arms and legs which is chronic in nature.  She denies any active bleeding.She denies fevers, chills, night sweats, shortness of breath, chest pain or cough.She has no other complaints. Rest of the 10 point ROS is below.   MEDICAL HISTORY:  Past Medical History:  Diagnosis Date   Arthritis    History of melanoma    shoulder   History of squamous cell carcinoma    History of TIA (transient ischemic attack)    Hypertension    Macrocytic anemia    Mini stroke    Per [place] New Patient Packet    Other hyperlipidemia    Personal history of malignant neoplasm of other organs and systems    Seizure [facility])     SURGICAL HISTORY: Past Surgical History:  Procedure Laterality Date   CATARACT EXTRACTION     DENTAL SURGERY     SKIN SURGERY  07/2018   SQUAMOUS CELL CARCINOMA EXCISION  09/10/2021   TONSILLECTOMY  1946   Per [place] New Patient Packet     SOCIAL HISTORY: Social History   Socioeconomic History   Marital status: Widowed    Spouse name: Not on file   Number of children: Not on file   Years of education: Not on  file   Highest education level: Not on file  Occupational History   Occupation: retired  Tobacco Use   Smoking status: Former    Packs/day: 0.50    Years: 30.00    Total pack years: 15.00    Types: Cigarettes    Quit date: 09/28/1968    Years since quitting: 53.1   Smokeless tobacco: Never  Vaping Use   Vaping Use: Never used  Substance and Sexual Activity   Alcohol use: Not Currently   Drug use: Never   Sexual activity: Not on file  Other Topics Concern   Not on file  Social History Narrative   Per [place] Patient Packet 01/13/2019       Diet: No answer       Caffeine: Coffee       Married, if yes what year: Widowed, married in [place] you live in a house, apartment, assisted living, condo, trailer, ect: One stories, one person (apartment)       Pets: No      Current/Past profession: Museum/gallery conservator       Exercise:Yes, 5-6 x weekly          Living Will: Yes   DNR: Yes   POA/HPOA: Yes      Functional Status:   Do you have difficulty bathing or dressing  yourself?No   Do you have difficulty preparing food or eating? No   Do you have difficulty managing your medications? No   Do you have difficulty managing your finances? No   Do you have difficulty affording your medications? No   Social Determinants of Radio broadcast assistant Strain: Not on file  Food Insecurity: Not on file  Transportation Needs: Not on file  Physical Activity: Not on file  Stress: Not on file  Social Connections: Not on file  Intimate Partner Violence: Not on file    FAMILY HISTORY: Family History  Problem Relation Age of Onset   Dementia Mother 7   Breast cancer Mother    Pneumonia Father 29   Cancer Father    Thyroid disease Daughter    Thyroid disease Daughter    Stroke Neg Hx     ALLERGIES:  has No Known Allergies.  MEDICATIONS:  Current Outpatient Medications  Medication Sig Dispense Refill   acetaminophen (TYLENOL) 325 MG tablet Take 650 mg by mouth  every 6 (six) hours as needed for moderate pain or mild pain.     atorvastatin (LIPITOR) 80 MG tablet TAKE 1 TABLET EVERY DAY  AT  6PM (Patient taking differently: Take 80 mg by mouth daily.) 90 tablet 2   Calcium Carbonate-Vitamin D 600-400 MG-UNIT tablet Take 1 tablet by mouth 2 (two) times daily. 60 tablet 11   clopidogrel (PLAVIX) 75 MG tablet TAKE 1 TABLET EVERY DAY (Patient taking differently: Take 75 mg by mouth daily.) 90 tablet 2   feeding supplement (ENSURE ENLIVE / ENSURE PLUS) LIQD DRINK 1-CAN (237MLS) BY MOUTH AFTER LUNCH (Patient taking differently: Take 237 mLs by mouth See admin instructions. Daily after lunch) 237 mL 11   fluticasone (FLONASE) 50 MCG/ACT nasal spray Place 1 spray into both nostrils daily as needed for allergies.     levETIRAcetam (KEPPRA) 250 MG tablet Take 1 tablet (250 mg total) by mouth 2 (two) times daily. 180 tablet 3   lisinopril (ZESTRIL) 10 MG tablet Take 10 mg by mouth daily.     loperamide (IMODIUM) 2 MG capsule Take 4 mg by mouth as needed for diarrhea or loose stools.     memantine (NAMENDA) 5 MG tablet Take 1 tablet (5 mg total) by mouth 2 (two) times daily. 180 tablet 3   vitamin B-12 (CYANOCOBALAMIN) 1000 MCG tablet Take 1 tablet (1,000 mcg total) by mouth daily. 90 tablet 2   No current facility-administered medications for this visit.    REVIEW OF SYSTEMS:   Constitutional: ( - ) fevers, ( - )  chills , ( - ) night sweats Eyes: ( - ) blurriness of vision, ( - ) double vision, ( - ) watery eyes Ears, nose, mouth, throat, and face: ( - ) mucositis, ( - ) sore throat Respiratory: ( - ) cough, ( - ) dyspnea, ( - ) wheezes Cardiovascular: ( - ) palpitation, ( - ) chest discomfort, ( - ) lower extremity swelling Gastrointestinal:  ( - ) nausea, ( - ) heartburn, ( - ) change in bowel habits Skin: ( - ) abnormal skin rashes Lymphatics: ( - ) new lymphadenopathy, ( - ) easy bruising Neurological: ( - ) numbness, ( - ) tingling, ( - ) new  weaknesses Behavioral/Psych: ( - ) mood change, ( - ) new changes  All other systems were reviewed with the patient and are negative.  PHYSICAL EXAMINATION: ECOG PERFORMANCE STATUS: 2 - Symptomatic, <50% confined to bed  Vitals:  11/18/21 1303  BP: (!) 120/44  Pulse: 80  Resp: 16  Temp: (!) 97.2 F (36.2 C)  SpO2: 100%   Filed Weights   11/18/21 1303  Weight: 95 lb (43.1 kg)    GENERAL: well appearing female in NAD  SKIN: skin color, texture, turgor are normal, no rashes or significant lesions EYES: conjunctiva are pink and non-injected, sclera clear LUNGS: clear to auscultation and percussion with normal breathing effort HEART: regular rate & rhythm and no murmurs and no lower extremity edema Musculoskeletal: no cyanosis of digits and no clubbing  PSYCH: alert & oriented x 3, fluent speech NEURO: no focal motor/sensory deficits  LABORATORY DATA:  I have reviewed the data as listed    Latest Ref Rng & Units 11/18/2021   12:29 PM 11/03/2021    8:54 AM 10/24/2021    7:45 AM  CBC  WBC 4.0 - 10.5 K/uL 4.2  4.4  4.4   Hemoglobin 12.0 - 15.0 g/dL 8.2  10.0  8.0   Hematocrit 36.0 - 46.0 % 24.9  31.4  24.7   Platelets 150 - 400 K/uL 76  53  58        Latest Ref Rng & Units 11/18/2021   12:29 PM 10/20/2021   12:29 PM 10/08/2021   12:38 PM  CMP  Glucose 70 - 99 mg/dL 118  107  106   BUN 8 - 23 mg/dL 19  21  23    Creatinine 0.44 - 1.00 mg/dL 0.75  0.98  0.85   Sodium 135 - 145 mmol/L 128  132  132   Potassium 3.5 - 5.1 mmol/L 4.4  4.6  4.6   Chloride 98 - 111 mmol/L 96  99  101   CO2 22 - 32 mmol/L 29  25  29    Calcium 8.9 - 10.3 mg/dL 9.6  9.3  9.5   Total Protein 6.5 - 8.1 g/dL 6.3   6.0   Total Bilirubin 0.3 - 1.2 mg/dL 0.9   0.7   Alkaline Phos 38 - 126 U/L 162   145   AST 15 - 41 U/L 22   20   ALT 0 - 44 U/L 23   18      RADIOGRAPHIC STUDIES: I have personally reviewed the radiological images as listed and agreed with the findings in the report. CT BONE MARROW  BIOPSY & ASPIRATION  Result Date: 11/03/2021 INDICATION: Thrombocytopenia EXAM: CT GUIDED BONE MARROW ASPIRATION AND CORE BIOPSY MEDICATIONS: None. ANESTHESIA/SEDATION: Moderate (conscious) sedation was employed during this procedure. A total of Versed 0.25 mg and Fentanyl 25 mcg was administered intravenously. Moderate Sedation Time: 20 minutes. The patient's level of consciousness and vital signs were monitored continuously by radiology nursing throughout the procedure under my direct supervision. FLUOROSCOPY TIME:  CT dose in mGy was not provided. COMPLICATIONS: None immediate. Estimated blood loss: <5 mL PROCEDURE: RADIATION DOSE REDUCTION: This exam was performed according to the departmental dose-optimization program which includes automated exposure control, adjustment of the mA and/or kV according to patient size and/or use of iterative reconstruction technique. Informed written consent was obtained from the the patient and/or patient's representative after a thorough discussion of the procedural risks, benefits and alternatives. All questions were addressed. Maximal Sterile Barrier Technique was utilized including caps, mask, sterile gowns, sterile gloves, sterile drape, hand hygiene and skin antiseptic. A timeout was performed prior to the initiation of the procedure. The patient was positioned prone and non-contrast localization CT was performed of the pelvis to  demonstrate the iliac marrow spaces. Maximal barrier sterile technique utilized including caps, mask, sterile gowns, sterile gloves, large sterile drape, hand hygiene, and chlorhexidine prep. Under sterile conditions and local anesthesia, an 11 gauge coaxial bone biopsy needle was advanced into the RIGHT iliac marrow space. Needle position was confirmed with CT imaging. Initially, bone marrow aspiration was performed. Next, the 11 gauge outer cannula was utilized to obtain a 2 iliac bone marrow core biopsy. Needle was removed. Hemostasis was  obtained with compression. The patient tolerated the procedure well. Samples were prepared with the cytotechnologist. IMPRESSION: Successful CT-guided bone marrow aspiration and biopsy, as above. Michaelle Birks, MD [facility] Radiology Electronically Signed   By: Michaelle Birks M.D.   On: 11/03/2021 10:51   CT CHEST ABDOMEN PELVIS W CONTRAST  Result Date: 10/22/2021 CLINICAL DATA:  Evaluate for metastatic disease * Tracking Code: BO * EXAM: CT CHEST, ABDOMEN, AND PELVIS WITH CONTRAST TECHNIQUE: Multidetector CT imaging of the chest, abdomen and pelvis was performed following the standard protocol during bolus administration of intravenous contrast. RADIATION DOSE REDUCTION: This exam was performed according to the departmental dose-optimization program which includes automated exposure control, adjustment of the mA and/or kV according to patient size and/or use of iterative reconstruction technique. CONTRAST:  52m OMNIPAQUE IOHEXOL 300 MG/ML SOLN additional oral enteric contrast COMPARISON:  CT brain and cervical spine, 10/20/2021 FINDINGS: CT CHEST FINDINGS Cardiovascular: Aortic atherosclerosis. Normal heart size. Left right coronary artery calcifications. Dense mitral valve calcifications. No pericardial effusion. Mediastinum/Nodes: No enlarged mediastinal, hilar, or axillary lymph nodes. Benign, densely calcified left hilar lymph nodes (series 2, image 25). Thyroid gland, trachea, and esophagus demonstrate no significant findings. Lungs/Pleura: Benign calcified nodule of the anterior left upper lobe. No pleural effusion or pneumothorax. Musculoskeletal: No chest wall mass. CT ABDOMEN PELVIS FINDINGS Hepatobiliary: No solid liver abnormality is seen. No gallstones, gallbladder wall thickening, or biliary dilatation. Pancreas: Unremarkable. No pancreatic ductal dilatation or surrounding inflammatory changes. Spleen: Normal in size without significant abnormality.  Adrenals/Urinary Tract: Adrenal glands are unremarkable. Kidneys are normal, without renal calculi, solid lesion, or hydronephrosis. Bladder is unremarkable. Stomach/Bowel: Stomach is within normal limits. Appendix appears normal. No evidence of bowel wall thickening, distention, or inflammatory changes. Vascular/Lymphatic: Aortic atherosclerosis. No enlarged abdominal or pelvic lymph nodes. Reproductive: No mass or other abnormality. Other: No abdominal wall hernia or abnormality. No ascites. Musculoskeletal: Diffusely sclerotic and heterogeneous osseous structures throughout. IMPRESSION: 1. Diffusely sclerotic and heterogeneous osseous structures throughout, most consistent with multiple myeloma or perhaps diffuse osseous metastatic disease, however without candidate primary lesion identified in the chest, abdomen, or pelvis. Renal osteodystrophy is in general a benign differential consideration for this appearance if there is known chronic end-stage renal disease 2. No other evidence of primary malignancy or metastatic disease in the chest, abdomen, or pelvis. 3. Coronary artery disease. Aortic Atherosclerosis (ICD10-I70.0). Electronically Signed   By: ADelanna AhmadiM.D.   On: 10/22/2021 18:11    ASSESSMENT & PLAN PKALLAN BISCHOFFis a 88y.o. female for newly diagnosed myelofibrosis.   #Myelofibrosis-intermediate risk per IPSS Risk Calculation : --Bone marrow biopsy from 11/03/2021 revealed significantly hypocellular bone marrow with myelofibrosis (grade 2 of 3) and essentially absent myeloid precursors with rare erythroid colonies and increased dysplastic appearing megakaryocytes.  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  Molecular analysis of the bone marrow detected a low level of JAK2 V617F mutation. --CT CAP from 10/22/2021 showed diffusely sclerotic and heterogeneous osseous structures Throughout. No  splenomegaly.  --Labs today showed worsening anemia with Hgb 8.2. Plt oscillate  between 50-70K, today it is 76K. Awaiting results from MPN panel ordered today.  --Discussed interventions including Jakafi versus supportive care with transfusion support. Due to baseline cytopenias, patient's age and overall performance status, recommend supportive care with transfusion support.  --Patient will return next week for labs and slot for 1 unit of PRBC.  --Parameters for transfusion including Hgb <8 or Plts <20.  --Strict precautions given for bleeding. --We will plan to check CBC with differential every other week at her residential facility, [facility], to determine if she needs to come for transfusions.  --RTC in 6 weeks with labs and f/u visit with Dr. Lorenso Courier   No orders of the defined types were placed in this encounter.   All questions were answered. The patient knows to call the clinic with any problems, questions or concerns.  I have spent a total of 30 minutes minutes of face-to-face and non-face-to-face time, preparing to see the patient, obtaining and/or reviewing separately obtained history, performing a medically appropriate examination, counseling and educating the patient, ordering tests/procedures, documenting clinical information in the electronic health record, and care coordination.   Dede Query, PA-C Department of Hematology/Oncology [place] at [facility] Phone: [phone]  Patient was seen with Dr. Lorenso Courier  I have read the above note and personally examined the patient. I agree with the assessment and plan as noted above.  Briefly Ms. Tiarna Koppen is an 88 year old female who presents for discussion regarding the results of her bone marrow biopsy and new diagnosis of primary myelofibrosis.  We noted that there was grade 2 of 3 fibrosis which was the likely cause of her anemia and thrombocytopenia.  We discussed the incurable nature of this disease and best supportive care moving forward.  There are no disease altering  agents available, though there are medications which can help with the symptoms of myelofibrosis such as splenomegaly.  The patient voiced understanding of the plan moving forward.   Ledell Peoples, MD Department of Hematology/Oncology [place] at [facility] Phone: [phone] Pager: [phone] Email: [email or web address]

## 2021-11-20 NOTE — Telephone Encounter (Signed)
T/C from [place]  [phone]) at the [facility] asking for lab dates so he will be prepared to be staffed for pt.  Advised she will not need labs until after her 9/7 appt.  At that time we can fax him her schedule to [phone]

## 2021-11-21 ENCOUNTER — Other Ambulatory Visit: Payer: Self-pay

## 2021-11-21 DIAGNOSIS — D539 Nutritional anemia, unspecified: Secondary | ICD-10-CM

## 2021-11-21 DIAGNOSIS — R2689 Other abnormalities of gait and mobility: Secondary | ICD-10-CM | POA: Diagnosis not present

## 2021-11-21 NOTE — Telephone Encounter (Signed)
Order faxed and confirmation received.

## 2021-11-25 LAB — JAK2 (INCLUDING V617F AND EXON 12), MPL,& CALR W/RFL MPN PANEL (NGS)

## 2021-11-26 DIAGNOSIS — R2689 Other abnormalities of gait and mobility: Secondary | ICD-10-CM | POA: Diagnosis not present

## 2021-11-27 ENCOUNTER — Emergency Department (HOSPITAL_COMMUNITY): Payer: Medicare HMO

## 2021-11-27 ENCOUNTER — Other Ambulatory Visit: Payer: Self-pay

## 2021-11-27 ENCOUNTER — Inpatient Hospital Stay: Payer: Medicare HMO

## 2021-11-27 ENCOUNTER — Observation Stay (HOSPITAL_COMMUNITY)
Admission: EM | Admit: 2021-11-27 | Discharge: 2021-11-30 | Disposition: A | Discharge status: Home Health | Payer: Medicare HMO | Attending: Internal Medicine | Admitting: Internal Medicine

## 2021-11-27 ENCOUNTER — Encounter (HOSPITAL_COMMUNITY): Payer: Self-pay

## 2021-11-27 DIAGNOSIS — R778 Other specified abnormalities of plasma proteins: Secondary | ICD-10-CM | POA: Diagnosis not present

## 2021-11-27 DIAGNOSIS — R338 Other retention of urine: Secondary | ICD-10-CM

## 2021-11-27 DIAGNOSIS — Z87891 Personal history of nicotine dependence: Secondary | ICD-10-CM | POA: Diagnosis not present

## 2021-11-27 DIAGNOSIS — D7581 Myelofibrosis: Secondary | ICD-10-CM | POA: Insufficient documentation

## 2021-11-27 DIAGNOSIS — I10 Essential (primary) hypertension: Secondary | ICD-10-CM | POA: Diagnosis not present

## 2021-11-27 DIAGNOSIS — R112 Nausea with vomiting, unspecified: Principal | ICD-10-CM

## 2021-11-27 DIAGNOSIS — Z87898 Personal history of other specified conditions: Secondary | ICD-10-CM | POA: Diagnosis not present

## 2021-11-27 DIAGNOSIS — Z79899 Other long term (current) drug therapy: Secondary | ICD-10-CM | POA: Diagnosis not present

## 2021-11-27 DIAGNOSIS — E871 Hypo-osmolality and hyponatremia: Secondary | ICD-10-CM | POA: Diagnosis not present

## 2021-11-27 DIAGNOSIS — R2689 Other abnormalities of gait and mobility: Secondary | ICD-10-CM | POA: Diagnosis not present

## 2021-11-27 DIAGNOSIS — Z7902 Long term (current) use of antithrombotics/antiplatelets: Secondary | ICD-10-CM | POA: Diagnosis not present

## 2021-11-27 DIAGNOSIS — I959 Hypotension, unspecified: Secondary | ICD-10-CM | POA: Diagnosis not present

## 2021-11-27 DIAGNOSIS — R2681 Unsteadiness on feet: Secondary | ICD-10-CM | POA: Diagnosis not present

## 2021-11-27 DIAGNOSIS — Z85828 Personal history of other malignant neoplasm of skin: Secondary | ICD-10-CM | POA: Insufficient documentation

## 2021-11-27 DIAGNOSIS — D61818 Other pancytopenia: Secondary | ICD-10-CM | POA: Diagnosis present

## 2021-11-27 DIAGNOSIS — E7849 Other hyperlipidemia: Secondary | ICD-10-CM | POA: Diagnosis not present

## 2021-11-27 DIAGNOSIS — R197 Diarrhea, unspecified: Secondary | ICD-10-CM | POA: Diagnosis not present

## 2021-11-27 DIAGNOSIS — N1330 Unspecified hydronephrosis: Secondary | ICD-10-CM | POA: Diagnosis not present

## 2021-11-27 DIAGNOSIS — M6281 Muscle weakness (generalized): Secondary | ICD-10-CM | POA: Diagnosis not present

## 2021-11-27 DIAGNOSIS — K2970 Gastritis, unspecified, without bleeding: Secondary | ICD-10-CM | POA: Diagnosis not present

## 2021-11-27 DIAGNOSIS — Z85820 Personal history of malignant melanoma of skin: Secondary | ICD-10-CM | POA: Insufficient documentation

## 2021-11-27 DIAGNOSIS — R051 Acute cough: Secondary | ICD-10-CM

## 2021-11-27 DIAGNOSIS — R339 Retention of urine, unspecified: Secondary | ICD-10-CM | POA: Diagnosis not present

## 2021-11-27 DIAGNOSIS — Z20822 Contact with and (suspected) exposure to covid-19: Secondary | ICD-10-CM | POA: Insufficient documentation

## 2021-11-27 DIAGNOSIS — Z8673 Personal history of transient ischemic attack (TIA), and cerebral infarction without residual deficits: Secondary | ICD-10-CM

## 2021-11-27 DIAGNOSIS — R109 Unspecified abdominal pain: Secondary | ICD-10-CM | POA: Diagnosis not present

## 2021-11-27 DIAGNOSIS — E860 Dehydration: Secondary | ICD-10-CM

## 2021-11-27 DIAGNOSIS — R059 Cough, unspecified: Secondary | ICD-10-CM | POA: Diagnosis not present

## 2021-11-27 DIAGNOSIS — E875 Hyperkalemia: Secondary | ICD-10-CM | POA: Diagnosis not present

## 2021-11-27 DIAGNOSIS — R1111 Vomiting without nausea: Secondary | ICD-10-CM | POA: Diagnosis not present

## 2021-11-27 DIAGNOSIS — R7989 Other specified abnormal findings of blood chemistry: Secondary | ICD-10-CM

## 2021-11-27 LAB — URINALYSIS, ROUTINE W REFLEX MICROSCOPIC
Bilirubin Urine: NEGATIVE
Glucose, UA: NEGATIVE mg/dL
Hgb urine dipstick: NEGATIVE
Ketones, ur: NEGATIVE mg/dL
Leukocytes,Ua: NEGATIVE
Nitrite: NEGATIVE
Protein, ur: NEGATIVE mg/dL
Specific Gravity, Urine: 1.014 (ref 1.005–1.030)
pH: 6 (ref 5.0–8.0)

## 2021-11-27 LAB — TROPONIN I (HIGH SENSITIVITY)
Troponin I (High Sensitivity): 22 ng/L — ABNORMAL HIGH (ref <18)
Troponin I (High Sensitivity): 26 ng/L — ABNORMAL HIGH (ref <18)

## 2021-11-27 LAB — COMPREHENSIVE METABOLIC PANEL
ALT: 19 U/L (ref 0–44)
AST: 25 U/L (ref 15–41)
Albumin: 4.1 g/dL (ref 3.5–5.0)
Alkaline Phosphatase: 139 U/L — ABNORMAL HIGH (ref 38–126)
Anion gap: 10 (ref 5–15)
BUN: 37 mg/dL — ABNORMAL HIGH (ref 8–23)
CO2: 21 mmol/L — ABNORMAL LOW (ref 22–32)
Calcium: 9.1 mg/dL (ref 8.9–10.3)
Chloride: 101 mmol/L (ref 98–111)
Creatinine, Ser: 1.07 mg/dL — ABNORMAL HIGH (ref 0.44–1.00)
GFR, Estimated: 50 mL/min — ABNORMAL LOW (ref >60)
Glucose, Bld: 109 mg/dL — ABNORMAL HIGH (ref 70–99)
Potassium: 5.2 mmol/L — ABNORMAL HIGH (ref 3.5–5.1)
Sodium: 132 mmol/L — ABNORMAL LOW (ref 135–145)
Total Bilirubin: 1 mg/dL (ref 0.3–1.2)
Total Protein: 6.7 g/dL (ref 6.5–8.1)

## 2021-11-27 LAB — HEMOGLOBIN AND HEMATOCRIT, BLOOD
HCT: 23.8 % — ABNORMAL LOW (ref 36.0–46.0)
Hemoglobin: 7.6 g/dL — ABNORMAL LOW (ref 12.0–15.0)

## 2021-11-27 LAB — PREPARE RBC (CROSSMATCH)

## 2021-11-27 LAB — CBC
HCT: 23.5 % — ABNORMAL LOW (ref 36.0–46.0)
Hemoglobin: 7.4 g/dL — ABNORMAL LOW (ref 12.0–15.0)
MCH: 31.4 pg (ref 26.0–34.0)
MCHC: 31.5 g/dL (ref 30.0–36.0)
MCV: 99.6 fL (ref 80.0–100.0)
Platelets: 63 10*3/uL — ABNORMAL LOW (ref 150–400)
RBC: 2.36 MIL/uL — ABNORMAL LOW (ref 3.87–5.11)
RDW: 24.2 % — ABNORMAL HIGH (ref 11.5–15.5)
WBC: 3.1 10*3/uL — ABNORMAL LOW (ref 4.0–10.5)
nRBC: 1 % — ABNORMAL HIGH (ref 0.0–0.2)

## 2021-11-27 LAB — SARS CORONAVIRUS 2 BY RT PCR: SARS Coronavirus 2 by RT PCR: NEGATIVE

## 2021-11-27 MED ORDER — LACTATED RINGERS IV SOLN: INTRAVENOUS | Status: AC

## 2021-11-27 MED ORDER — ACETAMINOPHEN 650 MG RE SUPP: 650.0000 mg | Freq: Three times a day (TID) | RECTAL | Status: DC | PRN

## 2021-11-27 MED ORDER — SODIUM CHLORIDE 0.9 % IV SOLN: 2.0000 g | INTRAVENOUS | Status: DC

## 2021-11-27 MED ORDER — ONDANSETRON HCL 4 MG/2ML IJ SOLN: 4.0000 mg | Freq: Four times a day (QID) | INTRAMUSCULAR | Status: DC | PRN

## 2021-11-27 MED ORDER — LEVETIRACETAM 250 MG PO TABS
250.0000 mg | ORAL_TABLET | Freq: Two times a day (BID) | ORAL | Status: DC
  Administered 2021-11-27 – 2021-11-30 (×6): 250 mg via ORAL
  Filled 2021-11-27 (×6): qty 1

## 2021-11-27 MED ORDER — CLOPIDOGREL BISULFATE 75 MG PO TABS
75.0000 mg | ORAL_TABLET | Freq: Every day | ORAL | Status: DC
  Administered 2021-11-27 – 2021-11-28 (×2): 75 mg via ORAL
  Filled 2021-11-27 (×3): qty 1

## 2021-11-27 MED ORDER — LACTATED RINGERS IV BOLUS
500.0000 mL | Freq: Once | INTRAVENOUS | Status: AC
  Administered 2021-11-27: 500 mL via INTRAVENOUS

## 2021-11-27 MED ORDER — LACTATED RINGERS IV BOLUS
250.0000 mL | Freq: Once | INTRAVENOUS | Status: AC
  Administered 2021-11-27: 250 mL via INTRAVENOUS

## 2021-11-27 MED ORDER — PANTOPRAZOLE SODIUM 40 MG IV SOLR
40.0000 mg | Freq: Two times a day (BID) | INTRAVENOUS | Status: DC
Start: 2021-11-27 — End: 2021-11-27
  Administered 2021-11-27: 40 mg via INTRAVENOUS
  Filled 2021-11-27: qty 10

## 2021-11-27 MED ORDER — SODIUM CHLORIDE 0.9 % IV SOLN
2.0000 g | Freq: Once | INTRAVENOUS | Status: AC
  Administered 2021-11-27: 2 g via INTRAVENOUS
  Filled 2021-11-27: qty 12.5

## 2021-11-27 MED ORDER — OYSTER SHELL CALCIUM/D3 500-5 MG-MCG PO TABS
1.0000 | ORAL_TABLET | Freq: Two times a day (BID) | ORAL | Status: DC
  Administered 2021-11-27 – 2021-11-30 (×6): 1 via ORAL
  Filled 2021-11-27 (×6): qty 1

## 2021-11-27 MED ORDER — SODIUM CHLORIDE (PF) 0.9 % IJ SOLN
INTRAMUSCULAR | Status: AC
  Filled 2021-11-27: qty 50

## 2021-11-27 MED ORDER — SODIUM CHLORIDE 0.9 % IV SOLN: INTRAVENOUS | Status: DC

## 2021-11-27 MED ORDER — PANTOPRAZOLE SODIUM 40 MG PO TBEC: 40.0000 mg | DELAYED_RELEASE_TABLET | Freq: Two times a day (BID) | ORAL | Status: DC

## 2021-11-27 MED ORDER — SODIUM CHLORIDE 0.9% IV SOLUTION: Freq: Once | INTRAVENOUS | Status: DC

## 2021-11-27 MED ORDER — PANTOPRAZOLE SODIUM 20 MG PO TBEC
20.0000 mg | DELAYED_RELEASE_TABLET | Freq: Two times a day (BID) | ORAL | Status: DC
  Administered 2021-11-27 – 2021-11-30 (×6): 20 mg via ORAL
  Filled 2021-11-27 (×6): qty 1

## 2021-11-27 MED ORDER — ACETAMINOPHEN 500 MG PO TABS
500.0000 mg | ORAL_TABLET | Freq: Four times a day (QID) | ORAL | Status: DC | PRN
  Administered 2021-11-27 – 2021-11-29 (×6): 500 mg via ORAL
  Filled 2021-11-27 (×6): qty 1

## 2021-11-27 MED ORDER — ONDANSETRON HCL 4 MG/2ML IJ SOLN
4.0000 mg | Freq: Once | INTRAMUSCULAR | Status: AC
  Administered 2021-11-27: 4 mg via INTRAVENOUS
  Filled 2021-11-27: qty 2

## 2021-11-27 MED ORDER — IOHEXOL 300 MG/ML  SOLN
80.0000 mL | Freq: Once | INTRAMUSCULAR | Status: AC | PRN
  Administered 2021-11-27: 80 mL via INTRAVENOUS

## 2021-11-27 MED ORDER — METRONIDAZOLE 500 MG/100ML IV SOLN
500.0000 mg | Freq: Two times a day (BID) | INTRAVENOUS | Status: DC
  Administered 2021-11-28 – 2021-11-29 (×2): 500 mg via INTRAVENOUS
  Filled 2021-11-27 (×2): qty 100

## 2021-11-27 MED ORDER — ENSURE ENLIVE PO LIQD
237.0000 mL | Freq: Every day | ORAL | Status: DC
Start: 2021-11-28 — End: 2021-11-30

## 2021-11-27 MED ORDER — ATORVASTATIN CALCIUM 40 MG PO TABS
80.0000 mg | ORAL_TABLET | Freq: Every day | ORAL | Status: DC
  Administered 2021-11-27 – 2021-11-30 (×4): 80 mg via ORAL
  Filled 2021-11-27 (×4): qty 2

## 2021-11-27 MED ORDER — METRONIDAZOLE 500 MG/100ML IV SOLN: 500.0000 mg | Freq: Two times a day (BID) | INTRAVENOUS | Status: DC

## 2021-11-27 MED ORDER — SODIUM CHLORIDE 0.9 % IV BOLUS
500.0000 mL | Freq: Once | INTRAVENOUS | Status: AC
  Administered 2021-11-27: 500 mL via INTRAVENOUS

## 2021-11-27 MED ORDER — MIDODRINE HCL 5 MG PO TABS
5.0000 mg | ORAL_TABLET | Freq: Three times a day (TID) | ORAL | Status: DC
  Administered 2021-11-27 – 2021-11-30 (×9): 5 mg via ORAL
  Filled 2021-11-27 (×9): qty 1

## 2021-11-27 MED ORDER — ONDANSETRON HCL 4 MG PO TABS: 4.0000 mg | ORAL_TABLET | Freq: Four times a day (QID) | ORAL | Status: DC | PRN

## 2021-11-27 MED ORDER — SODIUM CHLORIDE 0.9 % IV SOLN
2.0000 g | INTRAVENOUS | Status: DC
  Administered 2021-11-28: 2 g via INTRAVENOUS
  Filled 2021-11-27: qty 20

## 2021-11-27 MED ORDER — LISINOPRIL 10 MG PO TABS: 10.0000 mg | ORAL_TABLET | Freq: Every day | ORAL | Status: DC

## 2021-11-27 MED ORDER — MEMANTINE HCL 10 MG PO TABS
5.0000 mg | ORAL_TABLET | Freq: Two times a day (BID) | ORAL | Status: DC
  Administered 2021-11-27 – 2021-11-30 (×7): 5 mg via ORAL
  Filled 2021-11-27 (×7): qty 1

## 2021-11-27 NOTE — Progress Notes (Signed)
[place] admitting physician addendum:  The nursing staff reported that the patient's blood pressure continues to be low despite earlier bolus. Her most recent reading was 89/35 mmHg.  She is afebrile and the rest of the vital signs are normal.  She is satting 98% on room air.  However, she continues to have left lower quadrant abdominal pain.  Given hypotension despite fluids and 5 days of symptoms, I will cover empirically with ceftriaxone and metronidazole.  We will follow blood culture and sensitivity.  Another 250 mL of LR bolus and midodrine 5 mg p.o. 3 times daily ordered.  Tennis Must, MD

## 2021-11-27 NOTE — ED Notes (Signed)
Pt given chicken soup & saltine crackers

## 2021-11-27 NOTE — H&P (Signed)
History and Physical    Patient: Sarah Hampton YYT:[id number] DOB: Feb 27, 1931 DOA: 11/27/2021 DOS: the patient was seen and examined on 11/27/2021 PCP: Lauree Chandler, NP  Patient coming from: SNF  Chief Complaint:  Chief Complaint  Patient presents with   Emesis   HPI: Sarah Hampton is a 88 y.o. female with medical history significant of osteoarthritis, shoulder melanoma, squamous cell carcinoma, TIA, hypertension, macrocytic anemia, hyperlipidemia, seizure disorder who was brought from her nursing facility due to several episodes of nausea/emesis an hour prior to arrival associated with malaise and several episodes of diarrhea over the past weekend.  She gets frequent indigestion and heartburn.  No melena or hematochezia.    No flank pain, dysuria, frequency or hematuria. No fever, chills or night sweats. No sore throat, rhinorrhea, dyspnea, wheezing or hemoptysis.  No chest pain, palpitations, diaphoresis, PND, orthopnea or pitting edema of the lower extremities. No polyuria, polydipsia, polyphagia or blurred vision.  ED course: Initial vital signs were temperature 99.8 F, pulse 77, respiration 18, BP 144/85 mmHg O2 sat 95% on room air.  The patient received cefepime 2 g IVPB for possible pneumonia coverage, ondansetron 4 mg IVP and 500 mL of normal saline bolus.  Lab work: Her urinalysis was normal.  Troponin was 22 and 26 ng/L.  CBC*white count 3.1, hemoglobin 7.4 g/dL platelets 63.  CMP sodium 132, potassium 5.2, chloride 101 and CO2 21 mmol/L.  Glucose 109, BUN 37 creatinine 1.07 mg/dL.  9 days ago her creatinine level was 0.75 mg/dL.  LFTs were normal aside from mildly elevated alk phos at 139 units/L.  Imaging: Portable 1 view chest radiograph with normal heart size and mediastinal contours.  Generalized osteopenia and interstitial coarsening.  Recent bone marrow biopsy.  There was no acute finding.  CT abdomen/pelvis with contrast showed thickened folds in the stomach due to  probable gastritis.  There was bilateral lower lobe small airways disease with scattered small bronchiolar impactions.  No bronchopneumonia.  There were linear atelectatic foci.  Constipation without bowel obstruction.  Intrahepatic periportal edema with questionable for fluid overload.  There was interval decreased thickness of the body fat layer.  Distended bladder with mild hydronephrosis with no obstructing stone.   Review of Systems: As mentioned in the history of present illness. All other systems reviewed and are negative. Past Medical History:  Diagnosis Date   Arthritis    History of melanoma    shoulder   History of squamous cell carcinoma    History of TIA (transient ischemic attack)    Hypertension    Macrocytic anemia    Mini stroke    Per [facility] Packet    Other hyperlipidemia    Personal history of malignant neoplasm of other organs and systems    Seizure [facility])    Past Surgical History:  Procedure Laterality Date   CATARACT EXTRACTION     DENTAL SURGERY     SKIN SURGERY  07/2018   SQUAMOUS CELL CARCINOMA EXCISION  09/10/2021   TONSILLECTOMY  1946   Per [facility] Patient Packet    Social History:  reports that she quit smoking about 53 years ago. Her smoking use included cigarettes. She has a 15.00 pack-year smoking history. She has never used smokeless tobacco. She reports that she does not currently use alcohol. She reports that she does not use drugs.  No Known Allergies  Family History  Problem Relation Age of Onset   Dementia Mother 51   Breast  cancer Mother    Pneumonia Father 93   Cancer Father    Thyroid disease Daughter    Thyroid disease Daughter    Stroke Neg Hx     Prior to Admission medications   Medication Sig Start Date End Date Taking? Authorizing Provider  acetaminophen (TYLENOL) 325 MG tablet Take 650 mg by mouth every 6 (six) hours as needed for moderate pain or mild pain.    [provider]  atorvastatin (LIPITOR) 80 MG  tablet TAKE 1 TABLET EVERY DAY  AT  6PM Patient taking differently: Take 80 mg by mouth daily. 05/08/21   Eubanks, Jessica K, NP  Calcium Carbonate-Vitamin D 600-400 MG-UNIT tablet Take 1 tablet by mouth 2 (two) times daily. 09/20/20   Eubanks, Jessica K, NP  clopidogrel (PLAVIX) 75 MG tablet TAKE 1 TABLET EVERY DAY Patient taking differently: Take 75 mg by mouth daily. 05/08/21   Eubanks, Jessica K, NP  feeding supplement (ENSURE ENLIVE / ENSURE PLUS) LIQD DRINK 1-CAN (237MLS) BY MOUTH AFTER LUNCH Patient taking differently: Take 237 mLs by mouth See admin instructions. Daily after lunch 09/30/21   Eubanks, Jessica K, NP  fluticasone (FLONASE) 50 MCG/ACT nasal spray Place 1 spray into both nostrils daily as needed for allergies.    [provider]  levETIRAcetam (KEPPRA) 250 MG tablet Take 1 tablet (250 mg total) by mouth 2 (two) times daily. 03/25/21 03/23/48  Camara, Amadou, MD  lisinopril (ZESTRIL) 10 MG tablet Take 10 mg by mouth daily. 09/29/21   [provider]  loperamide (IMODIUM) 2 MG capsule Take 4 mg by mouth as needed for diarrhea or loose stools.    [provider]  memantine (NAMENDA) 5 MG tablet Take 1 tablet (5 mg total) by mouth 2 (two) times daily. 03/25/21 03/23/48  Camara, Amadou, MD  vitamin B-12 (CYANOCOBALAMIN) 1000 MCG tablet Take 1 tablet (1,000 mcg total) by mouth daily. 12/26/20   Eubanks, Jessica K, NP    Physical Exam: Vitals:   11/27/21 0500 11/27/21 0600 11/27/21 0800 11/27/21 0816  BP: (!) 128/41 (!) 134/45 (!) 104/37   Pulse: 92 (!) 50 94   Resp: 20 (!) 27 19   Temp:    98.8 F (37.1 C)  TempSrc:    Oral  SpO2: 98% (!) 84% 98%   Weight:      Height:       Physical Exam Vitals and nursing note reviewed.  Constitutional:      General: She is awake.     Appearance: Normal appearance. She is not ill-appearing.  HENT:     Head: Normocephalic.     Nose: No rhinorrhea.     Mouth/Throat:     Mouth: Mucous membranes are dry.  Eyes:      General: No scleral icterus.    Pupils: Pupils are equal, round, and reactive to light.  Neck:     Vascular: No JVD.  Cardiovascular:     Rate and Rhythm: Normal rate and regular rhythm.     Heart sounds: S1 normal and S2 normal.  Pulmonary:     Effort: Pulmonary effort is normal.     Breath sounds: Normal breath sounds. No wheezing, rhonchi or rales.  Abdominal:     General: Bowel sounds are normal. There is no distension.     Palpations: Abdomen is soft.     Tenderness: There is abdominal tenderness in the left lower quadrant. There is no guarding or rebound.     Comments: Mild LLQ tenderness.    Musculoskeletal:     Cervical back: Neck supple.     Right lower leg: No edema.     Left lower leg: No edema.  Skin:    General: Skin is warm and dry.  Neurological:     General: No focal deficit present.     Mental Status: She is alert. Mental status is at baseline.  Psychiatric:        Mood and Affect: Mood normal.        Behavior: Behavior normal. Behavior is cooperative.   Data Reviewed:  There are no new results to review at this time.  Assessment and Plan: Principal Problem:   Nausea & vomiting Observation/telemetry monitoring. Clear liquid diet. Continue IV fluids. Antiemetics as needed. Pantoprazole 40 mg IVP x1. Defer further antibiotic use. Follow-up CBC and CMP in AM.  Active Problems:   Hyponatremia Secondary to GI losses. Gentle IV hydration.    Hyperkalemia Hold lisinopril. Gentle IV hydration. Follow potassium level.    Pancytopenia (HCC) Transfuse below 8.0 g/d per hem-onc. We will transfuse 1 unit of PRBC.    Gastritis Pantoprazole 40 mg IVP x1. Then pantoprazole 20 mg p.o. twice daily. Evaluation with gastroenterology as an outpatient.    Essential hypertension BP has been soft. Hold lisinopril. Monitor BP, renal function electrolytes.    Other hyperlipidemia Continue atorvastatin 80 mg p.o. daily.    History of seizure Continue Keppra  250 mg p.o. twice daily.     Advance Care Planning:   Code Status: DNR   Consults:   Family Communication:   Severity of Illness: The appropriate patient status for this patient is OBSERVATION. Observation status is judged to be reasonable and necessary in order to provide the required intensity of service to ensure the patient's safety. The patient's presenting symptoms, physical exam findings, and initial radiographic and laboratory data in the context of their medical condition is felt to place them at decreased risk for further clinical deterioration. Furthermore, it is anticipated that the patient will be medically stable for discharge from the hospital within 2 midnights of admission.   Author:  Manuel , MD 11/27/2021 8:31 AM  For on call review [email or web address].   This document was prepared using Dragon voice recognition software and may contain some unintended transcription errors.  

## 2021-11-27 NOTE — ED Triage Notes (Signed)
Pt BIBA from [facility] for vomiting x1 hour and feeling generally unwell. EMS reports pt was somewhat unsteady on feet and usually is not. A&Ox4. Not having pain any more. Supposed to get platelet infusion today.  120/48 99.5 temp Cbg 138

## 2021-11-27 NOTE — ED Triage Notes (Signed)
Reports cough the last few days, somewhat decreased appetite, and possible diarrhea. Denies urinary sx. No known sick contacts

## 2021-11-27 NOTE — ED Provider Notes (Signed)
[facility] DEPT Provider Note   CSN: [id number] Arrival date & time: 11/27/21  0400     History  Chief Complaint  Patient presents with   Emesis    Sarah Hampton is a 88 y.o. female.  The history is provided by the patient.  Emesis Severity:  Moderate Duration:  1 hour Timing:  Sporadic Number of daily episodes:  4 Quality:  Stomach contents Progression:  Resolved Chronicity:  New Recent urination:  Normal Context: not post-tussive   Relieved by:  Nothing Worsened by:  Nothing Ineffective treatments:  None tried Associated symptoms: cough and diarrhea   Associated symptoms: no fever   Associated symptoms comment:  Also cough Risk factors: no suspect food intake and no travel to endemic areas        Home Medications Prior to Admission medications   Medication Sig Start Date End Date Taking? Authorizing Provider  acetaminophen (TYLENOL) 325 MG tablet Take 650 mg by mouth every 6 (six) hours as needed for moderate pain or mild pain.    [provider]  atorvastatin (LIPITOR) 80 MG tablet TAKE 1 TABLET EVERY DAY  AT  6PM Patient taking differently: Take 80 mg by mouth daily. 05/08/21   Lauree Chandler, NP  Calcium Carbonate-Vitamin D 600-400 MG-UNIT tablet Take 1 tablet by mouth 2 (two) times daily. 09/20/20   Lauree Chandler, NP  clopidogrel (PLAVIX) 75 MG tablet TAKE 1 TABLET EVERY DAY Patient taking differently: Take 75 mg by mouth daily. 05/08/21   Lauree Chandler, NP  feeding supplement (ENSURE ENLIVE / ENSURE PLUS) LIQD DRINK 1-CAN (237MLS) BY MOUTH AFTER LUNCH Patient taking differently: Take 237 mLs by mouth See admin instructions. Daily after lunch 09/30/21   Lauree Chandler, NP  fluticasone (FLONASE) 50 MCG/ACT nasal spray Place 1 spray into both nostrils daily as needed for allergies.    [provider]  levETIRAcetam (KEPPRA) 250 MG tablet Take 1 tablet (250 mg total) by mouth 2 (two) times daily.  03/25/21 03/23/48  Alric Ran, MD  lisinopril (ZESTRIL) 10 MG tablet Take 10 mg by mouth daily. 09/29/21   [provider]  loperamide (IMODIUM) 2 MG capsule Take 4 mg by mouth as needed for diarrhea or loose stools.    [provider]  memantine (NAMENDA) 5 MG tablet Take 1 tablet (5 mg total) by mouth 2 (two) times daily. 03/25/21 03/23/48  Alric Ran, MD  vitamin B-12 (CYANOCOBALAMIN) 1000 MCG tablet Take 1 tablet (1,000 mcg total) by mouth daily. 12/26/20   Lauree Chandler, NP      Allergies    Patient has no known allergies.    Review of Systems   Review of Systems  Constitutional:  Negative for fever.  HENT:  Negative for facial swelling.   Eyes:  Negative for redness.  Respiratory:  Positive for cough. Negative for shortness of breath, wheezing and stridor.   Cardiovascular:  Negative for chest pain, palpitations and leg swelling.  Gastrointestinal:  Positive for diarrhea and vomiting.  All other systems reviewed and are negative.   Physical Exam Updated Vital Signs BP (!) 128/41   Pulse 92   Temp 99.8 F (37.7 C) (Oral)   Resp 20   Ht 4' 11" (1.499 m)   Wt 43 kg   SpO2 98%   BMI 19.15 kg/m  Physical Exam Vitals and nursing note reviewed.  Constitutional:      General: She is not in acute distress.  Appearance: Normal appearance. She is well-developed.  HENT:     Head: Normocephalic and atraumatic.     Nose: Nose normal.  Eyes:     Extraocular Movements: Extraocular movements intact.     Pupils: Pupils are equal, round, and reactive to light.  Cardiovascular:     Rate and Rhythm: Normal rate and regular rhythm.     Pulses: Normal pulses.     Heart sounds: Normal heart sounds.  Pulmonary:     Effort: Pulmonary effort is normal. No respiratory distress.     Breath sounds: Rales present.  Abdominal:     General: Bowel sounds are normal. There is distension.     Palpations: Abdomen is soft.     Tenderness: There is no abdominal tenderness.  There is no guarding or rebound.  Genitourinary:    Vagina: No vaginal discharge.  Musculoskeletal:        General: Normal range of motion.     Cervical back: Normal range of motion and neck supple.  Skin:    General: Skin is warm and dry.     Capillary Refill: Capillary refill takes less than 2 seconds.     Findings: No erythema or rash.  Neurological:     General: No focal deficit present.     Mental Status: She is alert and oriented to person, place, and time.     Deep Tendon Reflexes: Reflexes normal.  Psychiatric:        Mood and Affect: Mood normal.        Behavior: Behavior normal.     ED Results / Procedures / Treatments   Labs (all labs ordered are listed, but only abnormal results are displayed) Results for orders placed or performed during the hospital encounter of 11/27/21  SARS Coronavirus 2 by RT PCR (hospital order, performed in [place] hospital lab) *cepheid single result test* Anterior Nasal Swab   Specimen: Anterior Nasal Swab  Result Value Ref Range   SARS Coronavirus 2 by RT PCR NEGATIVE NEGATIVE  Comprehensive metabolic panel  Result Value Ref Range   Sodium 132 (L) 135 - 145 mmol/L   Potassium 5.2 (H) 3.5 - 5.1 mmol/L   Chloride 101 98 - 111 mmol/L   CO2 21 (L) 22 - 32 mmol/L   Glucose, Bld 109 (H) 70 - 99 mg/dL   BUN 37 (H) 8 - 23 mg/dL   Creatinine, Ser 1.07 (H) 0.44 - 1.00 mg/dL   Calcium 9.1 8.9 - 10.3 mg/dL   Total Protein 6.7 6.5 - 8.1 g/dL   Albumin 4.1 3.5 - 5.0 g/dL   AST 25 15 - 41 U/L   ALT 19 0 - 44 U/L   Alkaline Phosphatase 139 (H) 38 - 126 U/L   Total Bilirubin 1.0 0.3 - 1.2 mg/dL   GFR, Estimated 50 (L) >60 mL/min   Anion gap 10 5 - 15  CBC  Result Value Ref Range   WBC 3.1 (L) 4.0 - 10.5 K/uL   RBC 2.36 (L) 3.87 - 5.11 MIL/uL   Hemoglobin 7.4 (L) 12.0 - 15.0 g/dL   HCT 23.5 (L) 36.0 - 46.0 %   MCV 99.6 80.0 - 100.0 fL   MCH 31.4 26.0 - 34.0 pg   MCHC 31.5 30.0 - 36.0 g/dL   RDW 24.2 (H) 11.5 - 15.5 %   Platelets 63 (L)  150 - 400 K/uL   nRBC 1.0 (H) 0.0 - 0.2 %  Troponin I (High Sensitivity)  Result Value Ref Range  Troponin I (High Sensitivity) 22 (H) <18 ng/L   DG Chest Portable 1 View  Result Date: 11/27/2021 CLINICAL DATA:  Cough EXAM: PORTABLE CHEST 1 VIEW COMPARISON:  10/22/2021 FINDINGS: Normal heart size and mediastinal contours. No acute infiltrate or edema. No effusion or pneumothorax. Generalized osteopenia and interstitial coarsening, recent bone marrow biopsy. IMPRESSION: Stable from prior.  No acute finding. Electronically Signed   By: Jorje Guild M.D.   On: 11/27/2021 06:11   CT BONE MARROW BIOPSY & ASPIRATION  Result Date: 11/03/2021 INDICATION: Thrombocytopenia EXAM: CT GUIDED BONE MARROW ASPIRATION AND CORE BIOPSY MEDICATIONS: None. ANESTHESIA/SEDATION: Moderate (conscious) sedation was employed during this procedure. A total of Versed 0.25 mg and Fentanyl 25 mcg was administered intravenously. Moderate Sedation Time: 20 minutes. The patient's level of consciousness and vital signs were monitored continuously by radiology nursing throughout the procedure under my direct supervision. FLUOROSCOPY TIME:  CT dose in mGy was not provided. COMPLICATIONS: None immediate. Estimated blood loss: <5 mL PROCEDURE: RADIATION DOSE REDUCTION: This exam was performed according to the departmental dose-optimization program which includes automated exposure control, adjustment of the mA and/or kV according to patient size and/or use of iterative reconstruction technique. Informed written consent was obtained from the the patient and/or patient's representative after a thorough discussion of the procedural risks, benefits and alternatives. All questions were addressed. Maximal Sterile Barrier Technique was utilized including caps, mask, sterile gowns, sterile gloves, sterile drape, hand hygiene and skin antiseptic. A timeout was performed prior to the initiation of the procedure. The patient was positioned prone and  non-contrast localization CT was performed of the pelvis to demonstrate the iliac marrow spaces. Maximal barrier sterile technique utilized including caps, mask, sterile gowns, sterile gloves, large sterile drape, hand hygiene, and chlorhexidine prep. Under sterile conditions and local anesthesia, an 11 gauge coaxial bone biopsy needle was advanced into the RIGHT iliac marrow space. Needle position was confirmed with CT imaging. Initially, bone marrow aspiration was performed. Next, the 11 gauge outer cannula was utilized to obtain a 2 iliac bone marrow core biopsy. Needle was removed. Hemostasis was obtained with compression. The patient tolerated the procedure well. Samples were prepared with the cytotechnologist. IMPRESSION: Successful CT-guided bone marrow aspiration and biopsy, as above. Michaelle Birks, MD [facility] Electronically Signed   By: Michaelle Birks M.D.   On: 11/03/2021 10:51      EKG  EKG Interpretation  Date/Time:  Thursday November 27 2021 04:39:38 EDT Ventricular Rate:  90 PR Interval:  150 QRS Duration: 125 QT Interval:  370 QTC Calculation: 453 R Axis:   82 Text Interpretation: Sinus rhythm Nonspecific intraventricular conduction delay Anteroseptal infarct, old Confirmed by Randal Buba, Quentez Lober ([id number]) on 11/27/2021 6:59:09 AM         Radiology No results found.  Procedures Procedures    Medications Ordered in ED Medications  ondansetron (ZOFRAN) injection 4 mg (has no administration in time range)  0.9 %  sodium chloride infusion (has no administration in time range)  sodium chloride 0.9 % bolus 500 mL (has no administration in time range)    ED Course/ Medical Decision Making/ A&P                           Medical Decision Making Patient from nursing home by EMS with nausea vomiting diarrhea and cough that is productive   Problems Addressed: Acute cough:    Details: Is productive and has rales.  Borderline elevated temperature Acute urinary retention: acute illness or injury    Details: Foley inserted to relieve retention, urine sent for urinalysis  Elevated troponin I level:    Details: Likely due to demand ischemia Hyperkalemia:    Details: Mild, fluids given  Nausea vomiting and diarrhea: acute illness or injury    Details: Currently feeling better with nausea and vomiting  Pancytopenia ([place]):    Details: Worsening since 8/28. Type and screen ordered   Amount and/or Complexity of Data Reviewed Independent Historian: EMS    Details: See above  External Data Reviewed: labs and notes.    Details: Previous outpatient labs and previous  Labs: ordered.    Details: All labs reviewed:  sodium low 132, potassium slight high 5.2, elevated BUN 37, creatinine 1.07.  Pancytopenia:  3.1, hemoglobin 7.4, platelets 63K.  COVID negative  Radiology: ordered and independent interpretation performed.    Details: Urinary retention on CT abdomen.   ECG/medicine tests: ordered and independent interpretation performed. Decision-making details documented in ED Course. Discussion of management or test interpretation with external provider(s): Dr. Myna Hidalgo to admit   Risk Prescription drug management. Decision regarding hospitalization. Risk Details: Patient is mildly dehydrated for nausea and vomiting and diarrhea.  She has a productive cough, borderline temperature and rales on exam and I believe she has an occult pneumonia that she will fluff out with hydration.  This will be an HCAP.  I have gotten blood and urine cultures and started HCAP antibiotics.      Final Clinical Impression(s) / ED Diagnoses Final diagnoses:  Pancytopenia ([place])  Nausea vomiting and diarrhea   The patient appears reasonably stabilized for admission considering the current resources, flow, and capabilities available in the ED at this time, and I doubt any other [facility] requiring further screening and/or treatment in the ED  prior to admission.  Rx / DC Orders ED Discharge Orders     None         Makari Sanko, MD 11/27/21 [phone]

## 2021-11-28 DIAGNOSIS — I10 Essential (primary) hypertension: Secondary | ICD-10-CM | POA: Diagnosis not present

## 2021-11-28 DIAGNOSIS — R778 Other specified abnormalities of plasma proteins: Secondary | ICD-10-CM | POA: Diagnosis not present

## 2021-11-28 DIAGNOSIS — R197 Diarrhea, unspecified: Secondary | ICD-10-CM

## 2021-11-28 DIAGNOSIS — E875 Hyperkalemia: Secondary | ICD-10-CM | POA: Diagnosis not present

## 2021-11-28 DIAGNOSIS — D61818 Other pancytopenia: Secondary | ICD-10-CM | POA: Diagnosis not present

## 2021-11-28 DIAGNOSIS — R2689 Other abnormalities of gait and mobility: Secondary | ICD-10-CM | POA: Diagnosis not present

## 2021-11-28 DIAGNOSIS — R338 Other retention of urine: Secondary | ICD-10-CM | POA: Diagnosis not present

## 2021-11-28 DIAGNOSIS — K2970 Gastritis, unspecified, without bleeding: Secondary | ICD-10-CM | POA: Diagnosis not present

## 2021-11-28 DIAGNOSIS — E860 Dehydration: Secondary | ICD-10-CM | POA: Diagnosis not present

## 2021-11-28 DIAGNOSIS — Z87898 Personal history of other specified conditions: Secondary | ICD-10-CM | POA: Diagnosis not present

## 2021-11-28 DIAGNOSIS — R112 Nausea with vomiting, unspecified: Secondary | ICD-10-CM | POA: Diagnosis not present

## 2021-11-28 LAB — BASIC METABOLIC PANEL
Anion gap: 4 — ABNORMAL LOW (ref 5–15)
BUN: 26 mg/dL — ABNORMAL HIGH (ref 8–23)
CO2: 24 mmol/L (ref 22–32)
Calcium: 8.3 mg/dL — ABNORMAL LOW (ref 8.9–10.3)
Chloride: 107 mmol/L (ref 98–111)
Creatinine, Ser: 0.94 mg/dL (ref 0.44–1.00)
GFR, Estimated: 58 mL/min — ABNORMAL LOW (ref >60)
Glucose, Bld: 96 mg/dL (ref 70–99)
Potassium: 4.6 mmol/L (ref 3.5–5.1)
Sodium: 135 mmol/L (ref 135–145)

## 2021-11-28 LAB — URINE CULTURE
Culture: NO GROWTH
Special Requests: NORMAL

## 2021-11-28 LAB — CBC
HCT: 24.3 % — ABNORMAL LOW (ref 36.0–46.0)
Hemoglobin: 7.8 g/dL — ABNORMAL LOW (ref 12.0–15.0)
MCH: 31.2 pg (ref 26.0–34.0)
MCHC: 32.1 g/dL (ref 30.0–36.0)
MCV: 97.2 fL (ref 80.0–100.0)
Platelets: 45 10*3/uL — ABNORMAL LOW (ref 150–400)
RBC: 2.5 MIL/uL — ABNORMAL LOW (ref 3.87–5.11)
RDW: 22.1 % — ABNORMAL HIGH (ref 11.5–15.5)
WBC: 2.9 10*3/uL — ABNORMAL LOW (ref 4.0–10.5)
nRBC: 1 % — ABNORMAL HIGH (ref 0.0–0.2)

## 2021-11-28 LAB — PREPARE RBC (CROSSMATCH)

## 2021-11-28 MED ORDER — CHLORHEXIDINE GLUCONATE CLOTH 2 % EX PADS
6.0000 | MEDICATED_PAD | Freq: Every day | CUTANEOUS | Status: DC
  Administered 2021-11-28 – 2021-11-29 (×2): 6 via TOPICAL

## 2021-11-28 MED ORDER — SODIUM CHLORIDE 0.9% IV SOLUTION: Freq: Once | INTRAVENOUS | Status: AC

## 2021-11-28 MED ORDER — MUSCLE RUB 10-15 % EX CREA
TOPICAL_CREAM | CUTANEOUS | Status: DC | PRN
  Filled 2021-11-28: qty 85

## 2021-11-28 NOTE — TOC Initial Note (Signed)
Transition of Care [facility]) - Initial/Assessment Note    Patient Details  Name: Sarah Hampton MRN: [id number] Date of Birth: [birth year]  Transition of Care [facility]) CM/SW Contact:    Vassie Moselle, LCSW Phone Number: 11/28/2021, 10:54 AM  Clinical Narrative:                 Pt currently resides at [facility]F. CSW spoke with Harrington Challenger, RN [phone]) at [facility] and confirmed pt is able to return once medically ready. FL-2 has been completed for pt to return to their facility at discharge.   Expected Discharge Plan: Assisted Living Barriers to Discharge: No Barriers Identified   Patient Goals and CMS Choice Patient states their goals for this hospitalization and ongoing recovery are:: To return to [facility]   Choice offered to / list presented to : Patient  Expected Discharge Plan and Services Expected Discharge Plan: Assisted Living In-house Referral: NA Discharge Planning Services: NA Post Acute Care Choice: Nursing Home Living arrangements for the past 2 months: [place]                 DME Arranged: N/A DME Agency: NA                  Prior Living Arrangements/Services Living arrangements for the past 2 months: [place] Lives with:: Facility Resident Patient language and need for interpreter reviewed:: Yes Do you feel safe going back to the place where you live?: Yes      Need for Family Participation in Patient Care: No (Comment) Care giver support system in place?: Yes (comment)   Criminal Activity/Legal Involvement Pertinent to Current Situation/Hospitalization: No - Comment as needed  Activities of Daily Living Home Assistive Devices/Equipment: Hearing aid, Eyeglasses, Walker (specify type) ADL Screening (condition at time of admission) Patient's cognitive ability adequate to safely complete daily activities?: Yes Is the patient deaf or have difficulty hearing?: Yes Does the patient have difficulty seeing,  even when wearing glasses/contacts?: No Does the patient have difficulty concentrating, remembering, or making decisions?: No Patient able to express need for assistance with ADLs?: Yes Does the patient have difficulty dressing or bathing?: No Independently performs ADLs?: Yes (appropriate for developmental age) Does the patient have difficulty walking or climbing stairs?: Yes Weakness of Legs: Both Weakness of Arms/Hands: Both  Permission Sought/Granted Permission sought to share information with : Facility Sport and exercise psychologist, Family Supports Permission granted to share information with : Yes, Verbal Permission Granted  Share Information with NAME: D.J. Watt     Permission granted to share info w Relationship: Daughter  Permission granted to share info w Contact Information: [phone]  Emotional Assessment Appearance:: Appears stated age Attitude/Demeanor/Rapport: Engaged Affect (typically observed): Accepting Orientation: : Oriented to Self, Oriented to Place, Oriented to  Time, Oriented to Situation Alcohol / Substance Use: Not Applicable Psych Involvement: No (comment)  Admission diagnosis:  Dehydration [E86.0] Hyperkalemia [E87.5] Nausea & vomiting [R11.2] Elevated troponin I level [R77.8] Acute urinary retention [R33.8] Nausea vomiting and diarrhea [R11.2, R19.7] Pancytopenia (HCC) [D61.818] Acute cough [R05.1] Patient Active Problem List   Diagnosis Date Noted   Nausea & vomiting 11/27/2021   Hyperkalemia 11/27/2021   Pancytopenia ([place]) 11/27/2021   Gastritis 11/27/2021   Myelofibrosis ([place]) 11/20/2021   History of seizure 09/17/2021   Hyperbilirubinemia 09/17/2021   Hyponatremia 09/17/2021   Thrombocytopenia ([place]) 09/16/2021   Symptomatic anemia 09/16/2021   Macrocytic anemia 04/15/2020   Leukocytosis 04/15/2020   History of TIA (transient  ischemic attack) 04/15/2020   Other hyperlipidemia 11/12/2017   TIA (transient ischemic attack) 09/28/2017    Essential hypertension 09/28/2017   Chronic kidney disease, stage 3a ([place]) 09/28/2017   Arthritis 08/24/2017   Squamous cell carcinoma 08/24/2017   Widowed 08/24/2017   Personal history of malignant neoplasm of other organs and systems 10/16/2016   PCP:  Lauree Chandler, NP Pharmacy:   [id number] - [place], [place] [address] [place] [address] Phone: [phone] Fax: [phone]     Social Determinants of Health (SDOH) Interventions    Readmission Risk Interventions     No data to display

## 2021-11-28 NOTE — Evaluation (Signed)
Occupational Therapy Evaluation Patient Details Name: MECCA GUITRON MRN: [id number] DOB: 03/18/1931 Today's Date: 11/28/2021   History of Present Illness KEEARA FREES is an 88 y.o. female with medical history significant of osteoarthritis, shoulder melanoma, squamous cell carcinoma, TIA, hypertension, macrocytic anemia, hyperlipidemia, and seizure disorder. Patient was brought from her nursing facility due to several episodes of nausea/emesis an hour prior to arrival associated with malaise and several episodes of diarrhea over the past weekend.   Clinical Impression    Kyomi Hector is an 88 year old women that presents with generalized weakness and a history of impaired balance. Prior to hospitalization patient could perform ADLs with Moderate independence, using rollator for functional tasks. Today patient presents at min guard with verbal cues ( for hand positioning)  for sit to stand and using walker to ambulate. Patient able to ambulate using walker in hallway, but needed assist with walker management due to typically using rollator at assisted living facility. Patient able to don socks. Overall patient presents near baseline in regards to functional abilities.  No OT needs at this time and can return to ALF.       Recommendations for follow up therapy are one component of a multi-disciplinary discharge planning process, led by the attending physician.  Recommendations may be updated based on patient status, additional functional criteria and insurance authorization.   Follow Up Recommendations  Other (comment) (patient should continue OT services at assited living facility)    Assistance Recommended at Discharge Intermittent Supervision/Assistance  Patient can return home with the following A little help with walking and/or transfers;Direct supervision/assist for medications management;Direct supervision/assist for financial management;Assist for transportation;Help with stairs or  ramp for entrance;Assistance with cooking/housework;A little help with bathing/dressing/bathroom    Functional Status Assessment  Patient has not had a recent decline in their functional status  Equipment Recommendations       Recommendations for Other Services       Precautions / Restrictions Precautions Precautions: None Restrictions Weight Bearing Restrictions: No      Mobility Bed Mobility                    Transfers Overall transfer level: Needs assistance Equipment used: Rolling walker (2 wheels) Transfers: Sit to/from Stand Sit to Stand: Modified independent (Device/Increase time)                  Balance Overall balance assessment: Needs assistance Sitting-balance support: Feet supported Sitting balance-Leahy Scale: Good     Standing balance support: Reliant on assistive device for balance Standing balance-Leahy Scale: Good                             ADL either performed or assessed with clinical judgement   ADL Overall ADL's : Needs assistance/impaired Eating/Feeding: Set up;Sitting   Grooming: Sitting;Minimal assistance   Upper Body Bathing: Moderate assistance;Sitting   Lower Body Bathing: Sitting/lateral leans;Moderate assistance   Upper Body Dressing : Sitting;Moderate assistance   Lower Body Dressing: Moderate assistance;Sit to/from stand   Toilet Transfer: Rolling walker (2 wheels);Regular Toilet;Minimal assistance   Toileting- Clothing Manipulation and Hygiene: Moderate assistance;Sit to/from stand   Tub/ Banker: Walk-in shower;Moderate assistance;Rolling walker (2 wheels)           Vision Baseline Vision/History: 1 Wears glasses Vision Assessment?: No apparent visual deficits     Perception     Praxis      Pertinent Vitals/Pain Pain  Assessment Pain Assessment: No/denies pain     Hand Dominance Right   Extremity/Trunk Assessment Upper Extremity Assessment Upper Extremity Assessment:  Overall WFL for tasks assessed (patient 4/5 for b/l UE strength)   Lower Extremity Assessment Lower Extremity Assessment: Defer to PT evaluation       Communication Communication Communication: No difficulties   Cognition Arousal/Alertness: Awake/alert Behavior During Therapy: WFL for tasks assessed/performed Overall Cognitive Status: Within Functional Limits for tasks assessed                                       General Comments       Exercises     Shoulder Instructions      Home Living Family/patient expects to be discharged to:: Assisted living                             Home Equipment: Rollator (4 wheels);Grab bars - tub/shower;Toilet riser          Prior Functioning/Environment Prior Level of Function : Independent/Modified Independent             Mobility Comments: uses rollator          OT Problem List:        OT Treatment/Interventions:      OT Goals(Current goals can be found in the care plan section) Acute Rehab OT Goals OT Goal Formulation: All assessment and education complete, DC therapy  OT Frequency:      Co-evaluation              AM-PAC OT "6 Clicks" Daily Activity     Outcome Measure Help from another person eating meals?: None Help from another person taking care of personal grooming?: A Little Help from another person toileting, which includes using toliet, bedpan, or urinal?: A Little Help from another person bathing (including washing, rinsing, drying)?: A Lot Help from another person to put on and taking off regular upper body clothing?: A Little Help from another person to put on and taking off regular lower body clothing?: A Lot 6 Click Score: 17   End of Session Equipment Utilized During Treatment: Rolling walker (2 wheels) Nurse Communication: Mobility status  Activity Tolerance: Patient tolerated treatment well Patient left: in chair;with call bell/phone within reach;with bed alarm  set  OT Visit Diagnosis: Unsteadiness on feet (R26.81);Muscle weakness (generalized) (M62.81);Other abnormalities of gait and mobility (R26.89);Other symptoms and signs involving cognitive function                Time: 0242-0303 OT Time Calculation (min): 21 min Charges:  OT General Charges $OT Visit: 1 Visit OT Evaluation $OT Eval Low Complexity: [place], OTS Acute rehab services  Charlann Lange 11/28/2021, 4:09 PM

## 2021-11-28 NOTE — NC FL2 (Signed)
[place] LEVEL OF CARE SCREENING TOOL     IDENTIFICATION  Patient Name: Sarah Hampton Birthdate: [birth year] Sex: female Admission Date (Current Location): 11/27/2021  [facility] and [place] Number:  Herbalist and Address:  [facility],  [place], [place]      Provider Number: [id number]  Attending Physician Name and Address:  Hosie Poisson, MD  Relative Name and Phone Number:  Daughter, Eulas Post [phone]    Current Level of Care: Hospital Recommended Level of Care: Assisted Living Facility Prior Approval Number:    Date Approved/Denied:   PASRR Number:    Discharge Plan: Other (Comment) (ALF)    Current Diagnoses: Patient Active Problem List   Diagnosis Date Noted   Nausea & vomiting 11/27/2021   Hyperkalemia 11/27/2021   Pancytopenia ([place]) 11/27/2021   Gastritis 11/27/2021   Myelofibrosis ([place]) 11/20/2021   History of seizure 09/17/2021   Hyperbilirubinemia 09/17/2021   Hyponatremia 09/17/2021   Thrombocytopenia ([place]) 09/16/2021   Symptomatic anemia 09/16/2021   Macrocytic anemia 04/15/2020   Leukocytosis 04/15/2020   History of TIA (transient ischemic attack) 04/15/2020   Other hyperlipidemia 11/12/2017   TIA (transient ischemic attack) 09/28/2017   Essential hypertension 09/28/2017   Chronic kidney disease, stage 3a ([place]) 09/28/2017   Arthritis 08/24/2017   Squamous cell carcinoma 08/24/2017   Widowed 08/24/2017   Personal history of malignant neoplasm of other organs and systems 10/16/2016    Orientation RESPIRATION BLADDER Height & Weight     Self, Time, Situation, Place  Normal Incontinent, External catheter Weight: 94 lb 12.8 oz (43 kg) Height:  '4\' 11"'$  (149.9 cm)  BEHAVIORAL SYMPTOMS/MOOD NEUROLOGICAL BOWEL NUTRITION STATUS      Continent Diet (Regular)  AMBULATORY STATUS COMMUNICATION OF NEEDS Skin   Limited Assist Verbally Normal                       Personal Care Assistance  Level of Assistance  Bathing, Feeding, Dressing Bathing Assistance: Limited assistance Feeding assistance: Independent Dressing Assistance: Limited assistance     Functional Limitations Info  Sight, Hearing, Speech Sight Info: Adequate Hearing Info: Impaired Speech Info: Adequate    SPECIAL CARE FACTORS FREQUENCY                       Contractures Contractures Info: Not present    Additional Factors Info  Code Status, Allergies Code Status Info: DNR Allergies Info: No Known Allergies           Current Medications (11/28/2021):  This is the current hospital active medication list Current Facility-Administered Medications  Medication Dose Route Frequency Provider Last Rate Last Admin   0.9 %  sodium chloride infusion (Manually program via Guardrails IV Fluids)   Intravenous Once Reubin Milan, MD   Held at 11/27/21 1042   acetaminophen (TYLENOL) tablet 500 mg  500 mg Oral Q6H PRN Reubin Milan, MD   500 mg at 11/28/21 1275   Or   acetaminophen (TYLENOL) suppository 650 mg  650 mg Rectal Q8H PRN Reubin Milan, MD       atorvastatin (LIPITOR) tablet 80 mg  80 mg Oral Daily Reubin Milan, MD   80 mg at 11/28/21 0856   calcium-vitamin D (OSCAL WITH D) 500-5 MG-MCG per tablet 1 tablet  1 tablet Oral BID WC Reubin Milan, MD   1 tablet at 11/28/21 0856   cefTRIAXone (ROCEPHIN) 2 g in  sodium chloride 0.9 % 100 mL IVPB  2 g Intravenous Q24H Reubin Milan, MD       Chlorhexidine Gluconate Cloth 2 % PADS 6 each  6 each Topical Daily Hosie Poisson, MD       feeding supplement (ENSURE ENLIVE / ENSURE PLUS) liquid 237 mL  237 mL Oral QPC lunch Reubin Milan, MD       levETIRAcetam (KEPPRA) tablet 250 mg  250 mg Oral BID Reubin Milan, MD   250 mg at 11/28/21 0856   memantine (NAMENDA) tablet 5 mg  5 mg Oral BID Reubin Milan, MD   5 mg at 11/28/21 0856   metroNIDAZOLE (FLAGYL) IVPB 500 mg  500 mg Intravenous Q12H Reubin Milan, MD       midodrine (PROAMATINE) tablet 5 mg  5 mg Oral TID WC Reubin Milan, MD   5 mg at 11/28/21 0856   ondansetron (ZOFRAN) tablet 4 mg  4 mg Oral Q6H PRN Reubin Milan, MD       Or   ondansetron [facility]) injection 4 mg  4 mg Intravenous Q6H PRN Reubin Milan, MD       pantoprazole (PROTONIX) EC tablet 20 mg  20 mg Oral BID Reubin Milan, MD   20 mg at 11/28/21 4037     Discharge Medications: Please see discharge summary for a list of discharge medications.  Relevant Imaging Results:  Relevant Lab Results:   Additional Information SSN: [id number]  Vassie Moselle, LCSW

## 2021-11-28 NOTE — Progress Notes (Addendum)
PROGRESS NOTE    Sarah Hampton  FTD:[id number] DOB: 06/16/1930 DOA: 11/27/2021 PCP: Lauree Chandler, NP    Chief Complaint  Patient presents with   Emesis    Brief Narrative:    Sarah Hampton is a 88 y.o. female with medical history significant of osteoarthritis, shoulder melanoma, squamous cell carcinoma, TIA, hypertension, macrocytic anemia, hyperlipidemia, seizure disorder who was brought from her nursing facility due to several episodes of nausea/emesis an hour prior to arrival associated with malaise and several episodes of diarrhea over the past weekend.  She gets frequent indigestion and heartburn.  Assessment & Plan:   Principal Problem:   Nausea & vomiting Active Problems:   Essential hypertension   Other hyperlipidemia   History of seizure   Hyponatremia   Hyperkalemia   Pancytopenia (HCC)   Gastritis  Nausea, vomiting and diarrhea:  Appears to have resolved. Probably viral gastroenteritis.  She denies any vomiting and abdominal pain.  Afebrile and wbc count is 2.9 this am.  Continue with PPI for gastritis.  She was empirically started on IV antibiotics for hypotension and abdominal pain yesterday. Would start de escalating antibiotics in am.    Hypertension:  BP borderline low yesterday, improved with IV fluids.  Bp parameters have improved.    Myelofibrosis:  S/p recent bone marrow biopsy.  - transfuse to keep hemoglobin greater than 7, s/p 1 unit of Prbc transfusion, another unit ordered today.  - platelet count at 45,000, baseline platelet count at 50 to 70 k.  - transfuse platelets when less than 20,000.     Hyponatremia:  Resolved with hydration.     Hyperkalemia:  Resolved with hydration.    Gastritis:  On PPI.    Elevated troponin from demand ischemia from dehydration.  She currently denies chest pain .  EKG shows Sinus rhythm Nonspecific intraventricular conduction delay Anteroseptal infarct, old.  H/o TIA's Was on plavix,  which is on hold for thrombocytopenia.    Seizures:  Continue with keppra.    Hyperlipidemia:  Resume lipitor.   Acute urinary retention:  S/p foley catheter,  UA and urine cultures are negative.     DVT prophylaxis: (Lovenox) Code Status: DNR Family Communication: Discussed the plan with daughter over the phone.  Disposition:   Status is: Observation The patient will require care spanning > 2 midnights and should be moved to inpatient because: IV fluids, PRBC transfusion.    Level of care: Telemetry Consultants:  None.   Procedures:   Antimicrobials: IV flagyl. IV rocephin.    Subjective: No nausea, vomiting.   Objective: Vitals:   11/27/21 1735 11/27/21 2033 11/28/21 0053 11/28/21 0557  BP: (!) 117/44 (!) 99/42 (!) 108/41 (!) 111/56  Pulse: 80 74 78 75  Resp: _0 Temp: 98.5 F (36.9 C) 97.9 F (36.6 C) 98 F (36.7 C) 98.6 F (37 C)  TempSrc: Oral     SpO2: 99% 100% 98% 97%  Weight:      Height:        Intake/Output Summary (Last 24 hours) at 11/28/2021 1305 Last data filed at 11/28/2021 0933 Gross per 24 hour  Intake 1141.67 ml  Output 1350 ml  Net -208.33 ml   Filed Weights   11/27/21 0419  Weight: 43 kg    Examination:  General exam: Appears calm and comfortable  Respiratory system: Clear to auscultation. Respiratory effort normal. Cardiovascular system: S1 & S2 heard, RRR. No JVD, murmurs, rubs, gallops or clicks. No pedal  edema. Gastrointestinal system: Abdomen is nondistended, soft and nontender. No organomegaly or masses felt. Normal bowel sounds heard. Central nervous system: Alert and oriented. No focal neurological deficits. Extremities: Symmetric 5 x 5 power. Skin: No rashes, lesions or ulcers Psychiatry: Judgement and insight appear normal. Mood & affect appropriate.     Data Reviewed: I have personally reviewed following labs and imaging studies  CBC: Recent Labs  Lab 11/27/21 0435 11/27/21 1725 11/28/21 0554   WBC 3.1*  --  2.9*  HGB 7.4* 7.6* 7.8*  HCT 23.5* 23.8* 24.3*  MCV 99.6  --  97.2  PLT 63*  --  45*    Basic Metabolic Panel: Recent Labs  Lab 11/27/21 0435 11/28/21 0554  NA 132* 135  K 5.2* 4.6  CL 101 107  CO2 21* 24  GLUCOSE 109* 96  BUN 37* 26*  CREATININE 1.07* 0.94  CALCIUM 9.1 8.3*    GFR: Estimated Creatinine Clearance: 27.5 mL/min (by C-G formula based on SCr of 0.94 mg/dL).  Liver Function Tests: Recent Labs  Lab 11/27/21 0435  AST 25  ALT 19  ALKPHOS 139*  BILITOT 1.0  PROT 6.7  ALBUMIN 4.1    CBG: No results for input(s): "GLUCAP" in the last 168 hours.   Recent Results (from the past 240 hour(s))  SARS Coronavirus 2 by RT PCR (hospital order, performed in [facility] hospital lab) *cepheid single result test* Anterior Nasal Swab     Status: None   Collection Time: 11/27/21  4:43 AM   Specimen: Anterior Nasal Swab  Result Value Ref Range Status   SARS Coronavirus 2 by RT PCR NEGATIVE NEGATIVE Final    Comment: (NOTE) SARS-CoV-2 target nucleic acids are NOT DETECTED.  The SARS-CoV-2 RNA is generally detectable in upper and lower respiratory specimens during the acute phase of infection. The lowest concentration of SARS-CoV-2 viral copies this assay can detect is 250 copies / mL. A negative result does not preclude SARS-CoV-2 infection and should not be used as the sole basis for treatment or other patient management decisions.  A negative result may occur with improper specimen collection / handling, submission of specimen other than nasopharyngeal swab, presence of viral mutation(s) within the areas targeted by this assay, and inadequate number of viral copies (<250 copies / mL). A negative result must be combined with clinical observations, patient history, and epidemiological information.  Fact Sheet for Patients:   https://www.patel.info/  Fact Sheet for Healthcare  Providers: https://hall.com/  This test is not yet approved or  cleared by the [place] FDA and has been authorized for detection and/or diagnosis of SARS-CoV-2 by FDA under an Emergency Use Authorization (EUA).  This EUA will remain in effect (meaning this test can be used) for the duration of the COVID-19 declaration under Section 564(b)(1) of the Act, 21 U.S.C. section 360bbb-3(b)(1), unless the authorization is terminated or revoked sooner.  Performed at [facility], [place] [address], [place], [place] [address]   Blood culture (routine x 2)     Status: None (Preliminary result)   Collection Time: 11/27/21  7:06 AM   Specimen: BLOOD  Result Value Ref Range Status   Specimen Description   Final    BLOOD LEFT ANTECUBITAL Performed at [place] [address] [place], [place] [address]    Special Requests   Final    BOTTLES DRAWN AEROBIC AND ANAEROBIC Blood Culture results may not be optimal due to an excessive volume of blood received in  culture bottles Performed at [place] [address], [place], [place] [address]    Culture   Final    NO GROWTH < 24 HOURS Performed at [place] [address], [place], [place] [address]    Report Status PENDING  Incomplete  Blood culture (routine x 2)     Status: None (Preliminary result)   Collection Time: 11/27/21  7:06 AM   Specimen: BLOOD  Result Value Ref Range Status   Specimen Description   Final    BLOOD RIGHT ANTECUBITAL Performed at [place] [address] [place], [place] [address]    Special Requests   Final    BOTTLES DRAWN AEROBIC AND ANAEROBIC Blood Culture results may not be optimal due to an excessive volume of blood received in culture bottles Performed at [place] [address] [place], [place] [address]    Culture   Final    NO GROWTH < 24 HOURS Performed at  [place] [address] [place], [place] [address]    Report Status PENDING  Incomplete  Urine Culture     Status: None   Collection Time: 11/27/21  7:06 AM   Specimen: Urine, Clean Catch  Result Value Ref Range Status   Specimen Description   Final    URINE, CLEAN CATCH Performed at [facility], [place] [address], [place], [place] [address]    Special Requests   Final    Normal Performed at [facility], [place] [address], [place], [place] [address]    Culture   Final    NO GROWTH Performed at [facility], [place] [address], [place], [place] [address]    Report Status 11/28/2021 FINAL  Final         Radiology Studies: CT ABDOMEN PELVIS W CONTRAST  Result Date: 11/27/2021 CLINICAL DATA:  Abdominal pain. EXAM: CT ABDOMEN AND PELVIS WITH CONTRAST TECHNIQUE: Multidetector CT imaging of the abdomen and pelvis was performed using the standard protocol following bolus administration of intravenous contrast. RADIATION DOSE REDUCTION: This exam was performed according to the departmental dose-optimization program which includes automated exposure control, adjustment of the mA and/or kV according to patient size and/or use of iterative reconstruction technique. CONTRAST:  80mL OMNIPAQUE IOHEXOL 300 MG/ML  SOLN COMPARISON:  Only prior study is CT abdomen and pelvis with contrast 10/22/2021 FINDINGS: Lower chest: There is diffuse bronchial thickening in the lower lobes. In the posterior bases there are scattered small bronchiolar impactions consistent with small airways disease. This was seen previously and no bronchopneumonia is evident. There are linear atelectatic foci. The heart is borderline prominent. There are three-vessel coronary artery calcifications with heavy calcification in the mitral ring. There is no pericardial effusion. Mild-to-moderate elevation right hemidiaphragm. Hepatobiliary: Interval new demonstration of a mild intrahepatic  periportal edema which is most likely due to fluid overload, less likely hepatitis. Unremarkable gallbladder and bile ducts. Otherwise unremarkable liver parenchyma. Spleen: Mildly prominent, unchanged measuring 14.4 cm in length. There is a calcified granuloma posteriorly. No mass enhancement. Pancreas: No focal abnormality. Adrenals/Urinary Tract: There is no adrenal mass. There is mild bilateral hydronephrosis which is probably due to the distended bladder with the dome reaching as high as L5-S1. No wall thickening is seen in the bladder and no urinary stone is evident with renovascular calcifications along both renal hila. There is no renal cortical mass. There is symmetric excretion in the delayed phase Stomach/Bowel:  The gastric wall is contracted. There are at least mildly thickened folds of mid to distal stomach. The unopacified small bowel is unremarkable. The appendix is normal caliber. There is mild fecal stasis. Left-sided diverticulosis without evidence of diverticulitis. Vascular/Lymphatic: There is heavy aortoiliac calcific plaque, branch vessel atherosclerosis without AAA. No adenopathy is seen. Reproductive: No mass or adnexal abnormality is suspected. There are pelvic phleboliths. Other: There is interval weight loss and decreased thickness of the body fat. There is no free fluid, free air or hemorrhage. There is no incarcerated hernia. Musculoskeletal: There is moderate lumbar dextroscoliosis and advanced degenerative change, moderate hip DJD right-greater-than-left. Osseous structures are dense and heterogeneous throughout, which was seen previously. No destructive lesion is evident. IMPRESSION: 1. Thickened folds in the stomach.  Probable gastritis. 2. Bilateral lower lobe small airways disease with scattered small bronchiolar impactions. No bronchopneumonia. 3. Constipation without bowel obstruction. 4. Intrahepatic periportal edema most likely due to fluid overload, less likely hepatitis. 5.  Interval weight loss since 10/22/2021 and decreased thickness of the body fat. 6. Distended bladder with mild hydronephrosis likely due to this. No obstructing stone. 7. Scoliosis and degenerative change lumbar spine. 8. Diffuse heterogeneity and increased density of the bones, which could relate to renal osteodystrophy or a marrow replacement disorder. Further evaluation recommended. Electronically Signed   By: Telford Nab M.D.   On: 11/27/2021 06:58   DG Chest Portable 1 View  Result Date: 11/27/2021 CLINICAL DATA:  Cough EXAM: PORTABLE CHEST 1 VIEW COMPARISON:  10/22/2021 FINDINGS: Normal heart size and mediastinal contours. No acute infiltrate or edema. No effusion or pneumothorax. Generalized osteopenia and interstitial coarsening, recent bone marrow biopsy. IMPRESSION: Stable from prior.  No acute finding. Electronically Signed   By: Jorje Guild M.D.   On: 11/27/2021 06:11        Scheduled Meds:  sodium chloride   Intravenous Once   sodium chloride   Intravenous Once   atorvastatin  80 mg Oral Daily   calcium-vitamin D  1 tablet Oral BID WC   Chlorhexidine Gluconate Cloth  6 each Topical Daily   feeding supplement  237 mL Oral QPC lunch   levETIRAcetam  250 mg Oral BID   memantine  5 mg Oral BID   midodrine  5 mg Oral TID WC   pantoprazole  20 mg Oral BID   Continuous Infusions:  cefTRIAXone (ROCEPHIN)  IV     metronidazole       LOS: 0 days       Hosie Poisson, MD [facility]   To contact the attending provider between 7A-7P or the covering provider during after hours 7P-7A, please log into the web site [email or web address] and access using universal [place] password for that web site. If you do not have the password, please call the hospital operator.  11/28/2021, 1:05 PM

## 2021-11-29 DIAGNOSIS — R338 Other retention of urine: Secondary | ICD-10-CM | POA: Diagnosis not present

## 2021-11-29 DIAGNOSIS — Z87898 Personal history of other specified conditions: Secondary | ICD-10-CM | POA: Diagnosis not present

## 2021-11-29 DIAGNOSIS — E860 Dehydration: Secondary | ICD-10-CM | POA: Diagnosis not present

## 2021-11-29 DIAGNOSIS — D61818 Other pancytopenia: Secondary | ICD-10-CM | POA: Diagnosis not present

## 2021-11-29 DIAGNOSIS — R112 Nausea with vomiting, unspecified: Secondary | ICD-10-CM | POA: Diagnosis not present

## 2021-11-29 DIAGNOSIS — E875 Hyperkalemia: Secondary | ICD-10-CM | POA: Diagnosis not present

## 2021-11-29 DIAGNOSIS — R778 Other specified abnormalities of plasma proteins: Secondary | ICD-10-CM | POA: Diagnosis not present

## 2021-11-29 DIAGNOSIS — I10 Essential (primary) hypertension: Secondary | ICD-10-CM | POA: Diagnosis not present

## 2021-11-29 DIAGNOSIS — K2970 Gastritis, unspecified, without bleeding: Secondary | ICD-10-CM | POA: Diagnosis not present

## 2021-11-29 LAB — TYPE AND SCREEN
ABO/RH(D): A NEG
Antibody Screen: NEGATIVE
Unit division: 0
Unit division: 0

## 2021-11-29 LAB — CBC WITH DIFFERENTIAL/PLATELET
Abs Immature Granulocytes: 0.3 10*3/uL — ABNORMAL HIGH (ref 0.00–0.07)
Basophils Absolute: 0.1 10*3/uL (ref 0.0–0.1)
Basophils Relative: 2 %
Eosinophils Absolute: 0.1 10*3/uL (ref 0.0–0.5)
Eosinophils Relative: 3 %
HCT: 30.6 % — ABNORMAL LOW (ref 36.0–46.0)
Hemoglobin: 9.7 g/dL — ABNORMAL LOW (ref 12.0–15.0)
Immature Granulocytes: 10 %
Lymphocytes Relative: 20 %
Lymphs Abs: 0.6 10*3/uL — ABNORMAL LOW (ref 0.7–4.0)
MCH: 29.4 pg (ref 26.0–34.0)
MCHC: 31.7 g/dL (ref 30.0–36.0)
MCV: 92.7 fL (ref 80.0–100.0)
Monocytes Absolute: 0.3 10*3/uL (ref 0.1–1.0)
Monocytes Relative: 9 %
Neutro Abs: 1.8 10*3/uL (ref 1.7–7.7)
Neutrophils Relative %: 56 %
Platelets: 40 10*3/uL — ABNORMAL LOW (ref 150–400)
RBC: 3.3 MIL/uL — ABNORMAL LOW (ref 3.87–5.11)
RDW: 21.2 % — ABNORMAL HIGH (ref 11.5–15.5)
WBC: 3.1 10*3/uL — ABNORMAL LOW (ref 4.0–10.5)
nRBC: 0.6 % — ABNORMAL HIGH (ref 0.0–0.2)

## 2021-11-29 LAB — BPAM RBC
Blood Product Expiration Date: 202309232359
Blood Product Expiration Date: 202310042359
ISSUE DATE / TIME: 202309071314
ISSUE DATE / TIME: 202309081510
Unit Type and Rh: 600
Unit Type and Rh: 600

## 2021-11-29 NOTE — Progress Notes (Signed)
PROGRESS NOTE    Sarah Hampton  GYB:[id number] DOB: 09/04/1930 DOA: 11/27/2021 PCP: Lauree Chandler, NP    Chief Complaint  Patient presents with   Emesis    Brief Narrative:    Sarah Hampton is a 88 y.o. female with medical history significant of osteoarthritis, shoulder melanoma, squamous cell carcinoma, TIA, hypertension, macrocytic anemia, hyperlipidemia, seizure disorder who was brought from her nursing facility due to several episodes of nausea/emesis an hour prior to arrival associated with malaise and several episodes of diarrhea over the past weekend.  She gets frequent indigestion and heartburn. She was admitted for persistent nausea, vomiting and diarrhea .  Patient seen and examined, she reports all her symptoms have improved. She is able to tolerate soft diet without diarrhea.   Assessment & Plan:   Principal Problem:   Nausea & vomiting Active Problems:   Essential hypertension   Other hyperlipidemia   History of seizure   Hyponatremia   Hyperkalemia   Pancytopenia (HCC)   Gastritis  Nausea, vomiting and diarrhea:  Appears to have resolved. Probably viral gastroenteritis.  She denies any vomiting and abdominal pain.  Afebrile and wbc count is 2.9 this am.  Continue with PPI for gastritis.  D/c antibiotics today, she appears non toxic, able to tolerate soft diet without any symptoms.   Hypertension:  BP parameters are optimal.    Myelofibrosis:  S/p recent bone marrow biopsy.  - transfuse to keep hemoglobin greater than 7, s/p 2 units of PRBC transfusion. Hemoglobin improved to 9.  - platelet count at 40,000, baseline platelet count at 50 to 70 k.  - transfuse platelets when less than 20,000.     Hyponatremia:  Resolved with hydration.     Hyperkalemia:  Resolved with hydration.    Gastritis:  On PPI.    Elevated troponin from demand ischemia from dehydration.  She currently denies chest pain .  EKG shows Sinus rhythm Nonspecific  intraventricular conduction delay Anteroseptal infarct, old.  H/o TIA's Was on plavix, which  we will hold for thrombocytopenia.    Seizures:  Continue with keppra.    Hyperlipidemia:  Resume lipitor.   Acute urinary retention:  S/p foley catheter, voiding trial today.  UA and urine cultures are negative.     DVT prophylaxis: (Lovenox) Code Status: DNR Family Communication: Discussed the plan with daughter at bedside.  Disposition:   Status is: Observation The patient will require care spanning > 2 midnights and should be moved to inpatient because:  Currently waiting for SNF to take her back.  Once she voids she is good to go to SNF.    Level of care: Telemetry Consultants:  None.   Procedures: none.   Antimicrobials: IV flagyl. IV rocephin.    Subjective: No nausea, vomiting or abdominal pain  Objective: Vitals:   11/28/21 1946 11/29/21 0610 11/29/21 0752 11/29/21 1342  BP: (!) 114/53 (!) 122/49 113/70 (!) 125/45  Pulse: 69 67 64 66  Resp: 16 14 18 18   Temp: 98 F (36.7 C) 98 F (36.7 C) 98.7 F (37.1 C) 97.8 F (36.6 C)  TempSrc: Oral Oral Oral Oral  SpO2: 98% 97% 95% 99%  Weight:      Height:        Intake/Output Summary (Last 24 hours) at 11/29/2021 1547 Last data filed at 11/29/2021 1024 Gross per 24 hour  Intake 376 ml  Output 1100 ml  Net -724 ml    Filed Weights   11/27/21 0419  Weight: 43 kg    Examination:  General exam: Appears calm and comfortable  Respiratory system: Clear to auscultation. Respiratory effort normal. Cardiovascular system: S1 & S2 heard, RRR. No JVD,  No pedal edema. Gastrointestinal system: Abdomen is nondistended, soft and nontender.  Normal bowel sounds heard. Central nervous system: Alert and oriented. No focal neurological deficits. Extremities: Symmetric 5 x 5 power. Skin: No rashes, lesions or ulcers Psychiatry: Mood & affect appropriate.      Data Reviewed: I have personally reviewed following labs  and imaging studies  CBC: Recent Labs  Lab 11/27/21 0435 11/27/21 1725 11/28/21 0554 11/29/21 0703  WBC 3.1*  --  2.9* 3.1*  NEUTROABS  --   --   --  1.8  HGB 7.4* 7.6* 7.8* 9.7*  HCT 23.5* 23.8* 24.3* 30.6*  MCV 99.6  --  97.2 92.7  PLT 63*  --  45* 40*     Basic Metabolic Panel: Recent Labs  Lab 11/27/21 0435 11/28/21 0554  NA 132* 135  K 5.2* 4.6  CL 101 107  CO2 21* 24  GLUCOSE 109* 96  BUN 37* 26*  CREATININE 1.07* 0.94  CALCIUM 9.1 8.3*     GFR: Estimated Creatinine Clearance: 27.5 mL/min (by C-G formula based on SCr of 0.94 mg/dL).  Liver Function Tests: Recent Labs  Lab 11/27/21 0435  AST 25  ALT 19  ALKPHOS 139*  BILITOT 1.0  PROT 6.7  ALBUMIN 4.1     CBG: No results for input(s): "GLUCAP" in the last 168 hours.   Recent Results (from the past 240 hour(s))  SARS Coronavirus 2 by RT PCR (hospital order, performed in [facility] hospital lab) *cepheid single result test* Anterior Nasal Swab     Status: None   Collection Time: 11/27/21  4:43 AM   Specimen: Anterior Nasal Swab  Result Value Ref Range Status   SARS Coronavirus 2 by RT PCR NEGATIVE NEGATIVE Final    Comment: (NOTE) SARS-CoV-2 target nucleic acids are NOT DETECTED.  The SARS-CoV-2 RNA is generally detectable in upper and lower respiratory specimens during the acute phase of infection. The lowest concentration of SARS-CoV-2 viral copies this assay can detect is 250 copies / mL. A negative result does not preclude SARS-CoV-2 infection and should not be used as the sole basis for treatment or other patient management decisions.  A negative result may occur with improper specimen collection / handling, submission of specimen other than nasopharyngeal swab, presence of viral mutation(s) within the areas targeted by this assay, and inadequate number of viral copies (<250 copies / mL). A negative result must be combined with clinical observations, patient history, and  epidemiological information.  Fact Sheet for Patients:   https://www.patel.info/  Fact Sheet for Healthcare Providers: https://hall.com/  This test is not yet approved or  cleared by the [place] FDA and has been authorized for detection and/or diagnosis of SARS-CoV-2 by FDA under an Emergency Use Authorization (EUA).  This EUA will remain in effect (meaning this test can be used) for the duration of the COVID-19 declaration under Section 564(b)(1) of the Act, 21 U.S.C. section 360bbb-3(b)(1), unless the authorization is terminated or revoked sooner.  Performed at [facility], [place] [address] [place] [address] [place], [place] [address]   Blood culture (routine x 2)     Status: None (Preliminary result)   Collection Time: 11/27/21  7:06 AM   Specimen: BLOOD  Result Value Ref Range Status   Specimen Description   Final  BLOOD LEFT ANTECUBITAL Performed at [place] [address] [place], [place] [address]    Special Requests   Final    BOTTLES DRAWN AEROBIC AND ANAEROBIC Blood Culture results may not be optimal due to an excessive volume of blood received in culture bottles Performed at [place] [address] [place], [place] [address]    Culture   Final    NO GROWTH 2 DAYS Performed at [place] [address] [place], [place] [address]    Report Status PENDING  Incomplete  Blood culture (routine x 2)     Status: None (Preliminary result)   Collection Time: 11/27/21  7:06 AM   Specimen: BLOOD  Result Value Ref Range Status   Specimen Description   Final    BLOOD RIGHT ANTECUBITAL Performed at [facility] [address] [place], [place] [address]    Special Requests   Final    BOTTLES DRAWN AEROBIC AND ANAEROBIC Blood Culture results may not be optimal due to an excessive volume of blood received in culture bottles Performed at [place] [address], [place], [place] [address]    Culture   Final    NO GROWTH 2 DAYS Performed at [facility] [address], [place], [place] [address]    Report Status PENDING  Incomplete  Urine Culture     Status: None   Collection Time: 11/27/21  7:06 AM   Specimen: Urine, Clean Catch  Result Value Ref Range Status   Specimen Description   Final    URINE, CLEAN CATCH Performed at [facility], [place] [address] [place], [place] [address]    Special Requests   Final    Normal Performed at [facility], [place] [address], [place], [place] [address]    Culture   Final    NO GROWTH Performed at [facility], [place] [address] [place], [place] [address]    Report Status 11/28/2021 FINAL  Final         Radiology Studies: No results found.      Scheduled Meds:  sodium chloride   Intravenous Once   atorvastatin  80 mg Oral Daily   calcium-vitamin D  1 tablet Oral BID WC   Chlorhexidine Gluconate Cloth  6 each Topical Daily   feeding supplement  237 mL Oral QPC lunch   levETIRAcetam  250 mg Oral BID   memantine  5 mg Oral BID   midodrine  5 mg Oral TID WC   pantoprazole  20 mg Oral BID   Continuous Infusions:     LOS: 0 days       Hosie Poisson, MD [facility]   To contact the attending provider between 7A-7P or the covering provider during after hours 7P-7A, please log into the web site [email or web address] and access using universal [place] password for that web site. If you do not have the password, please call the hospital operator.  11/29/2021, 3:47 PM

## 2021-11-29 NOTE — Progress Notes (Signed)
I&O cath was performed as MD ordered. Urine output= 565m. Patient was tolerance well with procedure. Bladder scan was done and 041mresidue documented.

## 2021-11-29 NOTE — Evaluation (Signed)
Physical Therapy Evaluation Patient Details Name: Sarah Hampton MRN: [id number] DOB: [birth year] Today's Date: 11/29/2021  History of Present Illness  Pt is an 88yo female presenting from her nursing facility on 11/27/21 due to several episodes of nausea/emesis an hour prior to arrival associated with malaise and several episodes of diarrhea over the past weekend. CTabdomen showed constipation, weight loss, scoliosis and degenerative change lumbar spine. Received 1unit PRBC 9/8. PMH: OA, shoulder melanoma, squamous cell carcinoma, TIA, HTN, macrocytic anemia, HLD, and seizure disorder.   Clinical Impression  Pt admitted with above diagnosis with functional limitations due to the deficits listed below (see PT Problem List). Pt required min guard for transfers and ambulation in hallway with rollator. Pt requested PT evaluate her R hand secondary to arthritis, encouraged pt to perform gentle AROM to tolerance. Pt is at her baseline and was receiving HHPT at her ALF prior to admission, recommend she resume upon discharge. Patient evaluated by Physical Therapy with no further acute PT needs identified. All education has been completed and the patient has no further questions. PT is signing off. Thank you for this referral.        Recommendations for follow up therapy are one component of a multi-disciplinary discharge planning process, led by the attending physician.  Recommendations may be updated based on patient status, additional functional criteria and insurance authorization.  Follow Up Recommendations Home health PT (Resume HHPT at ALF)      Assistance Recommended at Discharge Frequent or constant Supervision/Assistance  Patient can return home with the following  A little help with walking and/or transfers;A little help with bathing/dressing/bathroom;Assistance with cooking/housework;Direct supervision/assist for medications management;Help with stairs or ramp for entrance;Assist for  transportation;Direct supervision/assist for financial management    Equipment Recommendations None recommended by PT  Recommendations for Other Services       Functional Status Assessment Patient has not had a recent decline in their functional status     Precautions / Restrictions Precautions Precautions: Fall Restrictions Weight Bearing Restrictions: No      Mobility  Bed Mobility               General bed mobility comments: Pt OOB in recliner at entry and exit    Transfers Overall transfer level: Needs assistance Equipment used: Rollator (4 wheels) Transfers: Sit to/from Stand Sit to Stand: Min guard           General transfer comment: Min guard for safety, no physical assist required    Ambulation/Gait Ambulation/Gait assistance: Min guard Gait Distance (Feet): 120 Feet Assistive device: Rollator (4 wheels) Gait Pattern/deviations: Step-through pattern, Decreased step length - right, Decreased step length - left, Decreased dorsiflexion - right, Decreased dorsiflexion - left, Narrow base of support Gait velocity: decreased     General Gait Details: Pt ambulated with rollator and min guard, no overt LOB noted or physical assist required. Pt demonstrated decreased DF and foot clearance, able to adjust with verbal cuing. During backwards ambulation pt required physical cuing to increase step length.  Stairs            Wheelchair Mobility    Modified Rankin (Stroke Patients Only)       Balance Overall balance assessment: Needs assistance Sitting-balance support: Feet supported Sitting balance-Leahy Scale: Good     Standing balance support: During functional activity, Reliant on assistive device for balance Standing balance-Leahy Scale: Poor  Pertinent Vitals/Pain Pain Assessment Pain Assessment: No/denies pain    Home Living Family/patient expects to be discharged to:: Assisted living                  Home Equipment: Rollator (4 wheels);Grab bars - tub/shower;Toilet riser Additional Comments: Per daught, pt lives at ALF, uses rollator at baseline, aides help with bathing, pt prefers to eat in her room and is receiving HHPT/OT at ALF.    Prior Function Prior Level of Function : Independent/Modified Independent             Mobility Comments: uses rollator ADLs Comments: Aides help with bating     Hand Dominance   Dominant Hand: Right    Extremity/Trunk Assessment   Upper Extremity Assessment Upper Extremity Assessment: Defer to OT evaluation;RUE deficits/detail RUE Deficits / Details: Pt requested PT examine R hand, pt demonstrates arthritic joints especially PIP and DIP with radial drift, lacking in extension in all joints. PT performed very light PROM exercises and provided hot pack. RUE Sensation: WNL    Lower Extremity Assessment Lower Extremity Assessment: RLE deficits/detail;LLE deficits/detail;Generalized weakness RLE Deficits / Details: ROM reduced but still functional grossly. MMT grossly 3+/5 RLE Sensation: WNL LLE Deficits / Details: ROM reduced but still functional grossly. MMT grossly 3+/5 LLE Sensation: WNL    Cervical / Trunk Assessment Cervical / Trunk Assessment: Kyphotic  Communication   Communication: No difficulties  Cognition Arousal/Alertness: Awake/alert Behavior During Therapy: WFL for tasks assessed/performed Overall Cognitive Status: History of cognitive impairments - at baseline                                 General Comments: Pt has dementia, per daughter is at her baseline        General Comments General comments (skin integrity, edema, etc.): Daughter debbie present    Exercises     Assessment/Plan    PT Assessment All further PT needs can be met in the next venue of care  PT Problem List Decreased strength;Decreased range of motion;Decreased activity tolerance;Decreased balance;Decreased  mobility;Decreased coordination       PT Treatment Interventions      PT Goals (Current goals can be found in the Care Plan section)       Frequency       Co-evaluation               AM-PAC PT "6 Clicks" Mobility  Outcome Measure Help needed turning from your back to your side while in a flat bed without using bedrails?: A Little Help needed moving from lying on your back to sitting on the side of a flat bed without using bedrails?: A Little Help needed moving to and from a bed to a chair (including a wheelchair)?: A Little Help needed standing up from a chair using your arms (e.g., wheelchair or bedside chair)?: A Little Help needed to walk in hospital room?: A Little Help needed climbing 3-5 steps with a railing? : A Lot 6 Click Score: 17    End of Session Equipment Utilized During Treatment: Gait belt Activity Tolerance: Patient tolerated treatment well;No increased pain Patient left: in chair;with call bell/phone within reach;with chair alarm set;with family/visitor present Nurse Communication: Mobility status PT Visit Diagnosis: Unsteadiness on feet (R26.81);Muscle weakness (generalized) (M62.81)    Time: 2774-1287 PT Time Calculation (min) (ACUTE ONLY): 27 min   Charges:   PT Evaluation $PT Eval Low Complexity: 1 Low  PT Treatments $Gait Training: 8-22 mins        Coolidge Breeze, PT, DPT [facility] Department Office: [phone]  Coolidge Breeze 11/29/2021, 12:32 PM

## 2021-11-30 DIAGNOSIS — Z87898 Personal history of other specified conditions: Secondary | ICD-10-CM | POA: Diagnosis not present

## 2021-11-30 DIAGNOSIS — R112 Nausea with vomiting, unspecified: Secondary | ICD-10-CM | POA: Diagnosis not present

## 2021-11-30 DIAGNOSIS — E875 Hyperkalemia: Secondary | ICD-10-CM | POA: Diagnosis not present

## 2021-11-30 DIAGNOSIS — E860 Dehydration: Secondary | ICD-10-CM | POA: Diagnosis not present

## 2021-11-30 DIAGNOSIS — I10 Essential (primary) hypertension: Secondary | ICD-10-CM | POA: Diagnosis not present

## 2021-11-30 DIAGNOSIS — R338 Other retention of urine: Secondary | ICD-10-CM | POA: Diagnosis not present

## 2021-11-30 DIAGNOSIS — R778 Other specified abnormalities of plasma proteins: Secondary | ICD-10-CM | POA: Diagnosis not present

## 2021-11-30 DIAGNOSIS — D61818 Other pancytopenia: Secondary | ICD-10-CM | POA: Diagnosis not present

## 2021-11-30 DIAGNOSIS — K2970 Gastritis, unspecified, without bleeding: Secondary | ICD-10-CM | POA: Diagnosis not present

## 2021-11-30 LAB — CBC WITH DIFFERENTIAL/PLATELET
Abs Immature Granulocytes: 0.36 10*3/uL — ABNORMAL HIGH (ref 0.00–0.07)
Basophils Absolute: 0.1 10*3/uL (ref 0.0–0.1)
Basophils Relative: 2 %
Eosinophils Absolute: 0.1 10*3/uL (ref 0.0–0.5)
Eosinophils Relative: 2 %
HCT: 33.2 % — ABNORMAL LOW (ref 36.0–46.0)
Hemoglobin: 10.5 g/dL — ABNORMAL LOW (ref 12.0–15.0)
Immature Granulocytes: 10 %
Lymphocytes Relative: 27 %
Lymphs Abs: 1 10*3/uL (ref 0.7–4.0)
MCH: 29.3 pg (ref 26.0–34.0)
MCHC: 31.6 g/dL (ref 30.0–36.0)
MCV: 92.7 fL (ref 80.0–100.0)
Monocytes Absolute: 0.4 10*3/uL (ref 0.1–1.0)
Monocytes Relative: 10 %
Neutro Abs: 1.8 10*3/uL (ref 1.7–7.7)
Neutrophils Relative %: 49 %
Platelets: 44 10*3/uL — ABNORMAL LOW (ref 150–400)
RBC: 3.58 MIL/uL — ABNORMAL LOW (ref 3.87–5.11)
RDW: 20.9 % — ABNORMAL HIGH (ref 11.5–15.5)
WBC: 3.7 10*3/uL — ABNORMAL LOW (ref 4.0–10.5)
nRBC: 0.8 % — ABNORMAL HIGH (ref 0.0–0.2)

## 2021-11-30 MED ORDER — PANTOPRAZOLE SODIUM 20 MG PO TBEC: 20.0000 mg | DELAYED_RELEASE_TABLET | Freq: Two times a day (BID) | ORAL | 1 refills | Status: DC

## 2021-11-30 MED ORDER — CLOPIDOGREL BISULFATE 75 MG PO TABS: 75.0000 mg | ORAL_TABLET | Freq: Every day | ORAL | Status: DC

## 2021-11-30 NOTE — Discharge Summary (Addendum)
Physician Discharge Summary   Patient: Sarah Hampton MRN: [id number] DOB: Apr 16, 1930  Admit date:     11/27/2021  Discharge date: 11/30/21  Discharge Physician: Hosie Poisson   PCP: Lauree Chandler, NP   Recommendations at discharge:  Please check CBC and BMP on Friday and if platelet count improves can restart the plavix.  Please follow up with PCP in one week.  Please follow up with oncology as scheduled.  Please check Blood Pressure prior to giving her lisinopril and to hold it if SBP is less than 115mhg.    Discharge Diagnoses: Principal Problem:   Nausea & vomiting Active Problems:   Essential hypertension   Other hyperlipidemia   History of seizure   Hyponatremia   Hyperkalemia   P[place]topenia ([place]   Gastritis   Hospital Course: PBEV DRENNENis a 88y.o. female with medical history significant of osteoarthritis, shoulder melanoma, squamous cell carcinoma, TIA, hypertension, macrocytic anemia, hyperlipidemia, seizure disorder who was brought from her nursing facility due to several episodes of nausea/emesis an hour prior to arrival associated with malaise and several episodes of diarrhea over the past weekend.  She gets frequent indigestion and heartburn. She was admitted for persistent nausea, vomiting and diarrhea .  Patient seen and examined, she reports all her symptoms have improved. She is able to tolerate soft diet without diarrhea.    Assessment and Plan:     Nausea, vomiting and diarrhea:  Appears to have resolved. Probably viral gastroenteritis vs dehydration. She denies any vomiting and abdominal pain.  Afebrile and wbc count is improving.  Continue with PPI for gastritis.  D/c antibiotics, she appears non toxic, able to tolerate soft diet without any symptoms.    Hypertension:  BP parameters are optimal. she can go back on her home dose of lisinopril. Daughter has voiced concern that patient remains dehydrated and does n't drink water and her BP  medication dose has been changed in the past due to her BP.  Please check BP prior to administering the lisinopril and hold the lisinopril if her MAP is less than 65 or systolic BP is less than 1625mmhg.   Myelofibrosis:  S/p recent bone marrow biopsy.  - transfuse to keep hemoglobin greater than 7, s/p 2 units of PRBC transfusion. Hemoglobin improved to 9.  - platelet count at 40,000, baseline platelet count at 50 to 70 k.  - transfuse platelets when less than 20,000.        Hyponatremia:  Resolved with hydration.        Hyperkalemia:  Resolved with hydration.      Gastritis:  On PPI.      Elevated troponin from demand ischemia from dehydration.  She currently denies chest pain .  EKG shows Sinus rhythm Nonspecific intraventricular conduction delay Anteroseptal infarct, old.   H/o TIA's Was on plavix, which  we will hold for thrombocytopenia. Recheck CBC on Friday and restart the plavix when platelets are at baseline.      Seizures:  Continue with keppra.      Hyperlipidemia:  Resume lipitor.    Acute urinary retention:  S/p foley catheter, voiding trial successful.  UA and urine cultures are negative.          Consultants: none.  Procedures performed: none.  Disposition: Assisted living Diet recommendation:  Discharge Diet Orders (From admission, onward)     Start     Ordered   11/30/21 0000  Diet - low sodium heart healthy  11/30/21 1336           Regular diet DISCHARGE MEDICATION: Allergies as of 11/30/2021   No Known Allergies      Medication List     STOP taking these medications    fluticasone 50 MCG/ACT nasal spray Commonly known as: FLONASE       TAKE these medications    acetaminophen 325 MG tablet Commonly known as: TYLENOL Take 650 mg by mouth every 6 (six) hours as needed for moderate pain or mild pain.   atorvastatin 80 MG tablet Commonly known as: LIPITOR TAKE 1 TABLET EVERY DAY  AT  6PM What changed: See  the new instructions.   Calcium Carbonate-Vitamin D 600-400 MG-UNIT tablet Take 1 tablet by mouth 2 (two) times daily.   clopidogrel 75 MG tablet Commonly known as: PLAVIX Take 1 tablet (75 mg total) by mouth daily. Start taking on: December 05, 2021 What changed: These instructions start on December 05, 2021. If you are unsure what to do until then, ask your doctor or other care provider.   cyanocobalamin 1000 MCG tablet Commonly known as: VITAMIN B12 Take 1 tablet (1,000 mcg total) by mouth daily.   Ensure Take 237 mLs by mouth daily in the afternoon. After lunch   feeding supplement Liqd DRINK 1-CAN (237MLS) BY MOUTH AFTER LUNCH   levETIRAcetam 250 MG tablet Commonly known as: Keppra Take 1 tablet (250 mg total) by mouth 2 (two) times daily.   lisinopril 10 MG tablet Commonly known as: ZESTRIL Take 10 mg by mouth daily.   loperamide 2 MG capsule Commonly known as: IMODIUM Take 4 mg by mouth as needed for diarrhea or loose stools.   memantine 5 MG tablet Commonly known as: Namenda Take 1 tablet (5 mg total) by mouth 2 (two) times daily.   pantoprazole 20 MG tablet Commonly known as: PROTONIX Take 1 tablet (20 mg total) by mouth 2 (two) times daily.        Discharge Exam: Filed Weights   11/27/21 0419  Weight: 43 kg   General exam: Appears calm and comfortable  Respiratory system: Clear to auscultation. Respiratory effort normal. Cardiovascular system: S1 & S2 heard, RRR. No JVD, murmurs, rubs, gallops or clicks. No pedal edema. Gastrointestinal system: Abdomen is nondistended, soft and nontender. No organomegaly or masses felt. Normal bowel sounds heard. Central nervous system: Alert and oriented. No focal neurological deficits. Extremities: Symmetric 5 x 5 power. Skin: No rashes, lesions or ulcers Psychiatry: Judgement and insight appear normal. Mood & affect appropriate.    Condition at discharge: fair  The results of significant diagnostics from  this hospitalization (including imaging, microbiology, ancillary and laboratory) are listed below for reference.   Imaging Studies: CT ABDOMEN PELVIS W CONTRAST  Result Date: 11/27/2021 CLINICAL DATA:  Abdominal pain. EXAM: CT ABDOMEN AND PELVIS WITH CONTRAST TECHNIQUE: Multidetector CT imaging of the abdomen and pelvis was performed using the standard protocol following bolus administration of intravenous contrast. RADIATION DOSE REDUCTION: This exam was performed according to the departmental dose-optimization program which includes automated exposure control, adjustment of the mA and/or kV according to patient size and/or use of iterative reconstruction technique. CONTRAST:  33m OMNIPAQUE IOHEXOL 300 MG/ML  SOLN COMPARISON:  Only prior study is CT abdomen and pelvis with contrast 10/22/2021 FINDINGS: Lower chest: There is diffuse bronchial thickening in the lower lobes. In the posterior bases there are scattered small bronchiolar impactions consistent with small airways disease. This was seen previously and no bronchopneumonia is evident.  There are linear atelectatic foci. The heart is borderline prominent. There are three-vessel coronary artery calcifications with heavy calcification in the mitral ring. There is no pericardial effusion. Mild-to-moderate elevation right hemidiaphragm. Hepatobiliary: Interval new demonstration of a mild intrahepatic periportal edema which is most likely due to fluid overload, less likely hepatitis. Unremarkable gallbladder and bile ducts. Otherwise unremarkable liver parenchyma. Spleen: Mildly prominent, unchanged measuring 14.4 cm in length. There is a calcified granuloma posteriorly. No mass enhancement. Pancreas: No focal abnormality. Adrenals/Urinary Tract: There is no adrenal mass. There is mild bilateral hydronephrosis which is probably due to the distended bladder with the dome reaching as high as L5-S1. No wall thickening is seen in the bladder and no urinary stone is  evident with renovascular calcifications along both renal hila. There is no renal cortical mass. There is symmetric excretion in the delayed phase Stomach/Bowel: The gastric wall is contracted. There are at least mildly thickened folds of mid to distal stomach. The unopacified small bowel is unremarkable. The appendix is normal caliber. There is mild fecal stasis. Left-sided diverticulosis without evidence of diverticulitis. Vascular/Lymphatic: There is heavy aortoiliac calcific plaque, branch vessel atherosclerosis without AAA. No adenopathy is seen. Reproductive: No mass or adnexal abnormality is suspected. There are pelvic phleboliths. Other: There is interval weight loss and decreased thickness of the body fat. There is no free fluid, free air or hemorrhage. There is no incarcerated hernia. Musculoskeletal: There is moderate lumbar dextroscoliosis and advanced degenerative change, moderate hip DJD right-greater-than-left. Osseous structures are dense and heterogeneous throughout, which was seen previously. No destructive lesion is evident. IMPRESSION: 1. Thickened folds in the stomach.  Probable gastritis. 2. Bilateral lower lobe small airways disease with scattered small bronchiolar impactions. No bronchopneumonia. 3. Constipation without bowel obstruction. 4. Intrahepatic periportal edema most likely due to fluid overload, less likely hepatitis. 5. Interval weight loss since 10/22/2021 and decreased thickness of the body fat. 6. Distended bladder with mild hydronephrosis likely due to this. No obstructing stone. 7. Scoliosis and degenerative change lumbar spine. 8. Diffuse heterogeneity and increased density of the bones, which could relate to renal osteodystrophy or a marrow replacement disorder. Further evaluation recommended. Electronically Signed   By: Telford Nab M.D.   On: 11/27/2021 06:58   DG Chest Portable 1 View  Result Date: 11/27/2021 CLINICAL DATA:  Cough EXAM: PORTABLE CHEST 1 VIEW  COMPARISON:  10/22/2021 FINDINGS: Normal heart size and mediastinal contours. No acute infiltrate or edema. No effusion or pneumothorax. Generalized osteopenia and interstitial coarsening, recent bone marrow biopsy. IMPRESSION: Stable from prior.  No acute finding. Electronically Signed   By: Jorje Guild M.D.   On: 11/27/2021 06:11   CT BONE MARROW BIOPSY & ASPIRATION  Result Date: 11/03/2021 INDICATION: Thrombocytopenia EXAM: CT GUIDED BONE MARROW ASPIRATION AND CORE BIOPSY MEDICATIONS: None. ANESTHESIA/SEDATION: Moderate (conscious) sedation was employed during this procedure. A total of Versed 0.25 mg and Fentanyl 25 mcg was administered intravenously. Moderate Sedation Time: 20 minutes. The patient's level of consciousness and vital signs were monitored continuously by radiology nursing throughout the procedure under my direct supervision. FLUOROSCOPY TIME:  CT dose in mGy was not provided. COMPLICATIONS: None immediate. Estimated blood loss: <5 mL PROCEDURE: RADIATION DOSE REDUCTION: This exam was performed according to the departmental dose-optimization program which includes automated exposure control, adjustment of the mA and/or kV according to patient size and/or use of iterative reconstruction technique. Informed written consent was obtained from the the patient and/or patient's representative after a thorough discussion of the  procedural risks, benefits and alternatives. All questions were addressed. Maximal Sterile Barrier Technique was utilized including caps, mask, sterile gowns, sterile gloves, sterile drape, hand hygiene and skin antiseptic. A timeout was performed prior to the initiation of the procedure. The patient was positioned prone and non-contrast localization CT was performed of the pelvis to demonstrate the iliac marrow spaces. Maximal barrier sterile technique utilized including caps, mask, sterile gowns, sterile gloves, large sterile drape, hand hygiene, and chlorhexidine prep.  Under sterile conditions and local anesthesia, an 11 gauge coaxial bone biopsy needle was advanced into the RIGHT iliac marrow space. Needle position was confirmed with CT imaging. Initially, bone marrow aspiration was performed. Next, the 11 gauge outer cannula was utilized to obtain a 2 iliac bone marrow core biopsy. Needle was removed. Hemostasis was obtained with compression. The patient tolerated the procedure well. Samples were prepared with the cytotechnologist. IMPRESSION: Successful CT-guided bone marrow aspiration and biopsy, as above. Michaelle Birks, MD [facility] Electronically Signed   By: Michaelle Birks M.D.   On: 11/03/2021 10:51    Microbiology: Results for orders placed or performed during the hospital encounter of 11/27/21  SARS Coronavirus 2 by RT PCR (hospital order, performed in [facility] hospital lab) *cepheid single result test* Anterior Nasal Swab     Status: None   Collection Time: 11/27/21  4:43 AM   Specimen: Anterior Nasal Swab  Result Value Ref Range Status   SARS Coronavirus 2 by RT PCR NEGATIVE NEGATIVE Final    Comment: (NOTE) SARS-CoV-2 target nucleic acids are NOT DETECTED.  The SARS-CoV-2 RNA is generally detectable in upper and lower respiratory specimens during the acute phase of infection. The lowest concentration of SARS-CoV-2 viral copies this assay can detect is 250 copies / mL. A negative result does not preclude SARS-CoV-2 infection and should not be used as the sole basis for treatment or other patient management decisions.  A negative result may occur with improper specimen collection / handling, submission of specimen other than nasopharyngeal swab, presence of viral mutation(s) within the areas targeted by this assay, and inadequate number of viral copies (<250 copies / mL). A negative result must be combined with clinical observations, patient history, and epidemiological  information.  Fact Sheet for Patients:   https://www.patel.info/  Fact Sheet for Healthcare Providers: https://hall.com/  This test is not yet approved or  cleared by the [place] FDA and has been authorized for detection and/or diagnosis of SARS-CoV-2 by FDA under an Emergency Use Authorization (EUA).  This EUA will remain in effect (meaning this test can be used) for the duration of the COVID-19 declaration under Section 564(b)(1) of the Act, 21 U.S.C. section 360bbb-3(b)(1), unless the authorization is terminated or revoked sooner.  Performed at [facility], [place] [address], [place], [place] [address]   Blood culture (routine x 2)     Status: None (Preliminary result)   Collection Time: 11/27/21  7:06 AM   Specimen: BLOOD  Result Value Ref Range Status   Specimen Description   Final    BLOOD LEFT ANTECUBITAL Performed at [facility] [address], [place], [place] [address]    Special Requests   Final    BOTTLES DRAWN AEROBIC AND ANAEROBIC Blood Culture results may not be optimal due to an excessive volume of blood received in culture bottles Performed at [facility] [address], [place], [place] [address]    Culture   Final  NO GROWTH 3 DAYS Performed at [facility], [place] [address] [place], [place] [address]    Report Status PENDING  Incomplete  Blood culture (routine x 2)     Status: None (Preliminary result)   Collection Time: 11/27/21  7:06 AM   Specimen: BLOOD  Result Value Ref Range Status   Specimen Description   Final    BLOOD RIGHT ANTECUBITAL Performed at [facility] [address], [place], [place] [address]    Special Requests   Final    BOTTLES DRAWN AEROBIC AND ANAEROBIC Blood Culture results may not be optimal due to an excessive volume of blood received in culture bottles Performed at [facility] [address], [place], [place] [address]    Culture   Final    NO GROWTH 3 DAYS Performed at [facility], [place] [address] [place], [place] [address]    Report Status PENDING  Incomplete  Urine Culture     Status: None   Collection Time: 11/27/21  7:06 AM   Specimen: Urine, Clean Catch  Result Value Ref Range Status   Specimen Description   Final    URINE, CLEAN CATCH Performed at [facility], [place] [address], [place], [place] [address]    Special Requests   Final    Normal Performed at [facility], [place] [address] [place], [place] [address]    Culture   Final    NO GROWTH Performed at [facility], [place] [address], [place], [place] [address]    Report Status 11/28/2021 FINAL  Final    Labs: CBC: Recent Labs  Lab 11/27/21 0435 11/27/21 1725 11/28/21 0554 11/29/21 0703 11/30/21 0555  WBC 3.1*  --  2.9* 3.1* 3.7*  NEUTROABS  --   --   --  1.8 1.8  HGB 7.4* 7.6* 7.8* 9.7* 10.5*  HCT 23.5* 23.8* 24.3* 30.6* 33.2*  MCV 99.6  --  97.2 92.7 92.7  PLT 63*  --  45* 40* 44*   Basic Metabolic Panel: Recent Labs  Lab 11/27/21 0435 11/28/21 0554  NA 132* 135  K 5.2* 4.6  CL 101 107  CO2 21* 24  GLUCOSE 109* 96  BUN 37* 26*  CREATININE 1.07* 0.94  CALCIUM 9.1 8.3*   Liver Function Tests: Recent Labs  Lab 11/27/21 0435  AST 25  ALT 19  ALKPHOS 139*  BILITOT 1.0  PROT 6.7  ALBUMIN 4.1   CBG: No results for input(s): "GLUCAP" in the last 168 hours.  Discharge time spent: 42 minutes.   Signed: Hosie Poisson, MD [facility] 11/30/2021

## 2021-11-30 NOTE — Progress Notes (Signed)
Mobility Specialist - Progress Note   11/30/21 1212  Mobility  Activity Repositioned in chair   Pt received in recliner and asked for assistance to reposition in recliner. Pt did not want to ambulate at this moment.  Pt in recliner w/ call beach in reach.   [facility]

## 2021-11-30 NOTE — Plan of Care (Signed)

## 2021-11-30 NOTE — TOC Transition Note (Addendum)
Transition of Care [facility]) - CM/SW Discharge Note   Patient Details  Name: Sarah Hampton MRN: [id number] Date of Birth: Aug 07, 1930  [facility]) CM/SW Contact:  Ross Ludwig, LCSW Phone Number: 11/30/2021, 2:04 PM   Clinical Narrative:     CSW called [facility] and spoke to [facility]' to see if patient can return today.  Per med tech, patient can return today, they will need a copy of the DC summary, and FL2 faxed to [phone].  CSW faxed requested information to ALF.  CSW spoke to Patient's daughter [name], and she said they will transport patient back to her ALF.  Patient to be d/c'ed today to [facility].  Patient and family agreeable to plans will transport via daughter's car.  Alvis Lemmings is contracted for [place] at facilty, CSW spoke to [place], and he can acccept patient for [facility] PT.  Patient does not need any other equipment.  [facility] to accept patient back today.  5:38pm  CSW received phone call from attending physician that she had to addend the DC summary for patient's return back to [facility] and the updated DC summary will have to be faxed to ALF.    5:53pm  Updated DC summary completed by attending physician and faxed to ALF at [phone].  CSW received confirmation that fax was successful at 5:56pm.                                                                                              Final next level of care: Assisted Living Barriers to Discharge: Barriers Resolved   Patient Goals and CMS Choice Patient states their goals for this hospitalization and ongoing recovery are:: To return back to ALF CMS Medicare.gov Compare Post Acute Care list provided to:: Patient Represenative (must comment) Choice offered to / list presented to : Adult Children  Discharge Placement                Patient to be transferred to facility by: Family will transport Name of family member notified: Daughter [name] [phone] Patient and family  notified of of transfer: 11/30/21  Discharge Plan and Services In-house Referral: NA Discharge Planning Services: NA Post Acute Care Choice: Nursing Home          DME Arranged: N/A DME Agency: NA       HH Arranged: PT, OT HH Agency: [place] Date [facility] Agency Contacted: 11/30/21 Time [place]: 1403 Representative spoke with at [place]: Tommi Rumps  Social Determinants of Health ([place]) Interventions     Readmission Risk Interventions     No data to display

## 2021-11-30 NOTE — NC FL2 (Signed)
[place] LEVEL OF CARE SCREENING TOOL     IDENTIFICATION  Patient Name: Sarah Hampton Birthdate: 16-Oct-1930 Sex: female Admission Date (Current Location): 11/27/2021  [facility] and [place] Number:  Herbalist and Address:  [facility],  [address] [place], [place]      Provider Number: [id number]  Attending Physician Name and Address:  Hosie Poisson, MD  Relative Name and Phone Number:  Melynda Ripple) Daughter [phone]  [phone]  Watt,Matt Relative   [phone]    Current Level of Care: Hospital Recommended Level of Care: Assisted Living Facility Prior Approval Number:    Date Approved/Denied:   PASRR Number:    Discharge Plan: Other (Comment) ([facility])    Current Diagnoses: Patient Active Problem List   Diagnosis Date Noted   Nausea & vomiting 11/27/2021   Hyperkalemia 11/27/2021   Pancytopenia ([place]) 11/27/2021   Gastritis 11/27/2021   Myelofibrosis ([place]) 11/20/2021   History of seizure 09/17/2021   Hyperbilirubinemia 09/17/2021   Hyponatremia 09/17/2021   Thrombocytopenia ([place]) 09/16/2021   Symptomatic anemia 09/16/2021   Macrocytic anemia 04/15/2020   Leukocytosis 04/15/2020   History of TIA (transient ischemic attack) 04/15/2020   Other hyperlipidemia 11/12/2017   TIA (transient ischemic attack) 09/28/2017   Essential hypertension 09/28/2017   Chronic kidney disease, stage 3a ([place]) 09/28/2017   Arthritis 08/24/2017   Squamous cell carcinoma 08/24/2017   Widowed 08/24/2017   Personal history of malignant neoplasm of other organs and systems 10/16/2016    Orientation RESPIRATION BLADDER Height & Weight     Self, Time, Situation, Place  Normal Incontinent Weight: 94 lb 12.8 oz (43 kg) Height:  '4\' 11"'$  (149.9 cm)  BEHAVIORAL SYMPTOMS/MOOD NEUROLOGICAL BOWEL NUTRITION STATUS      Continent Diet (Regular)  AMBULATORY STATUS COMMUNICATION OF NEEDS Skin   Supervision Verbally Normal                        Personal Care Assistance Level of Assistance  Bathing, Feeding, Dressing Bathing Assistance: Limited assistance Feeding assistance: Independent Dressing Assistance: Limited assistance     Functional Limitations Info  Sight, Hearing, Speech Sight Info: Adequate Hearing Info: Adequate Speech Info: Adequate    SPECIAL CARE FACTORS FREQUENCY  PT (By licensed PT), OT (By licensed OT)     PT Frequency: Minimum 2x a week OT Frequency: Minimum 2x a week            Contractures Contractures Info: Not present    Additional Factors Info  Code Status, Allergies Code Status Info: DNR Allergies Info: NKA           Current Medications (11/30/2021):  This is the current hospital active medication list Current Facility-Administered Medications  Medication Dose Route Frequency Provider Last Rate Last Admin   0.9 %  sodium chloride infusion (Manually program via Guardrails IV Fluids)   Intravenous Once Reubin Milan, MD   Held at 11/27/21 1042   acetaminophen (TYLENOL) tablet 500 mg  500 mg Oral Q6H PRN Reubin Milan, MD   500 mg at 11/29/21 1100   Or   acetaminophen (TYLENOL) suppository 650 mg  650 mg Rectal Q8H PRN Reubin Milan, MD       atorvastatin (LIPITOR) tablet 80 mg  80 mg Oral Daily Reubin Milan, MD   80 mg at 11/30/21 1130   calcium-vitamin D (OSCAL WITH D) 500-5 MG-MCG per tablet 1 tablet  1 tablet Oral  BID WC Reubin Milan, MD   1 tablet at 11/30/21 2585   feeding supplement (ENSURE ENLIVE / ENSURE PLUS) liquid 237 mL  237 mL Oral QPC lunch Reubin Milan, MD       levETIRAcetam (KEPPRA) tablet 250 mg  250 mg Oral BID Reubin Milan, MD   250 mg at 11/30/21 1130   memantine (NAMENDA) tablet 5 mg  5 mg Oral BID Reubin Milan, MD   5 mg at 11/30/21 1129   midodrine (PROAMATINE) tablet 5 mg  5 mg Oral TID WC Reubin Milan, MD   5 mg at 11/30/21 1130   Muscle Rub CREA   Topical PRN Hosie Poisson, MD    Given at 11/29/21 0909   ondansetron (ZOFRAN) tablet 4 mg  4 mg Oral Q6H PRN Reubin Milan, MD       Or   ondansetron [facility]) injection 4 mg  4 mg Intravenous Q6H PRN Reubin Milan, MD       pantoprazole (PROTONIX) EC tablet 20 mg  20 mg Oral BID Reubin Milan, MD   20 mg at 11/30/21 1130     Discharge Medications: STOP taking these medications     fluticasone 50 MCG/ACT nasal spray Commonly known as: FLONASE           TAKE these medications     acetaminophen 325 MG tablet Commonly known as: TYLENOL Take 650 mg by mouth every 6 (six) hours as needed for moderate pain or mild pain.    atorvastatin 80 MG tablet Commonly known as: LIPITOR TAKE 1 TABLET EVERY DAY  AT  6PM What changed: See the new instructions.    Calcium Carbonate-Vitamin D 600-400 MG-UNIT tablet Take 1 tablet by mouth 2 (two) times daily.    clopidogrel 75 MG tablet Commonly known as: PLAVIX Take 1 tablet (75 mg total) by mouth daily. Start taking on: December 05, 2021 What changed: These instructions start on December 05, 2021. If you are unsure what to do until then, ask your doctor or other care provider.    cyanocobalamin 1000 MCG tablet Commonly known as: VITAMIN B12 Take 1 tablet (1,000 mcg total) by mouth daily.    Ensure Take 237 mLs by mouth daily in the afternoon. After lunch    feeding supplement Liqd DRINK 1-CAN (237MLS) BY MOUTH AFTER LUNCH    levETIRAcetam 250 MG tablet Commonly known as: Keppra Take 1 tablet (250 mg total) by mouth 2 (two) times daily.    lisinopril 10 MG tablet Commonly known as: ZESTRIL Take 10 mg by mouth daily.    loperamide 2 MG capsule Commonly known as: IMODIUM Take 4 mg by mouth as needed for diarrhea or loose stools.    memantine 5 MG tablet Commonly known as: Namenda Take 1 tablet (5 mg total) by mouth 2 (two) times daily.    pantoprazole 20 MG tablet Commonly known as: PROTONIX Take 1 tablet (20 mg total) by mouth 2 (two)  times daily.    Relevant Imaging Results:  Relevant Lab Results:   Additional Information SSN [id number]  Ross Ludwig, LCSW

## 2021-11-30 NOTE — Progress Notes (Signed)
Nurse called report to [facility].  Pt to be transported by her daughter to facility at time of discharge.  Discharge packet given to pt's daughter.  No concerns at time of discharge.

## 2021-12-02 DIAGNOSIS — R2689 Other abnormalities of gait and mobility: Secondary | ICD-10-CM | POA: Diagnosis not present

## 2021-12-02 LAB — CULTURE, BLOOD (ROUTINE X 2)
Culture: NO GROWTH
Culture: NO GROWTH

## 2021-12-03 ENCOUNTER — Encounter: Payer: Self-pay | Admitting: Nurse Practitioner

## 2021-12-03 DIAGNOSIS — R2689 Other abnormalities of gait and mobility: Secondary | ICD-10-CM | POA: Diagnosis not present

## 2021-12-03 NOTE — Progress Notes (Unsigned)
   This service is provided via telemedicine  No vital signs collected/recorded due to the encounter was a telemedicine visit.   Location of patient (ex: home, work):  Home  Patient consents to a telephone visit: Yes, see telephone visit dated 12/04/2021  Location of the provider (ex: office, home):  [place], Remote Location   Name of any referring provider:  N/A  Names of all persons participating in the telemedicine service and their role in the encounter:  S.Chrae B/CMA, Sherrie Mustache, NP, D.J. (daughter), and Patient   Time spent on call:  16 min with medical assistant

## 2021-12-04 ENCOUNTER — Encounter: Payer: Self-pay | Admitting: Nurse Practitioner

## 2021-12-04 ENCOUNTER — Ambulatory Visit (INDEPENDENT_AMBULATORY_CARE_PROVIDER_SITE_OTHER): Payer: Medicare HMO | Admitting: Nurse Practitioner

## 2021-12-04 ENCOUNTER — Encounter: Payer: Medicare HMO | Admitting: Nurse Practitioner

## 2021-12-04 DIAGNOSIS — Z0000 Encounter for general adult medical examination without abnormal findings: Secondary | ICD-10-CM | POA: Diagnosis not present

## 2021-12-04 DIAGNOSIS — Z66 Do not resuscitate: Secondary | ICD-10-CM

## 2021-12-04 DIAGNOSIS — R2689 Other abnormalities of gait and mobility: Secondary | ICD-10-CM | POA: Diagnosis not present

## 2021-12-04 NOTE — Progress Notes (Signed)
   This service is provided via telemedicine  No vital signs collected/recorded due to the encounter was a telemedicine visit.   Location of patient (ex: home, work):  Home  Patient consents to a telephone visit: Yes, see telephone visit dated 12/04/21  Location of the provider (ex: office, home):  [place], Remote Location   Name of any referring provider:  N/A  Names of all persons participating in the telemedicine service and their role in the encounter:  S.Chrae B/CMA, Sherrie Mustache, NP, D.J. (daughter) and Patient   Time spent on call:  16 min with medical assistant

## 2021-12-04 NOTE — Patient Instructions (Signed)
Sarah Hampton , Thank you for taking time to come for your Medicare Wellness Visit. I appreciate your ongoing commitment to your health goals. Please review the following plan we discussed and let me know if I can assist you in the future.   Screening recommendations/referrals: Colonoscopy aged out Mammogram aged out Bone Density up to date Recommended yearly ophthalmology/optometry visit for glaucoma screening and checkup Recommended yearly dental visit for hygiene and checkup  Vaccinations: Influenza vaccine- due annually in September/October Pneumococcal vaccine up to date Tdap vaccine DUE- recommend to get at your local pharmacy Shingles vaccine DUE- recommend to get at your local pharmacy    Advanced directives: on file.   Conditions/risks identified: at risk for nutritional deficit, weight loss, progressive memory loss.   Next appointment: yearly   Preventive Care 70 Years and Older, Female Preventive care refers to lifestyle choices and visits with your health care provider that can promote health and wellness. What does preventive care include? A yearly physical exam. This is also called an annual well check. Dental exams once or twice a year. Routine eye exams. Ask your health care provider how often you should have your eyes checked. Personal lifestyle choices, including: Daily care of your teeth and gums. Regular physical activity. Eating a healthy diet. Avoiding tobacco and drug use. Limiting alcohol use. Practicing safe sex. Taking low-dose aspirin every day. Taking vitamin and mineral supplements as recommended by your health care provider. What happens during an annual well check? The services and screenings done by your health care provider during your annual well check will depend on your age, overall health, lifestyle risk factors, and family history of disease. Counseling  Your health care provider may ask you questions about your: Alcohol use. Tobacco  use. Drug use. Emotional well-being. Home and relationship well-being. Sexual activity. Eating habits. History of falls. Memory and ability to understand (cognition). Work and work Statistician. Reproductive health. Screening  You may have the following tests or measurements: Height, weight, and BMI. Blood pressure. Lipid and cholesterol levels. These may be checked every 5 years, or more frequently if you are over 39 years old. Skin check. Lung cancer screening. You may have this screening every year starting at age 73 if you have a 30-pack-year history of smoking and currently smoke or have quit within the past 15 years. Fecal occult blood test (FOBT) of the stool. You may have this test every year starting at age 8. Flexible sigmoidoscopy or colonoscopy. You may have a sigmoidoscopy every 5 years or a colonoscopy every 10 years starting at age 84. Hepatitis C blood test. Hepatitis B blood test. Sexually transmitted disease (STD) testing. Diabetes screening. This is done by checking your blood sugar (glucose) after you have not eaten for a while (fasting). You may have this done every 1-3 years. Bone density scan. This is done to screen for osteoporosis. You may have this done starting at age 26. Mammogram. This may be done every 1-2 years. Talk to your health care provider about how often you should have regular mammograms. Talk with your health care provider about your test results, treatment options, and if necessary, the need for more tests. Vaccines  Your health care provider may recommend certain vaccines, such as: Influenza vaccine. This is recommended every year. Tetanus, diphtheria, and acellular pertussis (Tdap, Td) vaccine. You may need a Td booster every 10 years. Zoster vaccine. You may need this after age 33. Pneumococcal 13-valent conjugate (PCV13) vaccine. One dose is recommended after age  65. Pneumococcal polysaccharide (PPSV23) vaccine. One dose is recommended  after age 50. Talk to your health care provider about which screenings and vaccines you need and how often you need them. This information is not intended to replace advice given to you by your health care provider. Make sure you discuss any questions you have with your health care provider. Document Released: 04/05/2015 Document Revised: 11/27/2015 Document Reviewed: 01/08/2015 Elsevier Interactive Patient Education  2017 [place] Prevention in the Home Falls can cause injuries. They can happen to people of all ages. There are many things you can do to make your home safe and to help prevent falls. What can I do on the outside of my home? Regularly fix the edges of walkways and driveways and fix any cracks. Remove anything that might make you trip as you walk through a door, such as a raised step or threshold. Trim any bushes or trees on the path to your home. Use bright outdoor lighting. Clear any walking paths of anything that might make someone trip, such as rocks or tools. Regularly check to see if handrails are loose or broken. Make sure that both sides of any steps have handrails. Any raised decks and porches should have guardrails on the edges. Have any leaves, snow, or ice cleared regularly. Use sand or salt on walking paths during winter. Clean up any spills in your garage right away. This includes oil or grease spills. What can I do in the bathroom? Use night lights. Install grab bars by the toilet and in the tub and shower. Do not use towel bars as grab bars. Use non-skid mats or decals in the tub or shower. If you need to sit down in the shower, use a plastic, non-slip stool. Keep the floor dry. Clean up any water that spills on the floor as soon as it happens. Remove soap buildup in the tub or shower regularly. Attach bath mats securely with double-sided non-slip rug tape. Do not have throw rugs and other things on the floor that can make you trip. What can I do  in the bedroom? Use night lights. Make sure that you have a light by your bed that is easy to reach. Do not use any sheets or blankets that are too big for your bed. They should not hang down onto the floor. Have a firm chair that has side arms. You can use this for support while you get dressed. Do not have throw rugs and other things on the floor that can make you trip. What can I do in the kitchen? Clean up any spills right away. Avoid walking on wet floors. Keep items that you use a lot in easy-to-reach places. If you need to reach something above you, use a strong step stool that has a grab bar. Keep electrical cords out of the way. Do not use floor polish or wax that makes floors slippery. If you must use wax, use non-skid floor wax. Do not have throw rugs and other things on the floor that can make you trip. What can I do with my stairs? Do not leave any items on the stairs. Make sure that there are handrails on both sides of the stairs and use them. Fix handrails that are broken or loose. Make sure that handrails are as long as the stairways. Check any carpeting to make sure that it is firmly attached to the stairs. Fix any carpet that is loose or worn. Avoid having throw rugs at  the top or bottom of the stairs. If you do have throw rugs, attach them to the floor with carpet tape. Make sure that you have a light switch at the top of the stairs and the bottom of the stairs. If you do not have them, ask someone to add them for you. What else can I do to help prevent falls? Wear shoes that: Do not have high heels. Have rubber bottoms. Are comfortable and fit you well. Are closed at the toe. Do not wear sandals. If you use a stepladder: Make sure that it is fully opened. Do not climb a closed stepladder. Make sure that both sides of the stepladder are locked into place. Ask someone to hold it for you, if possible. Clearly mark and make sure that you can see: Any grab bars or  handrails. First and last steps. Where the edge of each step is. Use tools that help you move around (mobility aids) if they are needed. These include: Canes. Walkers. Scooters. Crutches. Turn on the lights when you go into a dark area. Replace any light bulbs as soon as they burn out. Set up your furniture so you have a clear path. Avoid moving your furniture around. If any of your floors are uneven, fix them. If there are any pets around you, be aware of where they are. Review your medicines with your doctor. Some medicines can make you feel dizzy. This can increase your chance of falling. Ask your doctor what other things that you can do to help prevent falls. This information is not intended to replace advice given to you by your health care provider. Make sure you discuss any questions you have with your health care provider. Document Released: 01/03/2009 Document Revised: 08/15/2015 Document Reviewed: 04/13/2014 Elsevier Interactive Patient Education  2017 [employer].

## 2021-12-04 NOTE — Progress Notes (Signed)
Subjective:   Sarah Hampton is a 88 y.o. female who presents for Medicare Annual (Subsequent) preventive examination.  Review of Systems           Objective:    There were no vitals filed for this visit. There is no height or weight on file to calculate BMI.     12/04/2021    8:35 AM 12/03/2021   11:29 AM 11/27/2021    4:19 AM 11/03/2021    8:29 AM 10/20/2021   12:31 PM 10/09/2021    2:40 PM 10/08/2021   11:25 AM  Advanced Directives  Does Patient Have a Medical Advance Directive? Yes Yes Yes Yes Yes Yes Yes  Type of Advance Directive Living will;Out of facility DNR (pink MOST or yellow form) Living will;Out of facility DNR (pink MOST or yellow form) Healthcare Power of [place];Living will;Out of facility DNR (pink MOST or yellow form)    Does patient want to make changes to medical advance directive? No - Patient declined No - Patient declined No - Patient declined No - Patient declined No - Patient declined No - Patient declined   Copy of [place] in Chart?    Yes - validated most recent copy scanned in chart (See row information) Yes - validated most recent copy scanned in chart (See row information), Physician notified    Would patient like information on creating a medical advance directive?    No - Patient declined No - Patient declined    Pre-existing out of facility DNR order (yellow form or pink MOST form) Yellow form placed in chart (order not valid for inpatient use);Pink MOST form placed in chart (order not valid for inpatient use) Yellow form placed in chart (order not valid for inpatient use);Pink MOST form placed in chart (order not valid for inpatient use)   Pink MOST/Yellow Form most recent copy in chart - Physician notified to receive inpatient order      Current Medications (verified) Outpatient Encounter Medications as of 12/04/2021  Medication Sig   acetaminophen (TYLENOL) 325 MG tablet Take 650 mg by mouth every  6 (six) hours as needed for moderate pain or mild pain.   atorvastatin (LIPITOR) 80 MG tablet TAKE 1 TABLET EVERY DAY  AT  6PM   Calcium Carbonate-Vitamin D 600-400 MG-UNIT tablet Take 1 tablet by mouth 2 (two) times daily.   feeding supplement (ENSURE ENLIVE / ENSURE PLUS) LIQD DRINK 1-CAN (237MLS) BY MOUTH AFTER LUNCH   levETIRAcetam (KEPPRA) 250 MG tablet Take 1 tablet (250 mg total) by mouth 2 (two) times daily.   lisinopril (ZESTRIL) 10 MG tablet Take 10 mg by mouth daily.   loperamide (IMODIUM) 2 MG capsule Take 4 mg by mouth as needed for diarrhea or loose stools.   memantine (NAMENDA) 5 MG tablet Take 1 tablet (5 mg total) by mouth 2 (two) times daily.   mometasone (NASONEX) 50 MCG/ACT nasal spray Place 2 sprays into the nose as needed. Seasonal- usually in the spring   pantoprazole (PROTONIX) 20 MG tablet Take 1 tablet (20 mg total) by mouth 2 (two) times daily.   vitamin B-12 (CYANOCOBALAMIN) 1000 MCG tablet Take 1 tablet (1,000 mcg total) by mouth daily.   [START ON 12/05/2021] clopidogrel (PLAVIX) 75 MG tablet Take 1 tablet (75 mg total) by mouth daily. (Patient not taking: Reported on 12/04/2021)   No facility-administered encounter medications on file as of 12/04/2021.    Allergies (verified) Patient has no  known allergies.   History: Past Medical History:  Diagnosis Date   Arthritis    History of melanoma    shoulder   History of squamous cell carcinoma    History of TIA (transient ischemic attack)    Hypertension    Macrocytic anemia    Mini stroke    Per [place] New Patient Packet    Other hyperlipidemia    Personal history of malignant neoplasm of other organs and systems    Seizure [facility])    Past Surgical History:  Procedure Laterality Date   CATARACT EXTRACTION     DENTAL SURGERY     SKIN SURGERY  07/2018   SQUAMOUS CELL CARCINOMA EXCISION  09/10/2021   TONSILLECTOMY  1946   Per [place] New Patient Packet    Family History  Problem Relation Age of Onset    Dementia Mother 21   Breast cancer Mother    Pneumonia Father 49   Cancer Father    Thyroid disease Daughter    Thyroid disease Daughter    Stroke Neg Hx    Social History   Socioeconomic History   Marital status: Widowed    Spouse name: Not on file   Number of children: Not on file   Years of education: Not on file   Highest education level: Not on file  Occupational History   Occupation: retired  Tobacco Use   Smoking status: Former    Packs/day: 0.50    Years: 30.00    Total pack years: 15.00    Types: Cigarettes    Quit date: 09/28/1968    Years since quitting: 53.2   Smokeless tobacco: Never  Vaping Use   Vaping Use: Never used  Substance and Sexual Activity   Alcohol use: Not Currently   Drug use: Never   Sexual activity: Not on file  Other Topics Concern   Not on file  Social History Narrative   Per [facility] [place] Patient Packet 01/13/2019       Diet: No answer       Caffeine: Coffee       Married, if yes what year: Widowed, married in [place] you live in a house, apartment, assisted living, condo, trailer, ect: One stories, one person (apartment)       Pets: No      Current/Past profession: Museum/gallery conservator       Exercise:Yes, 5-6 x weekly          Living Will: Yes   DNR: Yes   POA/HPOA: Yes      Functional Status:   Do you have difficulty bathing or dressing yourself?No   Do you have difficulty preparing food or eating? No   Do you have difficulty managing your medications? No   Do you have difficulty managing your finances? No   Do you have difficulty affording your medications? No   Social Determinants of Health   Financial Resource Strain: Not on file  Food Insecurity: No Food Insecurity (11/27/2021)   Hunger Vital Sign    Worried About Running Out of Food in the Last Year: Never true    Ran Out of Food in the Last Year: Never true  Transportation Needs: No Transportation Needs (11/27/2021)   PRAPARE - Radiographer, therapeutic (Medical): No    Lack of Transportation (Non-Medical): No  Physical Activity: Not on file  Stress: Not on file  Social Connections: Not on file  Tobacco Counseling Counseling given: Not Answered   Clinical Intake:                 Diabetic?no         Activities of Daily Living    11/27/2021    5:00 PM 09/16/2021    3:53 PM  In your present state of health, do you have any difficulty performing the following activities:  Hearing? 1   Vision? 0   Difficulty concentrating or making decisions? 0   Walking or climbing stairs? 1   Dressing or bathing? 0   Doing errands, shopping? 1 1    Patient Care Team: Lauree Chandler, NP as PCP - General (Geriatric Medicine) Ngetich, Nelda Bucks, NP as Nurse Practitioner (Family Medicine)  Indicate any recent Medical Services you may have received from other than [facility] providers in the past year (date may be approximate).     Assessment:   This is a routine wellness examination for Sarah Hampton.  Hearing/Vision screen Hearing Screening - Comments:: Wears hearing aids  Vision Screening - Comments:: Last eye exam less than 12 months ago with [facility] physicians.   Dietary issues and exercise activities discussed:     Goals Addressed   None    Depression Screen    05/16/2021    3:20 PM 03/10/2021    3:16 PM 11/26/2020    9:06 AM 03/04/2020   11:52 AM 02/24/2019   11:04 AM 01/13/2019    1:33 PM  PHQ 2/9 Scores  PHQ - 2 Score 0 0 0 0 0 0    Fall Risk    12/04/2021    9:37 AM 12/04/2021    8:34 AM 09/29/2021    2:55 PM 09/22/2021    1:20 PM 09/15/2021    3:07 PM  Fall Risk   Falls in the past year? '1 1 1 1 1  '$ Number falls in past yr: 1 1 0 1 0  Injury with Fall? 0 0 1 0 0  Risk for fall due to : History of fall(s) Impaired balance/gait History of fall(s);Impaired balance/gait;Impaired mobility History of fall(s) No Fall Risks  Follow up Falls evaluation completed Falls evaluation completed Falls  evaluation completed;Education provided;Falls prevention discussed Falls evaluation completed Falls evaluation completed    FALL RISK PREVENTION PERTAINING TO THE HOME:  Any stairs in or around the home? No  If so, are there any without handrails?  na Home free of loose throw rugs in walkways, pet beds, electrical cords, etc? Yes  Adequate lighting in your home to reduce risk of falls? Yes   ASSISTIVE DEVICES UTILIZED TO PREVENT FALLS:  Life alert? Yes  Use of a cane, walker or w/c? Yes  Grab bars in the bathroom? Yes  Shower chair or bench in shower? Yes  Elevated toilet seat or a handicapped toilet? Yes   TIMED UP AND GO:  Was the test performed? No .   Cognitive Function:    02/12/2021    9:11 AM 02/24/2019   11:17 AM  MMSE - Mini Mental State Exam  Orientation to time 3 5  Orientation to Place 5 5  Registration 3 3  Attention/ Calculation 0 5  Recall 3 2  Language- name 2 objects 2 2  Language- repeat 1 1  Language- follow 3 step command 3 3  Language- read & follow direction 1 1  Write a sentence 1 1  Copy design 0 0  Total score 22 28  11/26/2020    9:12 AM  6CIT Screen  What Year? 0 points  What month? 0 points  What time? 0 points  Count back from 20 0 points  Months in reverse 4 points  Repeat phrase 6 points  Total Score 10 points    Immunizations Immunization History  Administered Date(s) Administered   Fluad Quad(high Dose 65+) 12/10/2020   Hep A / Hep B 02/15/2012, 03/17/2012, 08/16/2012   Influenza Split 02/12/2009, 01/09/2010   Influenza, High Dose Seasonal PF 01/28/2012, 02/10/2013, 01/23/2014, 02/07/2015, 12/18/2018, 01/30/2020   Influenza, Quadrivalent, Recombinant, Inj, Pf 01/10/2018   Influenza, Seasonal, Injecte, Preservative Fre 12/23/2010   Influenza,inj,Quad PF,6+ Mos 01/11/2017   Moderna Sars-Covid-2 Vaccination 04/24/2019, 05/22/2019, 01/30/2020   Pneumococcal Conjugate-13 01/23/2014   Pneumococcal Polysaccharide-23  01/27/2007   Typhoid Inactivated 02/15/2012    TDAP status: Due, Education has been provided regarding the importance of this vaccine. Advised may receive this vaccine at local pharmacy or Health Dept. Aware to provide a copy of the vaccination record if obtained from local pharmacy or Health Dept. Verbalized acceptance and understanding.  Flu Vaccine status: Due, Education has been provided regarding the importance of this vaccine. Advised may receive this vaccine at local pharmacy or Health Dept. Aware to provide a copy of the vaccination record if obtained from local pharmacy or Health Dept. Verbalized acceptance and understanding.  Pneumococcal vaccine status: Up to date  Covid-19 vaccine status: Information provided on how to obtain vaccines.   Qualifies for Shingles Vaccine? Yes   Zostavax completed No   Shingrix Completed?: No.    Education has been provided regarding the importance of this vaccine. Patient has been advised to call insurance company to determine out of pocket expense if they have not yet received this vaccine. Advised may also receive vaccine at local pharmacy or Health Dept. Verbalized acceptance and understanding.  Screening Tests Health Maintenance  Topic Date Due   TETANUS/TDAP  Never done   Zoster Vaccines- Shingrix (1 of 2) Never done   COVID-19 Vaccine (4 - Moderna risk series) 03/26/2020   INFLUENZA VACCINE  10/21/2021   Pneumonia Vaccine 62+ Years old  Completed   DEXA SCAN  Completed   HPV VACCINES  Aged Out    Health Maintenance  Health Maintenance Due  Topic Date Due   TETANUS/TDAP  Never done   Zoster Vaccines- Shingrix (1 of 2) Never done   COVID-19 Vaccine (4 - Moderna risk series) 03/26/2020   INFLUENZA VACCINE  10/21/2021    Colorectal cancer screening: No longer required.   Mammogram status: No longer required due to age.  Bone Density status: Completed June 2022. Results reflect: Bone density results: OSTEOPENIA. Repeat every 2  years.  Lung Cancer Screening: (Low Dose CT Chest recommended if Age 40-80 years, 30 pack-year currently smoking OR have quit w/in 15years.) does qualify.   Lung Cancer Screening Referral: na  Additional Screening:  Hepatitis C Screening: does not qualify  Vision Screening: Recommended annual ophthalmology exams for early detection of glaucoma and other disorders of the eye. Is the patient up to date with their annual eye exam?  Yes  Who is the provider or what is the name of the office in which the patient attends annual eye exams? [facility]  If pt is not established with a provider, would they like to be referred to a provider to establish care? No .   Dental Screening: Recommended annual dental exams for proper oral hygiene  Community Resource Referral / Chronic  Care Management: CRR required this visit?  No   CCM required this visit?  No      Plan:     I have personally reviewed and noted the following in the patient's chart:   Medical and social history Use of alcohol, tobacco or illicit drugs  Current medications and supplements including opioid prescriptions. Patient is not currently taking opioid prescriptions. Functional ability and status Nutritional status Physical activity Advanced directives List of other physicians Hospitalizations, surgeries, and ER visits in previous 12 months Vitals Screenings to include cognitive, depression, and falls Referrals and appointments  In addition, I have reviewed and discussed with patient certain preventive protocols, quality metrics, and best practice recommendations. A written personalized care plan for preventive services as well as general preventive health recommendations were provided to patient.     Lauree Chandler, NP   12/04/2021   Virtual Visit via Telephone Note  I connected with patient 12/04/21 at  2:40 PM EDT by telephone and verified that I am speaking with the correct person using two  identifiers.  Location: Patient: home Provider: [facility]    I discussed the limitations, risks, security and privacy concerns of performing an evaluation and management service by telephone and the availability of in person appointments. I also discussed with the patient that there may be a patient responsible charge related to this service. The patient expressed understanding and agreed to proceed.   I discussed the assessment and treatment plan with the patient. The patient was provided an opportunity to ask questions and all were answered. The patient agreed with the plan and demonstrated an understanding of the instructions.   The patient was advised to call back or seek an in-person evaluation if the symptoms worsen or if the condition fails to improve as anticipated.  I provided 15 minutes of non-face-to-face time during this encounter.  Carlos American. Harle Battiest [employer] printed and mailed

## 2021-12-04 NOTE — Progress Notes (Unsigned)
Subjective:   Sarah Hampton is a 88 y.o. female who presents for Medicare Annual (Subsequent) preventive examination.  Review of Systems           Objective:    There were no vitals filed for this visit. There is no height or weight on file to calculate BMI.     12/04/2021    8:35 AM 12/03/2021   11:29 AM 11/27/2021    4:19 AM 11/03/2021    8:29 AM 10/20/2021   12:31 PM 10/09/2021    2:40 PM 10/08/2021   11:25 AM  Advanced Directives  Does Patient Have a Medical Advance Directive? Yes Yes Yes Yes Yes Yes Yes  Type of Advance Directive Living will;Out of facility DNR (pink MOST or yellow form) Living will;Out of facility DNR (pink MOST or yellow form) Healthcare Power of [place];Living will;Out of facility DNR (pink MOST or yellow form)    Does patient want to make changes to medical advance directive? No - Patient declined No - Patient declined No - Patient declined No - Patient declined No - Patient declined No - Patient declined   Copy of [place] in Chart?    Yes - validated most recent copy scanned in chart (See row information) Yes - validated most recent copy scanned in chart (See row information), Physician notified    Would patient like information on creating a medical advance directive?    No - Patient declined No - Patient declined    Pre-existing out of facility DNR order (yellow form or pink MOST form) Yellow form placed in chart (order not valid for inpatient use);Pink MOST form placed in chart (order not valid for inpatient use) Yellow form placed in chart (order not valid for inpatient use);Pink MOST form placed in chart (order not valid for inpatient use)   Pink MOST/Yellow Form most recent copy in chart - Physician notified to receive inpatient order      Current Medications (verified) Outpatient Encounter Medications as of 12/04/2021  Medication Sig   acetaminophen (TYLENOL) 325 MG tablet Take 650 mg by mouth every  6 (six) hours as needed for moderate pain or mild pain.   atorvastatin (LIPITOR) 80 MG tablet TAKE 1 TABLET EVERY DAY  AT  6PM   Calcium Carbonate-Vitamin D 600-400 MG-UNIT tablet Take 1 tablet by mouth 2 (two) times daily.   feeding supplement (ENSURE ENLIVE / ENSURE PLUS) LIQD DRINK 1-CAN (237MLS) BY MOUTH AFTER LUNCH   levETIRAcetam (KEPPRA) 250 MG tablet Take 1 tablet (250 mg total) by mouth 2 (two) times daily.   lisinopril (ZESTRIL) 10 MG tablet Take 10 mg by mouth daily.   loperamide (IMODIUM) 2 MG capsule Take 4 mg by mouth as needed for diarrhea or loose stools.   memantine (NAMENDA) 5 MG tablet Take 1 tablet (5 mg total) by mouth 2 (two) times daily.   mometasone (NASONEX) 50 MCG/ACT nasal spray Place 2 sprays into the nose as needed. Seasonal- usually in the spring   pantoprazole (PROTONIX) 20 MG tablet Take 1 tablet (20 mg total) by mouth 2 (two) times daily.   vitamin B-12 (CYANOCOBALAMIN) 1000 MCG tablet Take 1 tablet (1,000 mcg total) by mouth daily.   [DISCONTINUED] Ensure (ENSURE) Take 237 mLs by mouth daily in the afternoon. After lunch   [START ON 12/05/2021] clopidogrel (PLAVIX) 75 MG tablet Take 1 tablet (75 mg total) by mouth daily. (Patient not taking: Reported on 12/04/2021)   No  facility-administered encounter medications on file as of 12/04/2021.    Allergies (verified) Patient has no known allergies.   History: Past Medical History:  Diagnosis Date   Arthritis    History of melanoma    shoulder   History of squamous cell carcinoma    History of TIA (transient ischemic attack)    Hypertension    Macrocytic anemia    Mini stroke    Per [place] New Patient Packet    Other hyperlipidemia    Personal history of malignant neoplasm of other organs and systems    Seizure [facility])    Past Surgical History:  Procedure Laterality Date   CATARACT EXTRACTION     DENTAL SURGERY     SKIN SURGERY  07/2018   SQUAMOUS CELL CARCINOMA EXCISION  09/10/2021   TONSILLECTOMY  1946    Per [place] New Patient Packet    Family History  Problem Relation Age of Onset   Dementia Mother 46   Breast cancer Mother    Pneumonia Father 75   Cancer Father    Thyroid disease Daughter    Thyroid disease Daughter    Stroke Neg Hx    Social History   Socioeconomic History   Marital status: Widowed    Spouse name: Not on file   Number of children: Not on file   Years of education: Not on file   Highest education level: Not on file  Occupational History   Occupation: retired  Tobacco Use   Smoking status: Former    Packs/day: 0.50    Years: 30.00    Total pack years: 15.00    Types: Cigarettes    Quit date: 09/28/1968    Years since quitting: 53.2   Smokeless tobacco: Never  Vaping Use   Vaping Use: Never used  Substance and Sexual Activity   Alcohol use: Not Currently   Drug use: Never   Sexual activity: Not on file  Other Topics Concern   Not on file  Social History Narrative   Per [place] Patient Packet 01/13/2019       Diet: No answer       Caffeine: Coffee       Married, if yes what year: Widowed, married in [place] you live in a house, apartment, assisted living, condo, trailer, ect: One stories, one person (apartment)       Pets: No      Current/Past profession: Museum/gallery conservator       Exercise:Yes, 5-6 x weekly          Living Will: Yes   DNR: Yes   POA/HPOA: Yes      Functional Status:   Do you have difficulty bathing or dressing yourself?No   Do you have difficulty preparing food or eating? No   Do you have difficulty managing your medications? No   Do you have difficulty managing your finances? No   Do you have difficulty affording your medications? No   Social Determinants of Health   Financial Resource Strain: Not on file  Food Insecurity: No Food Insecurity (11/27/2021)   Hunger Vital Sign    Worried About Running Out of Food in the Last Year: Never true    Ran Out of Food in the Last Year: Never true  Transportation  Needs: No Transportation Needs (11/27/2021)   PRAPARE - Hydrologist (Medical): No    Lack of Transportation (Non-Medical): No  Physical Activity:  Not on file  Stress: Not on file  Social Connections: Not on file    Tobacco Counseling Counseling given: Not Answered   Clinical Intake:                 Diabetic?no         Activities of Daily Living    11/27/2021    5:00 PM 09/16/2021    3:53 PM  In your present state of health, do you have any difficulty performing the following activities:  Hearing? 1   Vision? 0   Difficulty concentrating or making decisions? 0   Walking or climbing stairs? 1   Dressing or bathing? 0   Doing errands, shopping? 1 1    Patient Care Team: Lauree Chandler, NP as PCP - General (Geriatric Medicine) Ngetich, Nelda Bucks, NP as Nurse Practitioner (Family Medicine)  Indicate any recent Medical Services you may have received from other than [facility] providers in the past year (date may be approximate).     Assessment:   This is a routine wellness examination for Anysia.  Hearing/Vision screen Hearing Screening - Comments:: Patient with hearing concerns despite wearing hearing aids Vision Screening - Comments:: Last eye exam less than 12 months ago with [facility]   Dietary issues and exercise activities discussed:     Goals Addressed   None    Depression Screen    05/16/2021    3:20 PM 03/10/2021    3:16 PM 11/26/2020    9:06 AM 03/04/2020   11:52 AM 02/24/2019   11:04 AM 01/13/2019    1:33 PM  PHQ 2/9 Scores  PHQ - 2 Score 0 0 0 0 0 0    Fall Risk    12/04/2021    8:34 AM 09/29/2021    2:55 PM 09/22/2021    1:20 PM 09/15/2021    3:07 PM 05/16/2021    3:19 PM  [place] in the past year? '1 1 1 1 1  '$ Number falls in past yr: 1 0 1 0 1  Injury with Fall? 0 1 0 0 0  Risk for fall due to : Impaired balance/gait History of fall(s);Impaired balance/gait;Impaired mobility History  of fall(s) No Fall Risks History of fall(s)  Follow up Falls evaluation completed Falls evaluation completed;Education provided;Falls prevention discussed Falls evaluation completed Falls evaluation completed Falls evaluation completed    FALL RISK PREVENTION PERTAINING TO THE HOME:  Any stairs in or around the home? {YES/NO:21197} If so, are there any without handrails? {YES/NO:21197} Home free of loose throw rugs in walkways, pet beds, electrical cords, etc? {YES/NO:21197} Adequate lighting in your home to reduce risk of falls? {YES/NO:21197}  ASSISTIVE DEVICES UTILIZED TO PREVENT FALLS:  Life alert? {YES/NO:21197} Use of a cane, walker or w/c? {YES/NO:21197} Grab bars in the bathroom? {YES/NO:21197} Shower chair or bench in shower? {YES/NO:21197} Elevated toilet seat or a handicapped toilet? {YES/NO:21197}  TIMED UP AND GO:  Was the test performed? No .    Cognitive Function:    02/12/2021    9:11 AM 02/24/2019   11:17 AM  MMSE - Mini Mental State Exam  Orientation to time 3 5  Orientation to Place 5 5  Registration 3 3  Attention/ Calculation 0 5  Recall 3 2  Language- name 2 objects 2 2  Language- repeat 1 1  Language- follow 3 step command 3 3  Language- read & follow direction 1 1  Write a sentence 1 1  Copy design  0 0  Total score 22 28        11/26/2020    9:12 AM  6CIT Screen  What Year? 0 points  What month? 0 points  What time? 0 points  Count back from 20 0 points  Months in reverse 4 points  Repeat phrase 6 points  Total Score 10 points    Immunizations Immunization History  Administered Date(s) Administered   Fluad Quad(high Dose 65+) 12/10/2020   Hep A / Hep B 02/15/2012, 03/17/2012, 08/16/2012   Influenza Split 02/12/2009, 01/09/2010   Influenza, High Dose Seasonal PF 01/28/2012, 02/10/2013, 01/23/2014, 02/07/2015, 12/18/2018, 01/30/2020   Influenza, Quadrivalent, Recombinant, Inj, Pf 01/10/2018   Influenza, Seasonal, Injecte,  Preservative Fre 12/23/2010   Influenza,inj,Quad PF,6+ Mos 01/11/2017   Moderna Sars-Covid-2 Vaccination 04/24/2019, 05/22/2019, 01/30/2020   Pneumococcal Conjugate-13 01/23/2014   Pneumococcal Polysaccharide-23 01/27/2007   Typhoid Inactivated 02/15/2012    TDAP status: Due, Education has been provided regarding the importance of this vaccine. Advised may receive this vaccine at local pharmacy or Health Dept. Aware to provide a copy of the vaccination record if obtained from local pharmacy or Health Dept. Verbalized acceptance and understanding.  Flu Vaccine status: Due, Education has been provided regarding the importance of this vaccine. Advised may receive this vaccine at local pharmacy or Health Dept. Aware to provide a copy of the vaccination record if obtained from local pharmacy or Health Dept. Verbalized acceptance and understanding.  Pneumococcal vaccine status: Up to date  Covid-19 vaccine status: Information provided on how to obtain vaccines.   Qualifies for Shingles Vaccine? Yes   Zostavax completed No   Shingrix Completed?: No.    Education has been provided regarding the importance of this vaccine. Patient has been advised to call insurance company to determine out of pocket expense if they have not yet received this vaccine. Advised may also receive vaccine at local pharmacy or Health Dept. Verbalized acceptance and understanding.  Screening Tests Health Maintenance  Topic Date Due   TETANUS/TDAP  Never done   Zoster Vaccines- Shingrix (1 of 2) Never done   COVID-19 Vaccine (4 - Moderna risk series) 03/26/2020   INFLUENZA VACCINE  10/21/2021   Pneumonia Vaccine 44+ Years old  Completed   DEXA SCAN  Completed   HPV VACCINES  Aged Out    Health Maintenance  Health Maintenance Due  Topic Date Due   TETANUS/TDAP  Never done   Zoster Vaccines- Shingrix (1 of 2) Never done   COVID-19 Vaccine (4 - Moderna risk series) 03/26/2020   INFLUENZA VACCINE  10/21/2021     Colorectal cancer screening: No longer required.   Mammogram status: No longer required due to age.  Bone Density status: Completed 09/17/20. Results reflect: Bone density results: OSTEOPENIA. Repeat every 2 years.  Lung Cancer Screening: (Low Dose CT Chest recommended if Age 24-80 years, 30 pack-year currently smoking OR have quit w/in 15years.) does not qualify.   Lung Cancer Screening Referral: na  Additional Screening:  Hepatitis C Screening: does not qualify;   Vision Screening: Recommended annual ophthalmology exams for early detection of glaucoma and other disorders of the eye. Is the patient up to date with their annual eye exam?  Yes  Who is the provider or what is the name of the office in which the patient attends annual eye exams? [facility] If pt is not established with a provider, would they like to be referred to a provider to establish care? No .   Dental Screening:  Recommended annual dental exams for proper oral hygiene  Community Resource Referral / Chronic Care Management: CRR required this visit?  No   CCM required this visit?  No      Plan:     I have personally reviewed and noted the following in the patient's chart:   Medical and social history Use of alcohol, tobacco or illicit drugs  Current medications and supplements including opioid prescriptions. Patient is not currently taking opioid prescriptions. Functional ability and status Nutritional status Physical activity Advanced directives List of other physicians Hospitalizations, surgeries, and ER visits in previous 12 months Vitals Screenings to include cognitive, depression, and falls Referrals and appointments  In addition, I have reviewed and discussed with patient certain preventive protocols, quality metrics, and best practice recommendations. A written personalized care plan for preventive services as well as general preventive health recommendations were provided to patient.      Lauree Chandler, NP   12/04/2021    Virtual Visit via Telephone Note  I connected with patient 12/04/21 at  9:00 AM EDT by telephone and verified that I am speaking with the correct person using two identifiers.  Location: Patient: *** Provider: ***   I discussed the limitations, risks, security and privacy concerns of performing an evaluation and management service by telephone and the availability of in person appointments. I also discussed with the patient that there may be a patient responsible charge related to this service. The patient expressed understanding and agreed to proceed.   I discussed the assessment and treatment plan with the patient. The patient was provided an opportunity to ask questions and all were answered. The patient agreed with the plan and demonstrated an understanding of the instructions.   The patient was advised to call back or seek an in-person evaluation if the symptoms worsen or if the condition fails to improve as anticipated.  I provided *** minutes of non-face-to-face time during this encounter.  [employer]. Harle [employer] printed and mailed

## 2021-12-04 NOTE — Patient Instructions (Signed)
Sarah Hampton , Thank you for taking time to come for your Medicare Wellness Visit. I appreciate your ongoing commitment to your health goals. Please review the following plan we discussed and let me know if I can assist you in the future.   Screening recommendations/referrals: Colonoscopy aged out Mammogram aged out Bone Density up to date Recommended yearly ophthalmology/optometry visit for glaucoma screening and checkup Recommended yearly dental visit for hygiene and checkup  Vaccinations: Influenza vaccine- due annually in September/October Pneumococcal vaccine up to date Tdap vaccine DUE- recommend to get at your local pharmacy. Shingles vaccine DUE- recommend to get at your local pharmacy    Advanced directives: on file.    Preventive Care 3 Years and Older, Female Preventive care refers to lifestyle choices and visits with your health care provider that can promote health and wellness. What does preventive care include? A yearly physical exam. This is also called an annual well check. Dental exams once or twice a year. Routine eye exams. Ask your health care provider how often you should have your eyes checked. Personal lifestyle choices, including: Daily care of your teeth and gums. Regular physical activity. Eating a healthy diet. Avoiding tobacco and drug use. Limiting alcohol use. Practicing safe sex. Taking low-dose aspirin every day. Taking vitamin and mineral supplements as recommended by your health care provider. What happens during an annual well check? The services and screenings done by your health care provider during your annual well check will depend on your age, overall health, lifestyle risk factors, and family history of disease. Counseling  Your health care provider may ask you questions about your: Alcohol use. Tobacco use. Drug use. Emotional well-being. Home and relationship well-being. Sexual activity. Eating habits. History of falls. Memory  and ability to understand (cognition). Work and work Statistician. Reproductive health. Screening  You may have the following tests or measurements: Height, weight, and BMI. Blood pressure. Lipid and cholesterol levels. These may be checked every 5 years, or more frequently if you are over 74 years old. Skin check. Lung cancer screening. You may have this screening every year starting at age 5 if you have a 30-pack-year history of smoking and currently smoke or have quit within the past 15 years. Fecal occult blood test (FOBT) of the stool. You may have this test every year starting at age 27. Flexible sigmoidoscopy or colonoscopy. You may have a sigmoidoscopy every 5 years or a colonoscopy every 10 years starting at age 17. Hepatitis C blood test. Hepatitis B blood test. Sexually transmitted disease (STD) testing. Diabetes screening. This is done by checking your blood sugar (glucose) after you have not eaten for a while (fasting). You may have this done every 1-3 years. Bone density scan. This is done to screen for osteoporosis. You may have this done starting at age 32. Mammogram. This may be done every 1-2 years. Talk to your health care provider about how often you should have regular mammograms. Talk with your health care provider about your test results, treatment options, and if necessary, the need for more tests. Vaccines  Your health care provider may recommend certain vaccines, such as: Influenza vaccine. This is recommended every year. Tetanus, diphtheria, and acellular pertussis (Tdap, Td) vaccine. You may need a Td booster every 10 years. Zoster vaccine. You may need this after age 17. Pneumococcal 13-valent conjugate (PCV13) vaccine. One dose is recommended after age 44. Pneumococcal polysaccharide (PPSV23) vaccine. One dose is recommended after age 3. Talk to your health care provider  about which screenings and vaccines you need and how often you need them. This  information is not intended to replace advice given to you by your health care provider. Make sure you discuss any questions you have with your health care provider. Document Released: 04/05/2015 Document Revised: 11/27/2015 Document Reviewed: 01/08/2015 Elsevier Interactive Patient Education  2017 [place] Prevention in the Home Falls can cause injuries. They can happen to people of all ages. There are many things you can do to make your home safe and to help prevent falls. What can I do on the outside of my home? Regularly fix the edges of walkways and driveways and fix any cracks. Remove anything that might make you trip as you walk through a door, such as a raised step or threshold. Trim any bushes or trees on the path to your home. Use bright outdoor lighting. Clear any walking paths of anything that might make someone trip, such as rocks or tools. Regularly check to see if handrails are loose or broken. Make sure that both sides of any steps have handrails. Any raised decks and porches should have guardrails on the edges. Have any leaves, snow, or ice cleared regularly. Use sand or salt on walking paths during winter. Clean up any spills in your garage right away. This includes oil or grease spills. What can I do in the bathroom? Use night lights. Install grab bars by the toilet and in the tub and shower. Do not use towel bars as grab bars. Use non-skid mats or decals in the tub or shower. If you need to sit down in the shower, use a plastic, non-slip stool. Keep the floor dry. Clean up any water that spills on the floor as soon as it happens. Remove soap buildup in the tub or shower regularly. Attach bath mats securely with double-sided non-slip rug tape. Do not have throw rugs and other things on the floor that can make you trip. What can I do in the bedroom? Use night lights. Make sure that you have a light by your bed that is easy to reach. Do not use any sheets or  blankets that are too big for your bed. They should not hang down onto the floor. Have a firm chair that has side arms. You can use this for support while you get dressed. Do not have throw rugs and other things on the floor that can make you trip. What can I do in the kitchen? Clean up any spills right away. Avoid walking on wet floors. Keep items that you use a lot in easy-to-reach places. If you need to reach something above you, use a strong step stool that has a grab bar. Keep electrical cords out of the way. Do not use floor polish or wax that makes floors slippery. If you must use wax, use non-skid floor wax. Do not have throw rugs and other things on the floor that can make you trip. What can I do with my stairs? Do not leave any items on the stairs. Make sure that there are handrails on both sides of the stairs and use them. Fix handrails that are broken or loose. Make sure that handrails are as long as the stairways. Check any carpeting to make sure that it is firmly attached to the stairs. Fix any carpet that is loose or worn. Avoid having throw rugs at the top or bottom of the stairs. If you do have throw rugs, attach them to the floor  with carpet tape. Make sure that you have a light switch at the top of the stairs and the bottom of the stairs. If you do not have them, ask someone to add them for you. What else can I do to help prevent falls? Wear shoes that: Do not have high heels. Have rubber bottoms. Are comfortable and fit you well. Are closed at the toe. Do not wear sandals. If you use a stepladder: Make sure that it is fully opened. Do not climb a closed stepladder. Make sure that both sides of the stepladder are locked into place. Ask someone to hold it for you, if possible. Clearly mark and make sure that you can see: Any grab bars or handrails. First and last steps. Where the edge of each step is. Use tools that help you move around (mobility aids) if they are  needed. These include: Canes. Walkers. Scooters. Crutches. Turn on the lights when you go into a dark area. Replace any light bulbs as soon as they burn out. Set up your furniture so you have a clear path. Avoid moving your furniture around. If any of your floors are uneven, fix them. If there are any pets around you, be aware of where they are. Review your medicines with your doctor. Some medicines can make you feel dizzy. This can increase your chance of falling. Ask your doctor what other things that you can do to help prevent falls. This information is not intended to replace advice given to you by your health care provider. Make sure you discuss any questions you have with your health care provider. Document Released: 01/03/2009 Document Revised: 08/15/2015 Document Reviewed: 04/13/2014 Elsevier Interactive Patient Education  2017 [employer].

## 2021-12-05 ENCOUNTER — Ambulatory Visit (INDEPENDENT_AMBULATORY_CARE_PROVIDER_SITE_OTHER): Payer: Medicare HMO | Admitting: Nurse Practitioner

## 2021-12-05 ENCOUNTER — Encounter: Payer: Self-pay | Admitting: Nurse Practitioner

## 2021-12-05 VITALS — BP 116/58 | HR 74 | Temp 97.4°F | Ht 59.0 in | Wt 97.0 lb

## 2021-12-05 DIAGNOSIS — Z23 Encounter for immunization: Secondary | ICD-10-CM | POA: Diagnosis not present

## 2021-12-05 DIAGNOSIS — I10 Essential (primary) hypertension: Secondary | ICD-10-CM | POA: Diagnosis not present

## 2021-12-05 DIAGNOSIS — Z7189 Other specified counseling: Secondary | ICD-10-CM

## 2021-12-05 DIAGNOSIS — F0150 Vascular dementia without behavioral disturbance: Secondary | ICD-10-CM | POA: Diagnosis not present

## 2021-12-05 DIAGNOSIS — D7581 Myelofibrosis: Secondary | ICD-10-CM

## 2021-12-05 DIAGNOSIS — K219 Gastro-esophageal reflux disease without esophagitis: Secondary | ICD-10-CM | POA: Diagnosis not present

## 2021-12-05 NOTE — Progress Notes (Unsigned)
Careteam: Patient Care Team: Lauree Chandler, NP as PCP - General (Geriatric Medicine) Ngetich, Nelda Bucks, NP as Nurse Practitioner (Family Medicine)  PLACE OF SERVICE:  [place] Directive information    No Known Allergies  Chief Complaint  Patient presents with   Medical Management of Chronic Issues    Three month follow up. Discuss need for Shingles, Covid and Flu vaccine.Headaches in the morning, Right hand is numb, burning, hurting. Discuss Lisinopril. Request to examine left eye lid.      HPI: Patient is a 88 y.o. female for follow up.   Yesterday blood pressure was 117/50, blood pressure well controlled today- was placed on prior dose of BP medication so daughter was concern.   She is doing physical therapy through her AL.   Protonix was added during hospitalization, daughter states hospital recommended GI consult but daughter wonders if this is truly necessary.   She wants to stop going to the dermatologist routinely for her skin checks. She will have new growths on her legs.   Memory is progressively worsening.  Pt reports she wants to stop going to the hematologist and getting blood work and blood transfusion. She is aware that theses are needed to sustain her life but she does not want to do them any more. She does not like being stuck for blood work or transfusion.   Review of Systems:  Review of Systems  Constitutional:  Negative for chills, fever and weight loss.  HENT:  Negative for tinnitus.   Respiratory:  Negative for cough, sputum production and shortness of breath.   Cardiovascular:  Negative for chest pain, palpitations and leg swelling.  Gastrointestinal:  Negative for abdominal pain, constipation, diarrhea and heartburn.  Genitourinary:  Negative for dysuria, frequency and urgency.  Musculoskeletal:  Negative for back pain, falls, joint pain and myalgias.  Skin: Negative.   Neurological:  Negative for dizziness and headaches.   Psychiatric/Behavioral:  Positive for memory loss. Negative for depression. The patient does not have insomnia.     Past Medical History:  Diagnosis Date   Arthritis    History of melanoma    shoulder   History of squamous cell carcinoma    History of TIA (transient ischemic attack)    Hypertension    Macrocytic anemia    Mini stroke    Per [facility] Packet    Other hyperlipidemia    Personal history of malignant neoplasm of other organs and systems    Seizure [facility])    Past Surgical History:  Procedure Laterality Date   CATARACT EXTRACTION     DENTAL SURGERY     SKIN SURGERY  07/2018   SQUAMOUS CELL CARCINOMA EXCISION  09/10/2021   TONSILLECTOMY  1946   Per [facility] Patient Packet    Social History:   reports that she quit smoking about 53 years ago. Her smoking use included cigarettes. She has a 15.00 pack-year smoking history. She has never used smokeless tobacco. She reports that she does not currently use alcohol. She reports that she does not use drugs.  Family History  Problem Relation Age of Onset   Dementia Mother 70   Breast cancer Mother    Pneumonia Father 69   Cancer Father    Thyroid disease Daughter    Thyroid disease Daughter    Stroke Neg Hx     Medications: Patient's Medications  New Prescriptions   No medications on file  Previous Medications   ACETAMINOPHEN (  TYLENOL) 325 MG TABLET    Take 650 mg by mouth every 6 (six) hours as needed for moderate pain or mild pain.   ATORVASTATIN (LIPITOR) 80 MG TABLET    TAKE 1 TABLET EVERY DAY  AT  6PM   CALCIUM CARBONATE-VITAMIN D 600-400 MG-UNIT TABLET    Take 1 tablet by mouth 2 (two) times daily.   CLOPIDOGREL (PLAVIX) 75 MG TABLET    Take 1 tablet (75 mg total) by mouth daily.   FEEDING SUPPLEMENT (ENSURE ENLIVE / ENSURE PLUS) LIQD    DRINK 1-CAN (237MLS) BY MOUTH AFTER LUNCH   LEVETIRACETAM (KEPPRA) 250 MG TABLET    Take 1 tablet (250 mg total) by mouth 2 (two) times daily.   LISINOPRIL (ZESTRIL)  10 MG TABLET    Take 10 mg by mouth daily.   LOPERAMIDE (IMODIUM) 2 MG CAPSULE    Take 4 mg by mouth as needed for diarrhea or loose stools.   MEMANTINE (NAMENDA) 5 MG TABLET    Take 1 tablet (5 mg total) by mouth 2 (two) times daily.   MOMETASONE (NASONEX) 50 MCG/ACT NASAL SPRAY    Place 2 sprays into the nose as needed. Seasonal- usually in the spring   PANTOPRAZOLE (PROTONIX) 20 MG TABLET    Take 1 tablet (20 mg total) by mouth 2 (two) times daily.   VITAMIN B-12 (CYANOCOBALAMIN) 1000 MCG TABLET    Take 1 tablet (1,000 mcg total) by mouth daily.  Modified Medications   No medications on file  Discontinued Medications   No medications on file    Physical Exam:  Vitals:   12/05/21 1315  BP: (!) 116/58  Pulse: 74  Temp: (!) 97.4 F (36.3 C)  SpO2: 98%  Weight: 97 lb (44 kg)  Height: '4\' 11"'$  (1.499 m)   Body mass index is 19.59 kg/m. Wt Readings from Last 3 Encounters:  12/05/21 97 lb (44 kg)  11/27/21 94 lb 12.8 oz (43 kg)  11/18/21 95 lb (43.1 kg)    Physical Exam Constitutional:      General: She is not in acute distress.    Appearance: She is well-developed. She is not diaphoretic.  HENT:     Head: Normocephalic and atraumatic.     Mouth/Throat:     Pharynx: No oropharyngeal exudate.  Eyes:     Conjunctiva/sclera: Conjunctivae normal.     Pupils: Pupils are equal, round, and reactive to light.  Cardiovascular:     Rate and Rhythm: Normal rate and regular rhythm.     Heart sounds: Normal heart sounds.  Pulmonary:     Effort: Pulmonary effort is normal.     Breath sounds: Normal breath sounds.  Abdominal:     General: Bowel sounds are normal.     Palpations: Abdomen is soft.  Musculoskeletal:     Cervical back: Normal range of motion and neck supple.     Right lower leg: No edema.     Left lower leg: No edema.  Skin:    General: Skin is warm and dry.  Neurological:     Mental Status: She is alert. Mental status is at baseline.  Psychiatric:        Mood and  Affect: Mood normal.     Labs reviewed: Basic Metabolic Panel: Recent Labs    12/25/20 1351 03/16/21 2043 09/17/21 0443 09/22/21 1338 11/18/21 1229 11/27/21 0435 11/28/21 0554  NA 136   < > 137   < > 128* 132* 135  K 4.7   < >  4.3   < > 4.4 5.2* 4.6  CL 101   < > 105   < > 96* 101 107  CO2 25   < > 23   < > 29 21* 24  GLUCOSE 96   < > 106*   < > 118* 109* 96  BUN 21   < > 25*   < > 19 37* 26*  CREATININE 1.17*   < > 1.00   < > 0.75 1.07* 0.94  CALCIUM 9.0   < > 8.8*   < > 9.6 9.1 8.3*  TSH 1.87  --  2.287  --   --   --   --    < > = values in this interval not displayed.   Liver Function Tests: Recent Labs    10/08/21 1238 11/18/21 1229 11/27/21 0435  AST '20 22 25  '$ ALT '18 23 19  '$ ALKPHOS 145* 162* 139*  BILITOT 0.7 0.9 1.0  PROT 6.0* 6.3* 6.7  ALBUMIN 4.1 4.3 4.1   No results for input(s): "LIPASE", "AMYLASE" in the last 8760 hours. No results for input(s): "AMMONIA" in the last 8760 hours. CBC: Recent Labs    11/18/21 1229 11/27/21 0435 11/28/21 0554 11/29/21 0703 11/30/21 0555  WBC 4.2   < > 2.9* 3.1* 3.7*  NEUTROABS 2.7  --   --  1.8 1.8  HGB 8.2*   < > 7.8* 9.7* 10.5*  HCT 24.9*   < > 24.3* 30.6* 33.2*  MCV 92.9   < > 97.2 92.7 92.7  PLT 76*   < > 45* 40* 44*   < > = values in this interval not displayed.   Lipid Panel: Recent Labs    03/10/21 1544  CHOL 94  HDL 39*  LDLCALC 35  TRIG 122  CHOLHDL 2.4   TSH: Recent Labs    12/25/20 1351 09/17/21 0443  TSH 1.87 2.287   A1C: Lab Results  Component Value Date   HGBA1C 5.5 04/16/2020     Assessment/Plan 1. Vascular dementia without behavioral disturbance [facility]) -daughter reports her memory continues to decline and dementia has progressed however today she is very clear, alert and knows what she wants with the continuation of her care. She is ready to transition to a comfort approach and even stated she thought hospice would be a good option for her.  - Ambulatory referral to Hospice for  further evaluation and symptom management  2. Myelofibrosis ([place]) She has required multiple blood draws and transfusions and she does not wish to do this anymore. She had a clear discussion with her daughter and myself during this visit about what her wishes were. She does not wish to go back to hematology and wants comfort measures only at this time.  - Ambulatory referral to Hospice  3. Need for influenza vaccination - Flu Vaccine QUAD High Dose(Fluad)  4. Advance care planning -discussed today with patient and daughter, hospice has been consulted.  5. Gastroesophageal reflux disease without esophagitis -will continue Protonix at this time for symptom management.   6. Primary hypertension Stable, will continue medication for now but consider stopping If BP goes lower or she becomes symptomatic.   Return in about 3 months (around 03/06/2022) for routine follow up .or sooner if needed  Carlos American. [place], [facility] [phone]

## 2021-12-08 ENCOUNTER — Telehealth: Payer: Self-pay

## 2021-12-08 ENCOUNTER — Telehealth: Payer: Self-pay | Admitting: *Deleted

## 2021-12-08 NOTE — Telephone Encounter (Signed)
I have sent a message to our office referral representatives.

## 2021-12-08 NOTE — Addendum Note (Signed)
Addended by: Logan Bores on: 12/08/2021 04:02 PM   Modules accepted: Orders

## 2021-12-08 NOTE — Telephone Encounter (Signed)
Referral has been updated to request sent to [facility] care

## 2021-12-08 NOTE — Addendum Note (Signed)
Addended by: Logan Bores on: 12/08/2021 02:32 PM   Modules accepted: Orders

## 2021-12-08 NOTE — Telephone Encounter (Signed)
TCT pt's daughter as requested late Friday afternoon on 12/05/21 Spoke with her. She states that her mother has decided that she does not want any more transfusions and that a hospice referral has been made. Daughter is asking about what to expect as her mother declines. Pt has myelofibrosis with low HGB and platelets. Last HGB was 10.5 and last platelet count was 44K. Advised that as her mother becomes more and more anemic, she will be progressively, weaker, more fatigued, more short of breath. She may sleep more, eat less. Daughter states she is in the bed most of the time now, but still eats "OK"  She needs assistance to the bathroom. Advised that if her platelets continue to drop she is at risk of bleeding, is her count is <10 she could have spontaneous bleeding-whether it is GI/GU, brain for example.  Daughter is asking how long to expect her to survive. Advised that this is a difficult question to answer but expectations could be from weeks to months.  It depends on how quickly her blood counts drop and how she responds to those blood counts. Daughter states that a hospice referral has been made and she is also trying to arrange times for family members to visit. Daughter voiced understanding to the above. She states she will call with any further questions or concerns.

## 2021-12-08 NOTE — Telephone Encounter (Signed)
Patient's daughter called inquiring about Hospice referral.She would like to know if it can be sent to [facility] (patient residence).   Message routed to [employer]

## 2021-12-08 NOTE — Telephone Encounter (Signed)
Please contact the referral team and let them know her request.

## 2021-12-09 DIAGNOSIS — R2689 Other abnormalities of gait and mobility: Secondary | ICD-10-CM | POA: Diagnosis not present

## 2021-12-09 NOTE — Telephone Encounter (Signed)
Referral was emailed to [employer] (RN) with [facility] per his request to have on file for their records.

## 2021-12-10 ENCOUNTER — Ambulatory Visit: Payer: Medicare HMO | Admitting: Podiatry

## 2021-12-11 ENCOUNTER — Telehealth: Payer: Self-pay

## 2021-12-11 NOTE — Telephone Encounter (Signed)
T/C from [employer] at [facility] to advise of pt's admission to Hospice. No labs will be drawn and only comfort care will be provided.

## 2021-12-12 ENCOUNTER — Ambulatory Visit: Payer: Medicare HMO | Admitting: Podiatry

## 2022-01-05 ENCOUNTER — Ambulatory Visit: Payer: Medicare HMO | Admitting: Hematology and Oncology

## 2022-01-05 ENCOUNTER — Other Ambulatory Visit: Payer: Medicare HMO

## 2022-02-11 ENCOUNTER — Telehealth: Payer: Self-pay | Admitting: Neurology

## 2022-02-11 NOTE — Telephone Encounter (Signed)
Pt is deceased since 02/04/2022.

## 2022-02-17 ENCOUNTER — Ambulatory Visit: Payer: Medicare HMO | Admitting: Neurology

## 2022-02-20 ENCOUNTER — Non-Acute Institutional Stay: Payer: Medicare HMO | Admitting: Hospice

## 2022-02-20 DIAGNOSIS — Z515 Encounter for palliative care: Secondary | ICD-10-CM

## 2022-02-20 NOTE — Progress Notes (Signed)
NP was informed by facility staff [facility] that patient had passed.

## 2022-02-20 DEATH — deceased

## 2022-03-25 ENCOUNTER — Ambulatory Visit: Payer: Medicare HMO | Admitting: Podiatry

## 2022-10-06 IMAGING — RF DG SWALLOWING FUNCTION
14 of 15 series · 14 of 24 positions shown · non-contrast
Comparison: None.

CLINICAL DATA: Dysphagia.

EXAM:
MODIFIED BARIUM SWALLOW
TECHNIQUE: Different consistencies of barium were administered orally to the
patient by the Speech Pathologist. Imaging of the pharynx was
performed in the lateral projection. The radiologist was present in
the fluoroscopy room for this study, providing personal supervision.
FLUOROSCOPY:
Radiation Exposure Index (if provided by the fluoroscopic device):
1.3 mGy

[Series 1: cp_standard · 1 of 6 frames shown (1 of 14)]
[frame 1/6]
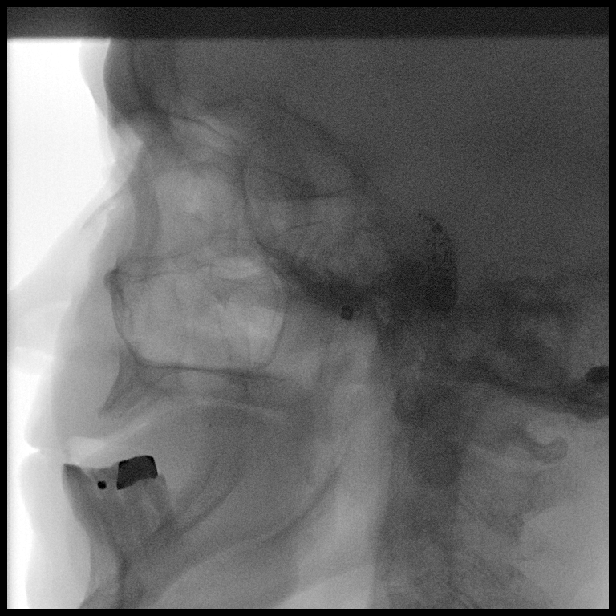

[Series 2: cp_standard · 1 of 4 frames shown (2 of 14)]
[frame 3/4]
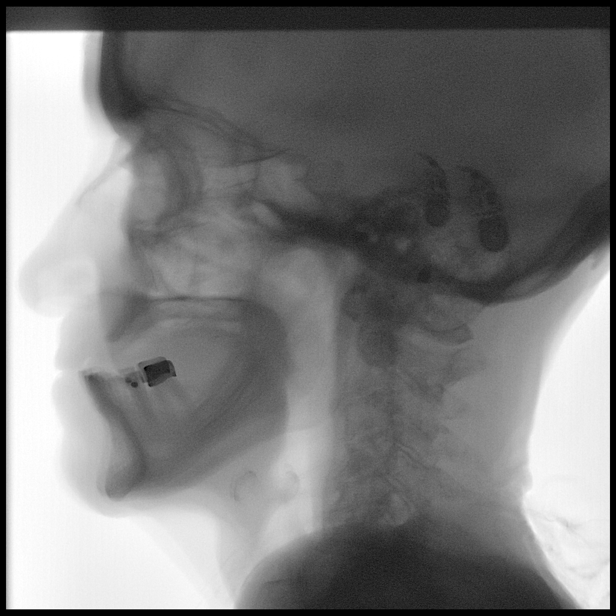

[Series 3: cp_standard · 1 of 14 frames shown (3 of 14)]
[frame 12/14]
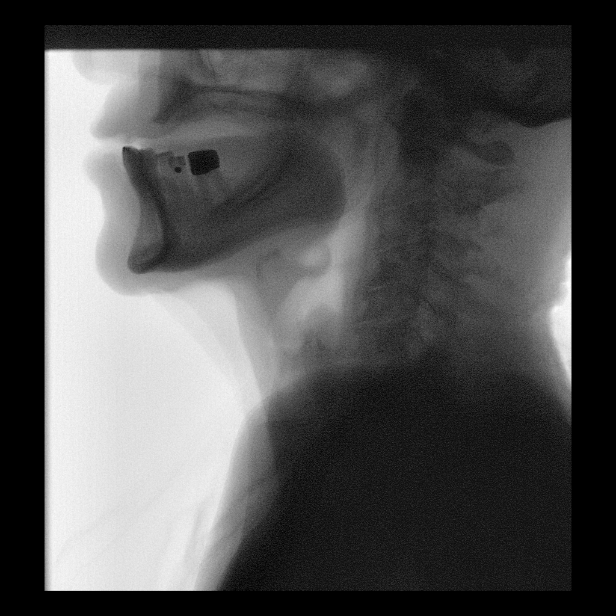

[Series 5: cp_standard · 1 of 43 frames shown (4 of 14)]
[frame 7/43]
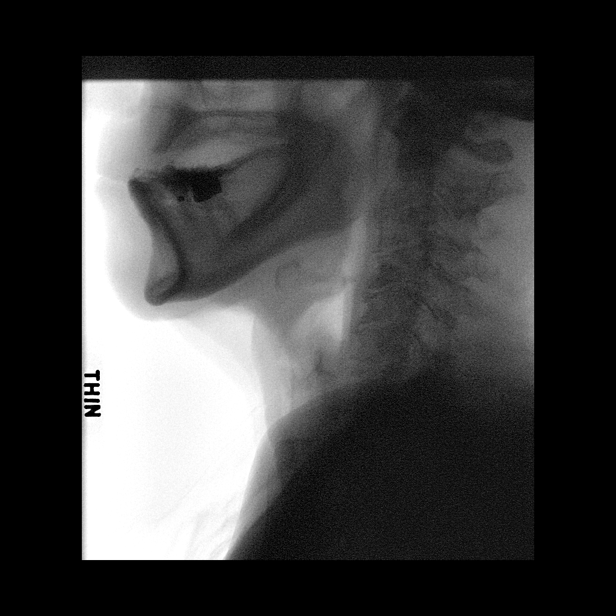

[Series 6: cp_standard · 1 of 100 frames shown (5 of 14)]
[frame 16/100]
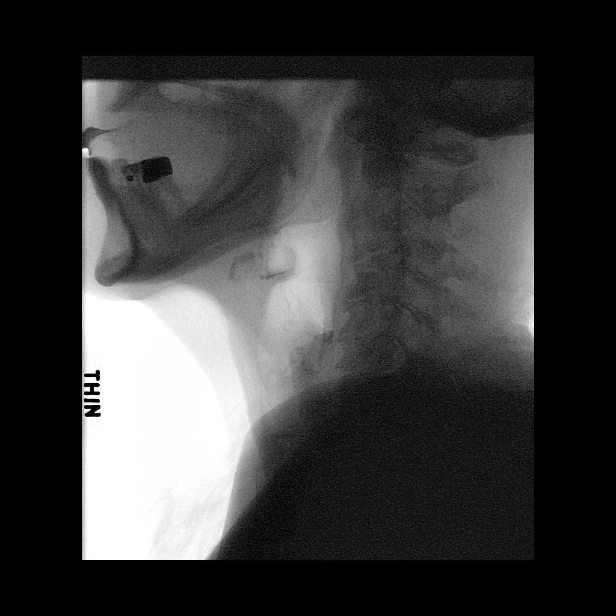

[Series 7: cp_standard · 1 of 116 frames shown (6 of 14)]
[frame 59/116]
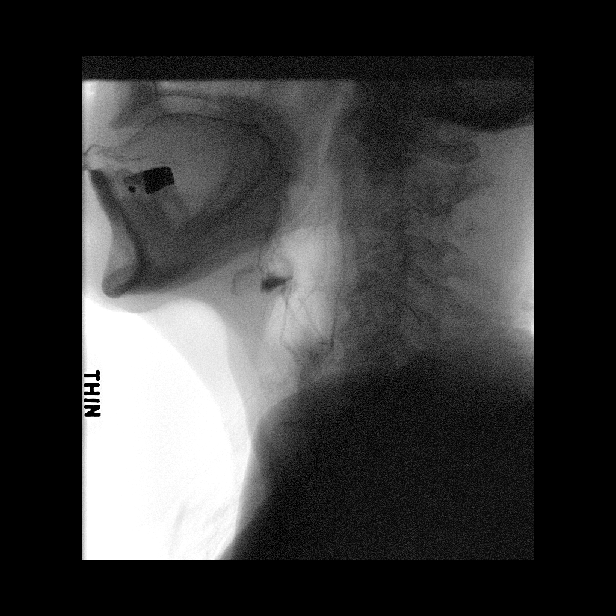

[Series 8: cp_standard · 1 of 21 frames shown (7 of 14)]
[frame 11/21]
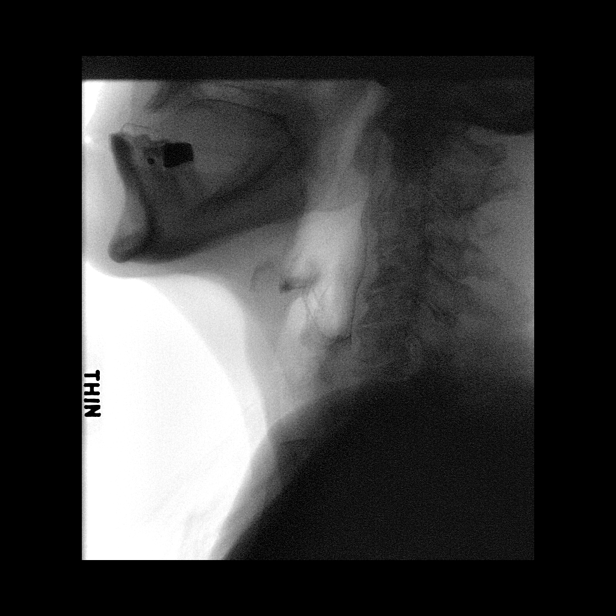

[Series 9: cp_standard · 1 of 142 frames shown (8 of 14)]
[frame 45/142]
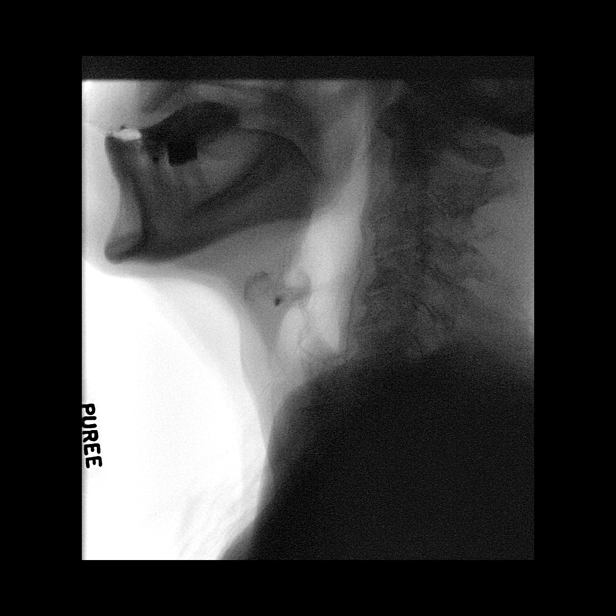

[Series 10: cp_standard · 1 of 87 frames shown (9 of 14)]
[frame 44/87]
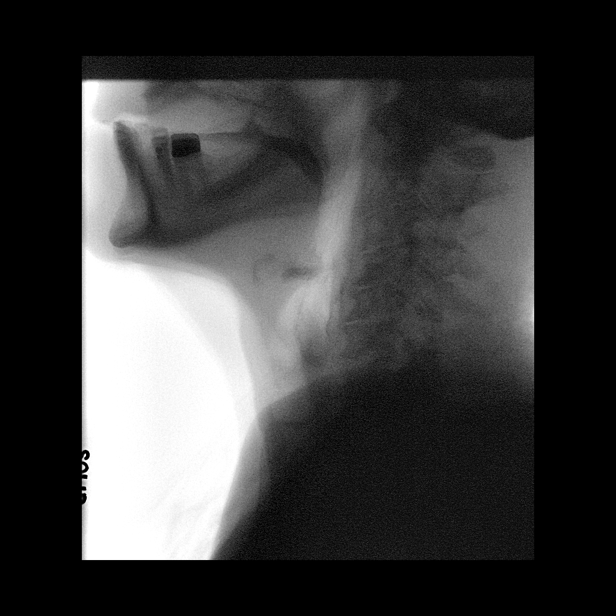

[Series 11: cp_standard · 1 of 177 frames shown (10 of 14)]
[frame 114/177]
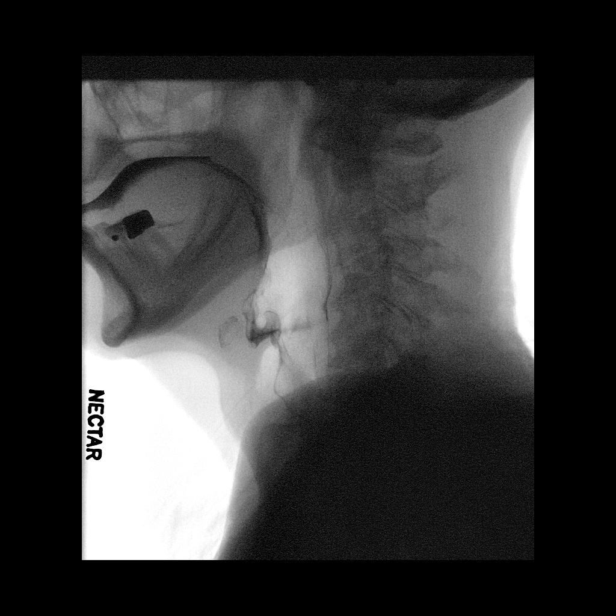

[Series 12: cp_standard · 1 of 110 frames shown (11 of 14)]
[frame 96/110]
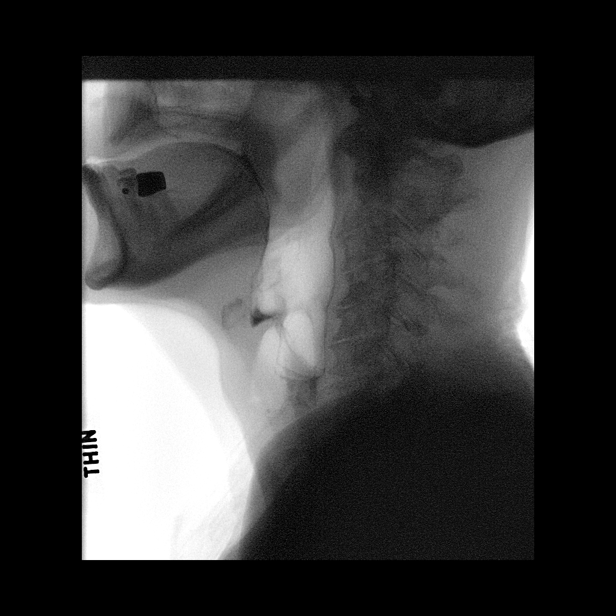

[Series 13: cp_standard · 1 of 169 frames shown (12 of 14)]
[frame 34/169]
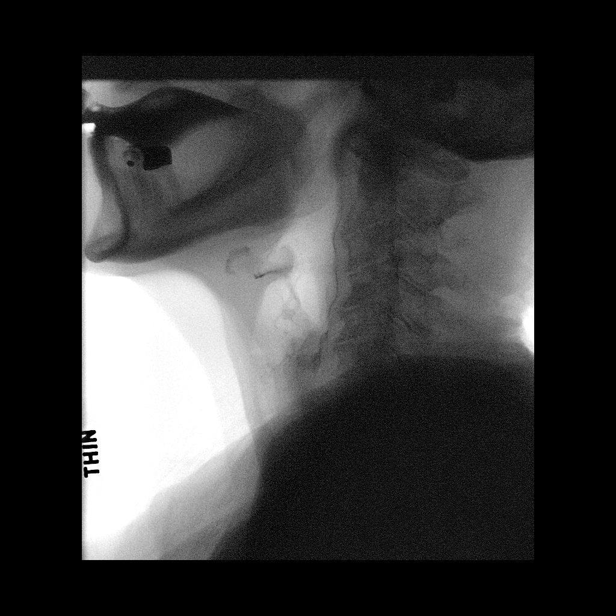

[Series 14: cp_standard · 1 of 18 frames shown (13 of 14)]
[frame 10/18]
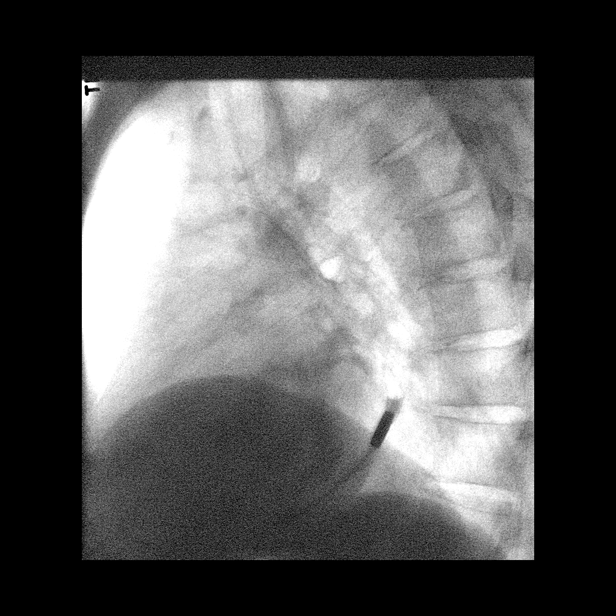

[Series 15: cp_standard · 1 of 18 frames shown (14 of 14)]
[frame 16/18]
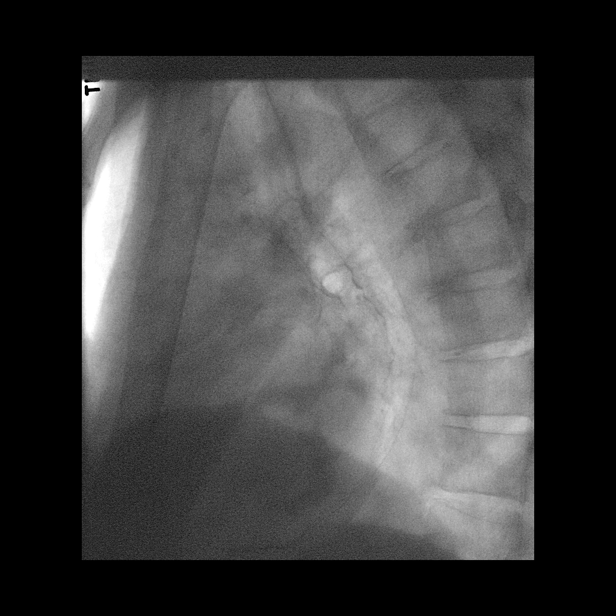

[14 of 24 positions shown; findings below may reference images not displayed]

FINDINGS: Different consistencies of barium were administered orally to the
patient by the Speech Pathologist with fluoroscopic imaging of the
pharynx, as well as limited imaging of the esophagus, from a lateral
projection. The radiologist was present in the fluoroscopy room for
this study, providing personal supervision. The Speech Pathologist
did not observe aspiration. Please refer to the Speech Pathologist's
report for full details.
IMPRESSION: No aspiration identified.

Please refer to the Speech Pathologists report for complete details
and recommendations.
